# Patient Record
Sex: Female | Born: 1935 | Race: White | Hispanic: No | Smoking: Never smoker
Health system: Southern US, Community
[De-identification: ages and names within clinical notes are randomized; demographics above are authoritative.]

## PROBLEM LIST (undated history)

## (undated) DIAGNOSIS — K6389 Other specified diseases of intestine: Secondary | ICD-10-CM

## (undated) DIAGNOSIS — E039 Hypothyroidism, unspecified: Secondary | ICD-10-CM

## (undated) DIAGNOSIS — N390 Urinary tract infection, site not specified: Secondary | ICD-10-CM

## (undated) DIAGNOSIS — T4145XA Adverse effect of unspecified anesthetic, initial encounter: Secondary | ICD-10-CM

## (undated) DIAGNOSIS — M3500 Sicca syndrome, unspecified: Secondary | ICD-10-CM

## (undated) DIAGNOSIS — M069 Rheumatoid arthritis, unspecified: Secondary | ICD-10-CM

## (undated) DIAGNOSIS — R062 Wheezing: Secondary | ICD-10-CM

## (undated) DIAGNOSIS — R112 Nausea with vomiting, unspecified: Secondary | ICD-10-CM

## (undated) DIAGNOSIS — L292 Pruritus vulvae: Principal | ICD-10-CM

## (undated) DIAGNOSIS — Z9889 Other specified postprocedural states: Secondary | ICD-10-CM

## (undated) DIAGNOSIS — R05 Cough: Secondary | ICD-10-CM

## (undated) DIAGNOSIS — G4733 Obstructive sleep apnea (adult) (pediatric): Secondary | ICD-10-CM

## (undated) DIAGNOSIS — I10 Essential (primary) hypertension: Secondary | ICD-10-CM

## (undated) DIAGNOSIS — K5900 Constipation, unspecified: Secondary | ICD-10-CM

## (undated) DIAGNOSIS — N898 Other specified noninflammatory disorders of vagina: Secondary | ICD-10-CM

## (undated) DIAGNOSIS — G629 Polyneuropathy, unspecified: Secondary | ICD-10-CM

## (undated) DIAGNOSIS — I447 Left bundle-branch block, unspecified: Secondary | ICD-10-CM

## (undated) DIAGNOSIS — N952 Postmenopausal atrophic vaginitis: Secondary | ICD-10-CM

## (undated) DIAGNOSIS — R319 Hematuria, unspecified: Secondary | ICD-10-CM

## (undated) DIAGNOSIS — E785 Hyperlipidemia, unspecified: Secondary | ICD-10-CM

## (undated) DIAGNOSIS — R32 Unspecified urinary incontinence: Secondary | ICD-10-CM

## (undated) DIAGNOSIS — R350 Frequency of micturition: Secondary | ICD-10-CM

## (undated) DIAGNOSIS — K2210 Ulcer of esophagus without bleeding: Secondary | ICD-10-CM

## (undated) DIAGNOSIS — N949 Unspecified condition associated with female genital organs and menstrual cycle: Secondary | ICD-10-CM

## (undated) DIAGNOSIS — K219 Gastro-esophageal reflux disease without esophagitis: Secondary | ICD-10-CM

## (undated) DIAGNOSIS — T8859XA Other complications of anesthesia, initial encounter: Secondary | ICD-10-CM

## (undated) HISTORY — DX: Other specified noninflammatory disorders of vagina: N89.8

## (undated) HISTORY — PX: DILATION AND CURETTAGE OF UTERUS: SHX78

## (undated) HISTORY — DX: Cough: R05

## (undated) HISTORY — PX: TONSILLECTOMY: SUR1361

## (undated) HISTORY — DX: Hyperlipidemia, unspecified: E78.5

## (undated) HISTORY — DX: Rheumatoid arthritis, unspecified: M06.9

## (undated) HISTORY — DX: Left bundle-branch block, unspecified: I44.7

## (undated) HISTORY — DX: Hematuria, unspecified: R31.9

## (undated) HISTORY — DX: Unspecified condition associated with female genital organs and menstrual cycle: N94.9

## (undated) HISTORY — DX: Unspecified urinary incontinence: R32

## (undated) HISTORY — DX: Other specified diseases of intestine: K63.89

## (undated) HISTORY — DX: Wheezing: R06.2

## (undated) HISTORY — DX: Obstructive sleep apnea (adult) (pediatric): G47.33

## (undated) HISTORY — DX: Essential (primary) hypertension: I10

## (undated) HISTORY — DX: Sjogren syndrome, unspecified: M35.00

## (undated) HISTORY — PX: APPENDECTOMY: SHX54

## (undated) HISTORY — DX: Hypothyroidism, unspecified: E03.9

## (undated) HISTORY — DX: Pruritus vulvae: L29.2

## (undated) HISTORY — DX: Urinary tract infection, site not specified: N39.0

## (undated) HISTORY — DX: Polyneuropathy, unspecified: G62.9

## (undated) HISTORY — DX: Postmenopausal atrophic vaginitis: N95.2

## (undated) HISTORY — DX: Frequency of micturition: R35.0

---

## 2001-04-25 ENCOUNTER — Other Ambulatory Visit: Admission: RE | Admit: 2001-04-25 | Discharge: 2001-04-25 | Payer: Self-pay | Admitting: Internal Medicine

## 2001-10-24 ENCOUNTER — Ambulatory Visit (HOSPITAL_COMMUNITY): Admission: RE | Admit: 2001-10-24 | Discharge: 2001-10-24 | Payer: Self-pay | Admitting: Internal Medicine

## 2001-10-24 ENCOUNTER — Encounter: Payer: Self-pay | Admitting: Internal Medicine

## 2001-10-27 ENCOUNTER — Ambulatory Visit (HOSPITAL_COMMUNITY): Admission: RE | Admit: 2001-10-27 | Discharge: 2001-10-27 | Payer: Self-pay | Admitting: Internal Medicine

## 2001-10-27 ENCOUNTER — Encounter: Payer: Self-pay | Admitting: Internal Medicine

## 2002-08-27 ENCOUNTER — Ambulatory Visit (HOSPITAL_COMMUNITY): Admission: RE | Admit: 2002-08-27 | Discharge: 2002-08-27 | Payer: Self-pay | Admitting: Gastroenterology

## 2003-03-14 ENCOUNTER — Emergency Department (HOSPITAL_COMMUNITY): Admission: EM | Admit: 2003-03-14 | Discharge: 2003-03-15 | Payer: Self-pay | Admitting: Internal Medicine

## 2003-08-22 ENCOUNTER — Emergency Department (HOSPITAL_COMMUNITY): Admission: EM | Admit: 2003-08-22 | Discharge: 2003-08-22 | Payer: Self-pay | Admitting: Emergency Medicine

## 2005-02-08 ENCOUNTER — Emergency Department (HOSPITAL_COMMUNITY): Admission: EM | Admit: 2005-02-08 | Discharge: 2005-02-09 | Payer: Self-pay | Admitting: *Deleted

## 2005-08-06 ENCOUNTER — Ambulatory Visit (HOSPITAL_COMMUNITY): Admission: RE | Admit: 2005-08-06 | Discharge: 2005-08-06 | Payer: Self-pay | Admitting: Internal Medicine

## 2006-10-04 ENCOUNTER — Emergency Department (HOSPITAL_COMMUNITY): Admission: EM | Admit: 2006-10-04 | Discharge: 2006-10-04 | Payer: Self-pay | Admitting: Emergency Medicine

## 2006-11-21 ENCOUNTER — Emergency Department (HOSPITAL_COMMUNITY): Admission: EM | Admit: 2006-11-21 | Discharge: 2006-11-21 | Payer: Self-pay | Admitting: Emergency Medicine

## 2007-03-21 ENCOUNTER — Emergency Department (HOSPITAL_COMMUNITY): Admission: EM | Admit: 2007-03-21 | Discharge: 2007-03-21 | Payer: Self-pay | Admitting: Emergency Medicine

## 2007-03-22 ENCOUNTER — Inpatient Hospital Stay (HOSPITAL_COMMUNITY): Admission: EM | Admit: 2007-03-22 | Discharge: 2007-03-24 | Payer: Self-pay | Admitting: Emergency Medicine

## 2007-03-26 ENCOUNTER — Ambulatory Visit (HOSPITAL_COMMUNITY): Admission: RE | Admit: 2007-03-26 | Discharge: 2007-03-26 | Payer: Self-pay | Admitting: Family Medicine

## 2007-07-03 ENCOUNTER — Ambulatory Visit (HOSPITAL_COMMUNITY): Admission: RE | Admit: 2007-07-03 | Discharge: 2007-07-03 | Payer: Self-pay | Admitting: Family Medicine

## 2007-08-28 ENCOUNTER — Encounter (HOSPITAL_COMMUNITY): Admission: RE | Admit: 2007-08-28 | Discharge: 2007-09-16 | Payer: Self-pay | Admitting: Internal Medicine

## 2007-09-22 ENCOUNTER — Ambulatory Visit (HOSPITAL_COMMUNITY): Admission: RE | Admit: 2007-09-22 | Discharge: 2007-09-22 | Payer: Self-pay | Admitting: Internal Medicine

## 2007-12-25 ENCOUNTER — Emergency Department (HOSPITAL_COMMUNITY): Admission: EM | Admit: 2007-12-25 | Discharge: 2007-12-25 | Payer: Self-pay | Admitting: Emergency Medicine

## 2008-04-16 ENCOUNTER — Ambulatory Visit: Payer: Self-pay | Admitting: Internal Medicine

## 2008-05-13 ENCOUNTER — Ambulatory Visit: Payer: Self-pay | Admitting: Gastroenterology

## 2008-05-13 ENCOUNTER — Ambulatory Visit (HOSPITAL_COMMUNITY): Admission: RE | Admit: 2008-05-13 | Discharge: 2008-05-13 | Payer: Self-pay | Admitting: Gastroenterology

## 2008-05-17 ENCOUNTER — Ambulatory Visit: Payer: Self-pay | Admitting: Gastroenterology

## 2008-05-17 ENCOUNTER — Ambulatory Visit (HOSPITAL_COMMUNITY): Admission: RE | Admit: 2008-05-17 | Discharge: 2008-05-17 | Payer: Self-pay | Admitting: Gastroenterology

## 2008-05-17 HISTORY — PX: OTHER SURGICAL HISTORY: SHX169

## 2008-05-26 ENCOUNTER — Ambulatory Visit: Payer: Self-pay | Admitting: Gastroenterology

## 2008-06-07 ENCOUNTER — Encounter: Payer: Self-pay | Admitting: Gastroenterology

## 2008-06-07 ENCOUNTER — Ambulatory Visit (HOSPITAL_COMMUNITY): Admission: RE | Admit: 2008-06-07 | Discharge: 2008-06-07 | Payer: Self-pay | Admitting: Gastroenterology

## 2008-06-07 ENCOUNTER — Ambulatory Visit: Payer: Self-pay | Admitting: Gastroenterology

## 2008-06-07 HISTORY — PX: ESOPHAGOGASTRODUODENOSCOPY: SHX1529

## 2008-06-28 ENCOUNTER — Ambulatory Visit: Payer: Self-pay | Admitting: Gastroenterology

## 2008-06-29 ENCOUNTER — Ambulatory Visit (HOSPITAL_COMMUNITY): Admission: RE | Admit: 2008-06-29 | Discharge: 2008-06-29 | Payer: Self-pay | Admitting: Gastroenterology

## 2008-07-12 ENCOUNTER — Ambulatory Visit (HOSPITAL_COMMUNITY): Admission: RE | Admit: 2008-07-12 | Discharge: 2008-07-12 | Payer: Self-pay | Admitting: Internal Medicine

## 2008-08-26 ENCOUNTER — Ambulatory Visit: Payer: Self-pay | Admitting: Gastroenterology

## 2008-10-19 ENCOUNTER — Ambulatory Visit: Payer: Self-pay | Admitting: Cardiology

## 2008-11-12 ENCOUNTER — Ambulatory Visit (HOSPITAL_COMMUNITY): Admission: RE | Admit: 2008-11-12 | Discharge: 2008-11-12 | Payer: Self-pay | Admitting: Internal Medicine

## 2008-12-23 ENCOUNTER — Ambulatory Visit: Payer: Self-pay | Admitting: Gastroenterology

## 2009-02-14 ENCOUNTER — Emergency Department (HOSPITAL_COMMUNITY): Admission: EM | Admit: 2009-02-14 | Discharge: 2009-02-14 | Payer: Self-pay | Admitting: Emergency Medicine

## 2009-04-03 ENCOUNTER — Emergency Department (HOSPITAL_COMMUNITY): Admission: EM | Admit: 2009-04-03 | Discharge: 2009-04-03 | Payer: Self-pay | Admitting: Emergency Medicine

## 2010-11-24 ENCOUNTER — Ambulatory Visit (HOSPITAL_COMMUNITY)
Admission: RE | Admit: 2010-11-24 | Discharge: 2010-11-24 | Payer: Self-pay | Source: Home / Self Care | Attending: Internal Medicine | Admitting: Internal Medicine

## 2011-01-07 ENCOUNTER — Encounter: Payer: Self-pay | Admitting: Internal Medicine

## 2011-02-06 ENCOUNTER — Ambulatory Visit (HOSPITAL_COMMUNITY)
Admission: RE | Admit: 2011-02-06 | Discharge: 2011-02-06 | Disposition: A | Discharge status: Home / Self Care | Payer: MEDICARE | Source: Ambulatory Visit | Attending: Rheumatology | Admitting: Rheumatology

## 2011-02-06 ENCOUNTER — Other Ambulatory Visit (HOSPITAL_COMMUNITY): Payer: Self-pay | Admitting: Rheumatology

## 2011-02-06 DIAGNOSIS — M7120 Synovial cyst of popliteal space [Baker], unspecified knee: Secondary | ICD-10-CM | POA: Insufficient documentation

## 2011-02-06 DIAGNOSIS — M7989 Other specified soft tissue disorders: Secondary | ICD-10-CM | POA: Insufficient documentation

## 2011-02-06 DIAGNOSIS — M79609 Pain in unspecified limb: Secondary | ICD-10-CM | POA: Insufficient documentation

## 2011-02-11 ENCOUNTER — Emergency Department (HOSPITAL_COMMUNITY)
Admission: EM | Admit: 2011-02-11 | Discharge: 2011-02-11 | Disposition: A | Discharge status: Home / Self Care | Payer: MEDICARE | Attending: Emergency Medicine | Admitting: Emergency Medicine

## 2011-02-11 DIAGNOSIS — E7800 Pure hypercholesterolemia, unspecified: Secondary | ICD-10-CM | POA: Insufficient documentation

## 2011-02-11 DIAGNOSIS — I10 Essential (primary) hypertension: Secondary | ICD-10-CM | POA: Insufficient documentation

## 2011-02-11 DIAGNOSIS — Z79899 Other long term (current) drug therapy: Secondary | ICD-10-CM | POA: Insufficient documentation

## 2011-02-11 LAB — URINALYSIS, ROUTINE W REFLEX MICROSCOPIC
Ketones, ur: NEGATIVE mg/dL
Protein, ur: NEGATIVE mg/dL
Specific Gravity, Urine: 1.015 (ref 1.005–1.030)
Urine Glucose, Fasting: NEGATIVE mg/dL

## 2011-05-01 NOTE — Op Note (Signed)
NAME:  Jo Parrish, Jo Parrish                 ACCOUNT NO.:  [id number]   MEDICAL RECORD NO.:  [id number]          PATIENT TYPE:  AMB   LOCATION:  DAY                           FACILITY:  [facility]   PHYSICIAN:  Kassie Mends, M.D.      DATE OF BIRTH:  1926-06-04   DATE OF PROCEDURE:  06/07/2008  DATE OF DISCHARGE:                               OPERATIVE REPORT   REFERRING PHYSICIAN:  [name]. Fusco, MD   PROCEDURE:  Esophagogastroduodenoscopy with cold forceps biopsy of the  gastric and then duodenal mucosa.   INDICATION FOR EXAMINATION:  Jo Parrish is a 77 year old lady who presents  with persistent nausea.  Her nausea did not respond to Nexium, Carafate,  Prevacid, HyoMax, hydroxyzine or Prilosec.  She had hydrogen breath test  which was consistent with small bowel bacterial overgrowth.  She had  symptomatic improvement with Cipro and Flagyl, but did not have complete  resolution of her symptoms.  She still continues to complain of nausea  and abdominal pain.   FINDINGS:  1. Linear erosions seen in the distal esophagus beginning      approximately at 37 cm from the teeth and extending to the GE      junction at 40 cm from the teeth.  No Barrett mass or stricture      seen.  2. Small hiatal hernia.  Patchy erythema in the antrum without erosion      or ulceration.  Biopsies obtained via cold forceps to evaluate for      H. pylori gastritis or eosinophilic gastritis.  3. Normal duodenal bulb and the second portion of the duodenum.      Biopsies obtained via cold forceps to evaluate for celiac sprue.   DIAGNOSIS:  Jo Parrish epigastric discomfort and nausea or likely  related to esophagitis and gastritis.   RECOMMENDATIONS:  1. We will Kapidex once daily.  2. We will call Jo Parrish with the results of her biopsies.  3. She may resume her previous diet and is given information on reflux      and gastritis.  4. No aspirin or NSAIDs for 5 days.  5. Followup appointment in 1 month with Dr. Cira Servant  regarding nausea.   MEDICATIONS:  1. Demerol 75 mg IV.  2. Versed 3 mg IV.   PROCEDURE TECHNIQUE:  Physical exam was performed.  Informed consent was  obtained from the patient explaining the benefits, risks and  alternatives to the procedure.  The patient was connected to monitor and  placed in the left lateral position.  Continuous oxygen was provided by  nasal cannula, IV medicine administered through an indwelling cannula.  After administration of sedation, the patient's esophagus was intubated  and the scope was advanced under direct visualization to the cecum.  The  scope was removed slowly by carefully examining the color,  texture, anatomy, and integrity of the mucosa on the way out.  The  patient was recovered in endoscopy and discharged home in satisfactory  condition.   PATH:  Gastritis. No celiac sprue      Kassie Mends, M.D.  Electronically Signed     SM/MEDQ  D:  06/07/2008  T:  06/07/2008  Job:  [id number]   cc:   [name], MD  Fax: [phone]

## 2011-05-01 NOTE — Consult Note (Signed)
NAME:  Jo Parrish, Jo Parrish                 ACCOUNT NO.:  [id number]   MEDICAL RECORD NO.:  [id number]          PATIENT TYPE:  AMB   LOCATION:  DAY                           FACILITY:  [facility]   PHYSICIAN:  Kassie Mends, M.D.      DATE OF BIRTH:  Jun 24, 1926   DATE OF CONSULTATION:  05/26/2008  DATE OF DISCHARGE:                                 CONSULTATION   REFERRING PHYSICIAN:  [name], MD.   PROBLEM LIST:  1. Chronic nausea.  2. Chronic salty taste.  3. Probable Sjogren syndrome.  4. Hypertension.  5. Thyroid disease.  6. Appendectomy.   SUBJECTIVE:  Jo Parrish is a 77 year old female who was seen and evaluated  for her nausea in May 2009.  Hydrogen breath test was performed and it  was positive.  She was asked to take Cipro and Flagyl 500 mg b.i.d. for  5 days.  She finished her Augmentin and initially felt well, but  continues to have problems with nausea and that is relieved with  Phenergan or chewing sugarless gum.  Last endoscopy in October 2008 was  by Dr. Karilyn Cota and he did not biopsy her gastric mucosa.  She had a C-  reactive protein in May 2009, which was 0.1.  She presents as a return  patient visit today because she said her nausea is not any better.  She  complained of constipation and took 1 stool softener (docusate sodium)  and it caused severe abdominal cramping.  She also was very nauseous.  She has not had any vomiting.  She complains of feeling bloated in her  abdomen and her stomach feeling hard as a rock.  She also complains of  her tongue being sore and white and thinks she has thrush.   MEDICATIONS:  1. Enalapril.  2. Levothyroxine.  3. Atenolol as needed for palpitations.  4. Phenergan as needed.  5. Alprazolam as needed.  6. Fish oil.  7. Super B-Complex.   OBJECTIVE:  VITAL SIGNS:  Weight 142 pounds, height 5 feet 3 inches,  temperature 98,  blood pressure 100/80, pulse 64.  GENERAL:  She is in no apparent distress.LUNGS:  Clear to auscultation  bilaterally.CARDIOVASCULAR:  Regular rate and rhythm.  No  murmur.ABDOMEN:  Bowel sounds are present and soft.  She has marked her  abdomen with ballpoint pen showing me where her pain is.  She has mild  tenderness to palpation in the left upper quadrant without rebound or  guarding.   ASSESSMENT:  Jo Parrish is a 77 year old female who seems to perseverate  on her nausea and her abdominal discomfort.  The differential diagnosis  for her symptoms still includes bacterial overgrowth, and after her  course of antibiotic therapy, she has not given enough time to determine  whether or not she is going to have a therapeutic effect.  She also has  some dietary issues that may be contributing to her bloating and nausea.  I believe she is very sensitive to the distension of her intestines.  Thank you for allowing me to see Jo Parrish in consultation.  My  recommendations follow.   RECOMMENDATIONS:  1. She may use nystatin 1 mL 4 times a day for 10 days.  She should      swish it and spit it out.  2. May begin Align daily.  She should use Metamucil daily.  3. She is asked to avoid sugarless gum and artificial sweeteners such      as Splenda or Equal.  She is given discharge instructions in      writing.  4. She may use milk of magnesia as needed for constipation.  5. She will be scheduled for an upper endoscopy within the next 2      weeks and we will cancel it if her symptoms are improved after the      initiation of diet modification, Metamucil, and Align.  6. She is given a prescription for Phenergan and asked to take 1/4 to      1 every 6 hours as needed for nausea and vomiting.  She is also      given a hand out on items that may cause bloating.  7. She may follow up to see me in 1 month.      Kassie Mends, M.D.  Electronically Signed     SM/MEDQ  D:  05/26/2008  T:  05/27/2008  Job:  [id number]   cc:   [name], MD  Fax: [phone]

## 2011-05-01 NOTE — Letter (Signed)
October 19, 2008    Jo Rear. Sherwood Gambler, MD  [address]  [place], [place] [address]   RE:  Jo Parrish, Jo Parrish  MRN:  [id number]  /  DOB:  02-25-1926   Dear Peyton Najjar,   [name] was seen in the office today at her request.  As you know, she  was previously followed by [facility], but wish to transfer  her care to our practice.  She reports that she has no known cardiac  disease.  She has been told of a heart murmur.  Testing was performed at  [facility], but those records have not yet been provided to [place].  She  was advised to seek evaluation by a cardiologist by her dermatologist,  Dr. Margo Aye.  She has had ill-defined autoimmune disease in the past.  Evaluation by Dr. Kellie Simmering was apparently negative.  More recently, she  has had GI complaints, worked up here and at [facility] without a specific  etiology being identified.  She was apparently told at [facility] that she  has a motility disorder.  She has had mild hypertension that has been  adequately controlled with a single agent.  She has moderate  hyperlipidemia that initially was controlled with a statin.  More  recently, she has been unable to tolerate multiple drugs including  atorvastatin, simvastatin, and rosuvastatin.  She is afraid to take  ezetimibe.  Nonetheless, she wants her cholesterol to be lowered into  the normal range.   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  The only surgery  she has undergone is a remote appendectomy.   FAMILY HISTORY:  Father died at age 22 with a history of hepatic and  pulmonary problems.  The mother died at age 32 due to CVA.   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  She is widowed  with one child.   REVIEW OF SYSTEMS:  Notable for the need for corrective lenses for near  vision, status post bilateral cataract extraction, history  of minor  palpitations, history of multiple GI complaints including nausea,  abdominal pain, excessive gas, and heartburn.  She has arthritic  discomfort in both knees.  She report some pain in the calves of her  legs, but this does not clearly represent claudication.  All other  systems reviewed and are negative.   PHYSICAL EXAMINATION:  GENERAL:  Garrulous pleasant woman in no acute  distress.  VITAL SIGNS:  The weight is 146, blood pressure 125/70, heart rate 90  and regular, respirations 14.  HEENT:  EOMs full; pupils equal, round, and reactive to light; normal  lids and conjunctivae; normal oral mucosa.  NECK:  No jugular venous distention; normal carotid upstrokes without  bruits.  ENDOCRINE:  No thyromegaly.  HEMATOPOIETIC:  No adenopathy.  SKIN:  No significant lesions.  LUNGS:  Clear.  CARDIAC:  Normal first and second heart sounds; modest systolic ejection  murmur; normal PMI.  ABDOMEN:  Soft and nontender; normal bowel sounds; no masses; no  organomegaly.  EXTREMITIES:  No edema; normal distal pulses.  NEUROLOGIC:  Symmetric strength and tone; normal cranial nerves.   EKG:  Normal sinus rhythm; left bundle-branch block; borderline left  atrial abnormality.   Laboratory available from October reveals a total cholesterol 267,  triglycerides 216, HDL 47, and LDL of 177.  TSH was 0.28.  AM  cortisol  was normal.  CBC was normal.  Sed rate was normal.  Metabolic profile  was normal.   IMPRESSION:  Ms. Deadwyler has a benign sounding systolic ejection murmur.  She has hypertension that is well controlled.  She has moderately severe  hyperlipidemia that will not be controlled as long as she is unable to  take statin medications.  I explained her that at her age, the need to  control hyperlipidemia aggressively is somewhat mitigated.  She will try  ezetimibe  plus dietary measures, including oat bran and margarine with plant  sterols.  We discussed her left bundle-branch block  and the likelihood  that does not reflect significant cardiac disease nor will it have  important implications for her.  I will assemble records from  [facility] and plan to see this nice woman  again in 6 months.    Sincerely,      Gerrit Friends. Dietrich Pates, MD, [facility]  Electronically Signed    RMR/MedQ  DD: 10/19/2008  DT: 10/19/2008  Job #: [id number]

## 2011-05-01 NOTE — Op Note (Signed)
NAME:  Jo Parrish, Jo Parrish                 ACCOUNT NO.:  [id number]   MEDICAL RECORD NO.:  [id number]          PATIENT TYPE:  AMB   LOCATION:  DAY                           FACILITY:  [facility]   PHYSICIAN:  Kassie Mends, M.D.      DATE OF BIRTH:  05/04/1926   DATE OF PROCEDURE:  05/17/2008  DATE OF DISCHARGE:  05/17/2008                               OPERATIVE REPORT   REFERRING Cam Dauphin:  [name]. Fusco, MD   PROCEDURE:  Hydrogen breath test for small bowel bacterial overgrowth.   INDICATION FOR EXAM:  Ms. Gaughran is a 77 year old lady who presented with  nausea, vomiting, and bloating.  She had an upper endoscopy in October  2008 by Dr. Karilyn Cota, which showed a small hiatal hernia.  She had an  extensive GI workup to include a 4-hour gastric emptying study with  [facility], which was normal.  She also  complains of mild constipation and bloating.  Xanax was held with her  symptoms.  She denies diarrhea.  The CT scan of her head was normal.   PRETEST CHECK:  Ms. Sligh presented with nausea and had to be brought to  the testing area in a wheelchair.  She did not have any beans, bran, or  high-fiber within the past 24 hours.  She had been n.p.o. for at least  12 hours.  She denied smoking, sleeping, or exercising within 30 minutes  of the exam.  She was not taking any antibiotics and did not have any  diarrhea.  She stated when the lactose hit her stomach, she got instant  relief from her nausea.   RESULTS:  At 30 minutes, the breathalyzer read 15 parts per million.  At  90 minutes, the breathalyzer read 48 parts per million.  She had a drop  of 36 parts per million at 105 minutes.  She again had a peak at 120  minutes with 43 parts per million and then the test was discontinued at  135 minutes with the breathalyzer measuring 41 parts per million.   DIAGNOSIS:  Hydrogen level rise consistent with small bowel bacterial  overgrowth.   RECOMMENDATIONS:  The patient is  unable to take Keflex.  She also states  that Augmentin causes her tongue to hurt.  She was given a prescription  for Cipro 500 mg b.i.d. and Flagyl 500 mg b.i.d. for 5 days.  She will  call me and let me know if her symptoms are improved.  Ms. Seedorf already  has a followup appointment to see me in 2 months.      Kassie Mends, M.D.  Electronically Signed    SM/MEDQ  D:  05/20/2008  T:  05/21/2008  Job:  [id number]   cc:   [name], MD  Fax: [phone]

## 2011-05-01 NOTE — Assessment & Plan Note (Signed)
NAME:  Jo Parrish, Jo Parrish                  CHART#:  [id number]   DATE:  06/28/2008                       DOB:  September 15, 1926   REFERRING PHYSICIAN:  [name], MD   PRIMARY CARDIOLOGIST:  Dani Gobble, MD   PROBLEM LIST:  1. Persistent nausea  2. Hyperlipidemia.  3. Hydrogen breath test in June 2009, which was consistent with small      bowel bacterial overgrowth.  4. Mild gastritis on biopsies via EGD in June 2009.  5. Hypertension.   SUBJECTIVE:  The patient is a 77 year old female who was initially seen  by me in May 2009.  She was complaining of nausea and tongue pain.  She  has had an extensive evaluation, which has revealed no specific etiology  for her nausea.  She additionally stated that she had some improvement  with her symptoms after taking Cipro and Flagyl.  After she was given a  repeat course in July 2009, stated it made her sick.  She states she  cannot take Reglan because that makes her sicker.  She states she cannot  take Elavil because it makes her drowsy.  Her insurance company would  not approve Kapidex and told her to take omeprazole.  Xanax may have  helped.  Kenalog set off an episode of nausea yesterday.  She thinks  Lipitor did this to me.  She complains of decreased appetite, but her  weight has been stable since 1985.   MEDICATIONS:  1. Enalapril.  2. Levothyroxine.  3. Atenolol.  4. Phenergan as needed.  5. Xanax as needed.  6. Fish oil 1200 mg 4 times a day.  7. Mylanta as needed.   OBJECTIVE:  VITAL SIGNS:  Weight 141 pounds, height 5 feet 3 inches, BMI  25 (healthy), temperature is 97.1, blood pressure 130/80, and pulse  72.GENERAL:  She is in no apparent distress.  Alert and oriented  x4.HEENT:  Atraumatic and normocephalic.  Pupils, equal and reactive to  light.MOUTH:  No oral lesions.  Posterior pharynx without erythema or  exudate.LUNGS:  Clear to auscultation bilaterally.CARDIAC:  Regular  rhythm with no murmur.NECK:  Full range of  motion.  No lymphadenopathy.  ABDOMEN:  Bowel sounds are present, soft, and mild tenderness to  palpation in the bilateral flanks without rebound or  guarding.NEUROLOGIC:  She has no focal neurological deficits.   ASSESSMENT:  The patient is a 77 year old female who has persistent  nausea and mild abdominal pain.  She likely has a functional  gastroesophageal reflux disease disorder, but the differential diagnosis  includes atypical mesenteric ischemia.  Thank you for allowing me to see  the patient in consultation.  My recommendations follow.   RECOMMENDATIONS:  1. The patient declined a referral to [facility] for      additional evaluation.  2. Will add imipramine 10 mg nightly.  Hopefully, she can tolerate      this dose.  3. She is encouraged to use the Xanax as often as she needs it.  4. Will obtain a CT angiography of the abdomen to evaluate for celiac      axis or superior mesenteric artery axis stenosis as an etiology for      her persistent nausea.  5. She has a return patient visit to see me in 1 month.  PATH:  CTA-no stenois of Celiac, SMA, or IMA axis.       Kassie Mends, M.D.  Electronically Signed     SM/MEDQ  D:  06/28/2008  T:  06/29/2008  Job:  [id number]   cc:   Dani Gobble, MD  [name], MD

## 2011-05-01 NOTE — Assessment & Plan Note (Signed)
NAME:  Jo Parrish, Jo Parrish                  CHART#:  [id number]   DATE:  08/26/2008                       DOB:  07-03-1926   REFERRING PHYSICIAN:  [name], MD.   PRIMARY CARDIOLOGIST:  Dani Gobble, MD.   PROBLEM LIST:  1. Persistent nausea.  2. Hyperlipidemia.  3. Hydrogen breath test in June 2009 consistent with small bowel      bacterial overgrowth.  4. Mild gastritis on EGD in June 2009.  5. Hypertension.   SUBJECTIVE:  The patient is an 77 year old female who came to me  complaining of persistent nausea.  She has had an extensive workup to  include CTA, which showed no evidence of stenosis of the SMA, IMA, or  celiac axis.  She continues to complain of nausea when she takes any  medications that I prescribed.  She has been tried on many medicines.  She said Reglan made her sicker.  Cipro and Flagyl initially worked, and  then made her ill.  She felt like omeprazole did not help.  Xanax does  help.  She thinks Lipitor caused all of her problems.  She said she felt  good for 2 weeks in August.  On August 09, 2008, she became sick.  When  her stomach rolls she takes Xanax.  Her nausea does not seem to be  related to what she eats.  She also marked her stomach where she feels  pain with an ink pen.   MEDICATIONS:  1. Enalapril.  2. Levothyroxine.  3. Atenolol.  4. Xanax as needed.   OBJECTIVE:  PHYSICAL EXAM:  VITAL SIGNS:  Weight 142 pounds (unchanged since May 2009), height 5  feet 3 inches, temperature 97.5, blood pressure 126/72, and pulse  76.GENERAL:  She is in no apparent distress.  Alert and oriented  x4.LUNGS:  Clear to auscultation bilaterally.CARDIOVASCULAR:  Regular  rhythm.ABDOMEN:  Bowel sounds are present.  Soft, nontender, and  nondistended.  She does have itch on her abdomen.   ASSESSMENT:  The patient is an 77 year old female who presents as a  return patient visit.  She continues to complain of persistent nausea,  but is unable to take medications  to help with her symptoms.  She used  to take those medications, which caused her symptoms to be worse.  Thank  you for allowing me to see the patient in consultation.  My  recommendations follow.   RECOMMENDATIONS:  1. Recommend the patient to try her Zofran as needed for nausea.  I      really did not have any other workup to offer her today or any      other medications to offer.  2. She has a follow up appointment to see me in 3 months.  She can see      me sooner if she needs to.  3. I recommend that she is to see Dr. Kellie Simmering for her knee pain.  She      also was concerned as to whether or not she could have amyloidosis.      She does not have any signs or symptoms of amyloidosis.        Kassie Mends, M.D.  Electronically Signed     SM/MEDQ  D:  08/26/2008  T:  08/27/2008  Job:  [id number]   cc:  [name], MD

## 2011-05-01 NOTE — Assessment & Plan Note (Signed)
NAME:  Jo Parrish, Jo Parrish                  CHART#:  [id number]   DATE:  05/13/2008                       DOB:  1926/09/07   REFERRING PHYSICIAN:  [name], M.D.   PROBLEM LIST:  1. Chronic nausea.  2. Chronic tongue pain, thought possibly secondary to Sjogren's      syndrome.  3. Hypertension.  4. Hypothyroidism.  5. Hyperlipidemia.  6. Appendectomy.   SUBJECTIVE:  Jo Parrish is a 77 year old female who was initially seen in  May 2009 by Tana Coast.  Several records were requested for review due  to the patient's complaint of nausea and vomiting, which has been  episodic.  She reports having these symptoms since February 2008.  She  remembers the first episode happened, and then she was sent to  [facility] for a PET scan, and she said she was cancer-free.  Then she  had an episode were she had nausea, and it was really bad.  In August  2008 her symptoms recurred.  She also had an episode when she was in  [place] after she ate cheesecake and she was treated for her symptoms  with Phenergan and then the next day she could not eat.  In January 2009  the same thing, she had the nausea and the vomiting.  This time she was  in [facility], and they gave her a shot and she got better.  She has  also been seen at [facility] and had  an endoscopic ultrasound as well as a 4-hour gastric emptying study,  which was normal.  She was also seen and evaluated by Dr. Dionicia Abler in  October 2008, and he did an EGD, which was absolutely normal.  He asked  her to take Nexium and Carafate, which she said did not help her  symptoms.  Also her Lipitor had been discontinued because she thought  perhaps that was contributing to her nausea.  She had an ultrasound in  January 2009, which was normal.  She had an upper GI with small-bowel  follow-through in October 2008, which just showed a small hiatal hernia.  She had a HIDA scan at [facility] within the last year, which  showed a  gallbladder ejection fraction of 57.9%.  She was seen in followup with  Dr. Dionicia Abler in January 2009 and Dr. Dionicia Abler felt that these were  psychosomatic symptoms.  He gave her Xanax.  The CT scan of her abdomen  and pelvis that she had in August 2008 just showed mild uncomplicated  diverticulosis.  The films are not available for me to review.  When she  was seen by [place] in May, she had a liver profile which was normal and an  a.m. cortisol level which was normal.  She had a normal TSH in February  2009.  She had a colonoscopy for screening purposes in 2003 with Dr.  Griffith Citron in [place].   She has been tried on Prilosec, hydroxyzine, HyoMax, Carafate, and  Prevacid without any change in her symptoms.  She states that she since  she has been on the Xanax the attacks are further apart.  She denies  that her symptoms correlate with anxiety.  She has had palpitations in  the past.  She is scared to eat because she might get  sick.  Her weight  was 142 to 143 pounds in 2000 and measured at 150 pounds in 2007.  Today  she is 143 pounds.  She denies any vertigo, disequilibrium, posterior  nasal drainage, headaches, hearing loss, pain in her ears or any  spinning sensation when she has the nausea and the vomiting.  She said  she has had some mild change in her vision in July 2008.  She also says  when she has these symptoms she gets ringing in her ears.  She has not  had a hearing test.  Her last eye exam was at least 2 years ago.  Sometimes she gets pressure in the back of her eyes, which are mild  sinus symptoms.  She also has some mild nasal congestion.  She denies  any heartburn, indigestion, problems swallowing, rectal bleeding,  diarrhea.  She has some mild constipation and bloating.  She said  laxatives make her sick just like when she has her episodes of nausea  and vomiting.  When she has these episodes of nausea and vomiting she  does have the urge to defecate but does  not.  The symptoms hit her all  of a sudden and usually last 4 to 6  hours.  She said until she started  the Xanax her symptoms were basically all day.  She denies eating any  milk or cheese.  She has never had a history of cerebrovascular accident  or myocardial infarction. She denies any history of tobacco use ever or  alcohol use.   MEDICATIONS:  1. Enalapril.  2. Levothyroxine.  3. Atenolol as needed for palpitation.  4. Phenergan as needed (less than once a month).  5. Alprazolam 0.25 mg t.i.d. (using 1 time a week).  6. Fish oil.  7. Super B complex.   OBJECTIVE:  VITAL SIGNS:  BMI 25.3 (slightly overweight), temperature  97.8,  blood pressure 120/90, pulse 76.  GENERAL:  She is in no apparent  distress.  Alert and oriented x4.  She is very talkative.  HEENT:  Atraumatic, normocephalic.  Pupils equal and reactive to light.  Mouth:  No oral lesions.  Posterior pharynx without erythema or exudate.  NECK:  Full range of motion, no lymphadenopathy.  LUNGS:  Clear to auscultation  bilaterally.  CARDIOVASCULAR:  Regular rhythm.  No murmur.ABDOMEN:  Bowel sounds are present, soft, nontender, nondistended, no abdominal  bruits.  EXTREMITIES:  No cyanosis or edema.  NEUROLOGIC:  She has no  focal deficits.   ASSESSMENT:  Jo Parrish is a 77 year old female who complains of episodic  nausea and vomiting.  She has already had an extensive negative workup.  The things that have not been evaluated, that remain in the differential  diagnosis include atypical gastroesophageal reflux disease, small bowel  bacterial overgrowth causing bowel distention and vomiting, an acute  intracranial process, and a low likelihood of chronic mesenteric  ischemia, or intermittent small bowel obstruction.  She may very well  have a psychosomatic component to her symptoms.   Thank you for allowing me to see Jo Parrish in consultation.  My  recommendations follow.   RECOMMENDATIONS:  1. Jo Parrish will be  scheduled for a hydrogen breath test to check for      small bowel bacterial overgrowth.  She will also be scheduled for a      CT of her head.  If the CT and HBT are negative, then would      schedule a CT angiogram to  look at the vasculature of her abdomen.      If that is negative, then I would proceed with a small bowel CT      enterography.  If all of those tests are negative, then there would      be no further GI evaluation tools to evaluate her for nausea and      vomiting, and I would recommend a referral to a tertiary care      center.  2. She is asked to follow a low-fat diet.  3. She has a follow up appointment to see me in 2 months.  I will      contact her by phone in regards to her hydrogen breath test, her CT      scan and subsequent evaluation.  4. Once the hydrogen breath test and the CT scan of the head are      complete, then we consider Kapidex.  She will be given samples for      a 30-day supply.  5. She is asked to continue the use of Xanax since she has had some      symptomatic relief from that.   ADDENDUM: [id number]:  Scheduled for EGD with biopsies. Pt did not show.       Kassie Mends, M.D.  Electronically Signed     SM/MEDQ  D:  05/13/2008  T:  05/13/2008  Job:  [id number]   cc:   [name], MD

## 2011-05-01 NOTE — Assessment & Plan Note (Signed)
NAME:  Jo Parrish, Jo Parrish                  CHART#:  [id number]   DATE:  04/16/2008                       DOB:  1926/11/08   CHIEF COMPLAINT:  Chronic nausea and abdominal pain.   PRIMARY CARE PHYSICIAN:  [name], MD   HISTORY OF PRESENT ILLNESS:  Ms. Jo Parrish is a 77 year old Caucasian female  who presents today as a self-referral for chronic nausea.  Notably this  patient has been seen here locally by Dr. Lionel December since October of  2008 and has been seen at [facility] at least for a  couple of tests.  I am not sure if she has been seen in the GI clinic  however.  She states that she has had chronic nausea and epigastric  discomfort for several months.  She relates to beginnings of these  symptoms when she developed nausea, vomiting, diarrhea when she was in  [facility].  This was back around August of last year.  She states she  was told she had vertigo when she visited a local facility there.  She  states her vomiting and diarrhea had resolved spontaneously but she  continues to have epigastric pain and chronic nausea and inability to  eat.  She has tried multiple medications and had multiple studies with  really no real benefit.  She continues to have a growling type pain in  her stomach.  She states she has nausea intermittently.  She states she  actually has felt well the last couple of days.  She denies any change  in her bowel movements.  She generally has regular stools without blood  or diarrhea.  She consumes a high fiber diet which keeps her regular.  She says she had about an 8 pound weight loss since December of 2008  which she relates to the fact that she feels like she cannot eat.  Her  symptoms do seem to be worse postprandially.  She has tried Prilosec,  Tums, Nexium, Prevacid all without any relief.  She tried holding her  Lipitor and Crestor.  She states she was on Lipitor for years.  She  feels like it has really messed her up.  She has  not really noticed any  improvement of her symptoms with holding the medications.  She has tried  sucralfate tablets, hydroxyzine, HyoMax all without results.  Recently  she saw her PCP, Dr. Sherwood Gambler, and was given Reglan and she states after  taking one 5 mg tablet she had extreme bloating and trapped gas and just  felt miserable and she states she will never take it again.  She has  used intermittent Phenergan with relief.  She denies any dysphagia or  odynophagia.  She states that she does have a chronic problem with her  tongue.  At times it feels like it is very sore.  She states she has a  salty taste at all times.  She recalls that over 30 years ago that she  was told she has Sjogren's syndrome as well as rheumatoid arthritis and  lupus when she was seen at [facility].  She states she subsequently was seen at [facility] and by Dr. Kellie Simmering  most recently and states the last set of blood work was negative.  She  says she never  really underwent any treatment for it.  She states she  was told she had a mild case.  She has had problems with her tongue with  a salty taste and soreness ever since that time.  She had a colonoscopy  by Dr. Kinnie Scales in September of 2003 which was negative.  She had an  abdominal ultrasound in January of 2009 negative.  HIDA scan September  of 2008 which was normal with EF of 57.9%.  The patient reports no  aggravation or reproduction of her symptoms.  She had upper GI series  with small bowel follow-through October of 2008 which was normal except  for a small hiatal hernia and age related impairment of esophageal  motility.  She reports having abdominal pelvic CT but I have not found  that report.  She had a gastric emptying study 4 hours, done at [facility] within the last couple of  months which revealed an accelerated 4 hour study.  She had an EUS with  by Dr. Othelia Pulling in February of 2009 at  [facility] which was normal.  She  had electrogastrogram by Dr. 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  She has been Hemoccult negative back  in September of 2008 by Dr. Stacie Glaze.  Labs in January of 2009 revealed an  SGOT of 53, SGPT of 62, possibly on a statin at that time.  Her  creatinine was normal at 0.8, hemoglobin normal at 13.7.  White count  6400, platelets 236,000.  She was treated for a mild UTI because of her  complaints of dysuria.  She had a TSH level in February of 2009 which  was normal at 0.847.   CURRENT MEDICATIONS:  1. Enalapril 10 mg daily.  2. Levothyroxine 75 mcg daily  3. Atenolol 25 mg p.r.n. but not taking currently.  4. Phenergan p.r.n.   ALLERGIES:  Antibiotics in general causes her mouth to be sore.   PAST MEDICAL HISTORY:  Hypertension, hypothyroidism,  hypercholesterolemia.  She reports a history of Sjogren's syndrome,  rheumatoid arthritis and lupus diagnosed years ago but has not been on  any treatment and states that followup workup by Dr. Kellie Simmering in  [place] was negative.  She has had a prior appendectomy, colonoscopy  as above, EGD as above.   FAMILY HISTORY:  Mother deceased at age 62, has history of stroke.  Father deceased age 18, died of liver and lung problems and states he  just give out.  No family history of colorectal cancer.   SOCIAL HISTORY:  She is widowed.  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  She  has never smoked cigarettes, does not drink alcohol.  She has 1 child.   REVIEW OF SYSTEMS:  See HPI for GI and constitutional.  Cardiopulmonary:  Denies chest pain or shortness of breath.  GI:  See HPI.  GU:  Denies  any dysuria or hematuria.   PHYSICAL EXAMINATION:  VITAL SIGNS:  Weight 143, height 5 feet 3 inches,  temperature 98.1, blood pressure 110/68, pulse 64.  GENERAL:  A pleasant, elderly Caucasian female in no acute distress.   SKIN:  Warm and dry.  No jaundice.  HEENT:  Sclerae nonicteric.  Oropharyngeal mucosa moist and pink.  No  lesions, erythema or exudate.  No lymphadenopathy or thyromegaly.  CHEST:  Lungs are clear to auscultation.  CARDIOVASCULAR:  Reveals regular rate and rhythm.  Normal S1-S2.  No  murmurs, rubs or gallops.  ABDOMEN:  Positive bowel sounds.  Abdomen soft, obese, nontender,  nondistended.  No organomegaly or masses.  No rebound or guarding.  No  abdominal bruits or hernias.  LOWER EXTREMITIES:  No edema.   LABS AND X-RAYS:  As above.   IMPRESSION:  Ms. Tamargo is a 81-year lady who has had an extensive  evaluation as outlined above who presents today as a self-referral for  chronic ongoing nausea and epigastric discomfort.  She has tried  multiple proton pump inhibitors without relief.  She has tried  withholding certain medications without relief.  She tells me today that  she believes her symptoms are all related to greasy, fatty foods which  she has held the last 2 days and has felt great.  I am afraid that this  may be a short lived improvement in which she has demonstrated  intermittent bouts of feeling well in the past.  Unfortunately she has  not taken certain suggested treatments for any significant length of  time.  She refuses to try Reglan after one dose stating that it made her  feel worse.  She has not tried Xanax which was given to her previously  as it was felt that some of her symptoms may be psychogenic.  I am still  trying to complete her medical database and retrieve a couple more  records that we do not have for our review.  Unfortunately I am not sure  that we are going to have much more that we can add at this point as a  cause of her nausea and epigastric discomfort.   PLAN:  1. We need to obtain a copy of her CT of abdomen and pelvis for      further review.  2. Retrieve records from Dr. Kellie Simmering specifically regarding her      Sjogren's syndrome.  3. Will  repeat her LFTs given that they were elevated previously.  4. Will check a fasting a.m. cortisol level.  5. No medical regimen change at this time.  6. She will need to have followup with Dr. Kassie Mends in the next few      weeks.       Tana Coast, P.A.  Electronically Signed     R. Roetta Sessions, M.D.  Electronically Signed    LL/MEDQ  D:  04/16/2008  T:  04/16/2008  Job:  [id number]   cc:   [name], MD

## 2011-05-01 NOTE — Assessment & Plan Note (Signed)
NAME:  Jo Parrish, Jo Parrish                  CHART#:  [id number]   DATE:  12/23/2008                       DOB:  July 03, 1926   REFERRING PHYSICIAN:  [name], MD   PRIMARY CARDIOLOGIST:  Gerrit Friends. Dietrich Pates, MD, [facility]   PROBLEM LIST:  1. Persistent nausea and abdominal complaints likely secondary to      functional gut disorder.  2. Hyperlipidemia.  3. Hydrogen breath test consistent with small-bowel bacterial      overgrowth.  4. Mild gastritis on EGD in June 2009.  5. Hypertension.   SUBJECTIVE:  The patient is a 77 year old female who was seen in  September 2009.  She was complaining of consistent nausea, but is unable  to take any medications.  She stated that the medications that I have  tried made symptoms worse.  I recommended Zofran.  She is supposed to  follow up with me in 3 months.  She continues to believe that she has  Sjogren syndrome.  She spoke with Dr. Nita Sells who ordered a B12,  folate, Sjogren A, Sjogren B as well as a blood sugar.  She has been  complaining of feeling like she is going to pass out if she does not  eat.  She is also complaining of feeling off balance when she had been  dizzy.  She states cheese and sweets are not good for her.  She is on  oxygen at nighttime.  She has just finished Cipro for a bladder  infection.  She said it made her mouth burn and then she got thrush and  then she got yeast.  She has tried to take a vitamin B and folate  complex, but she said it made her sick and nauseous.  The Xanax helps  her symptoms.  She is using 3-4 a day.  Her weight has increased 8  pounds since September 2009.  She is avoiding Splenda.  She is able to  eat oatmeal, biscuits, and eggs.  She has 1 bowel movement a day.  She  states she cannot take a stool softener with a laxative because it makes  her stomach cramp.  She does not remember to use the Zofran.   MEDICATIONS:  1. Enalapril.  2. Levothyroxine  3. Atenolol as needed.  4. Xanax as  needed.  5. Zetia.  6. Lidocaine.  7. Tums.   SUBJECTIVE:  VITAL SIGNS:  Weight 150 pounds, height 5 feet 3 inches,  BMI 26.6 (slightly overweight), temperature 98, blood pressure 130/74,  and pulse 80.GENERAL:  She is in no apparent distress.  Alert and  oriented x4.HEENT:  Atraumatic, normocephalic.  Pupils equal and react  to light.  Mouth. no oral lesions.  Posterior pharynx without erythema  or exudate.  The patient reports she has a lesion on the anterior  portion of her tongue, but I was unable to appreciate any abnormality.  NECK:  Full range of motion.  No lymphadenopathy.LUNGS:  Clear to  auscultation bilaterally.CARDIOVASCULAR:  Regular rhythm.  No murmur.  Normal S1 and S2.ABDOMEN:  Bowel sounds are present, soft, nondistended.  No abdominal bruits.  She has again ink marks on her abdomen where she  feels discomfort.  She has mild tenderness to palpation in the left  upper quadrant and left lower quadrant without rebound or  guarding.NEUROLOGIC:  She has no focal deficits.   LABORATORY DATA:  Cortisol level on October 2009 showed a morning  cortisol 7 (4.3 - 22.4) and p.m. cortisol level 6.6 (3.1 - 17.3).   ASSESSMENT:  The patient is an 77 year old female who has multiple  functional complaints, but no etiology for complaints except for small-  bowel bacterial overgrowth.  She was unable to take any of the  antibiotics to make her symptoms improved, if they are related to her  small-bowel bacterial overgrowth.  She understands that the  gastrointestinal workup has been exhausted and no additional treatment  modalities or diagnostic modalities are available.  Thank you for  allowing me to see the patient in consultation.  My recommendations  follow.   RECOMMENDATIONS:  She may return to see me in 3 months.  I would be more  than happy to follow her for her functional complaints.       Kassie Mends, M.D.  Electronically Signed     SM/MEDQ  D:  12/23/2008  T:   12/24/2008  Job:  [id number]   cc:   [name], MD  Gerrit Friends. Dietrich Pates, MD, [facility]

## 2011-05-04 NOTE — H&P (Signed)
NAME:  Jo Parrish, Jo Parrish NO.:  [id number]   MEDICAL RECORD NO.:  [id number]          PATIENT TYPE:  INP   LOCATION:  A213                          FACILITY:  [facility]   PHYSICIAN:  [name], MD  DATE OF BIRTH:  [birth year]   DATE OF ADMISSION:  03/22/2007  DATE OF DISCHARGE:  [facility]                              HISTORY & PHYSICAL   CHIEF COMPLAINT:  Cough.   HISTORY OF PRESENT ILLNESS:  The patient made 2 Emergency Department  visits.  Her first one was after approximately 4 days of malaise,  intermittent cough and a polymyalgia and chills.  She did not have true  rigors.  She had no hemoptysis, palpitations or syncope.  She  specifically denied pleuritic chest pain, specifically denied any marked  sputum production.  She went to the emergency room and was evaluated  there, found to have a normal white count and was sent home on  Zithromax.  Over the ensuing 24 hours, she developed post-medication  nausea and some epigastric abdominal pain suggesting intolerance to oral  Zithromax.  She felt worse with the nausea and her chills got worse and  her malaise got worse and she re-presented to the Emergency Department  the day of admission.  She continued with her symptoms as outlined  above.  Again, she had no hemoptysis, palpitations or syncope and no  pleuritic chest pain.  She denied any shortness of breath.  No wheezing.   PAST MEDICAL HISTORY:  1. Hyperlipidemia.  2. Hypothyroidism.  3. Hypertension.   SOCIAL HISTORY:  Non-smoker, non-drinker, no illicit drug use.   FAMILY HISTORY:  Noncontributory.   REVIEW OF SYSTEMS:  HEAD/NECK:  No headache, blurred vision, double  vision, no hearing change or tinnitus, no dysphagia, odynophagia, no  sinus drainage.  CARDIOPULMONARY:  As under HPI in detail.  GI:  As  under HPI in detail, specifically no hematemesis, hematochezia.  She did  endorse constipation since the beginning of this incident.  No vomiting,  just  nausea.  EXTREMITIES:  No swelling or pain or weakness.  ALLERGY/IMMUNOLOGIC:  No skin rash.  No tick exposure, etc.  LYMPHATIC:  No swollen glands.  PSYCHIATRIC:  No change in mentation.  No depression  symptoms.   PHYSICAL EXAMINATION:  GENERAL:  Awake, alert, cooperative.  HEAD/NECK:  No JVD or adenopathy.  Neck is supple.  CHEST:  Actually clear except for scattered rhonchi and cough.  No  wheezing.  CARDIOVASCULAR:  Regular rhythm, tachycardic, no gallop or rub.  ABDOMEN:  Soft.  No organomegaly, masses.  EXTREMITIES:  No clubbing, cyanosis or edema.  NEUROLOGIC:  Nonfocal.  She ambulates well without assistance.   LABS:  Chest x-ray was reviewed.  Radiologist reads a left lower lobe  infiltrate, which I frankly do not appreciate.  I also do not hear rales  on exam as mentioned above.  Her white count is normal with slight left shift, otherwise, her  metabolic studies are unremarkable.  Blood cultures were not obtained in the Emergency Department in spite of  antibiotic usage.   IMPRESSION:  1. Question pneumonia.  This could be atypical/viral not appreciated      on the chest x-ray very well.  Will attempt to get some sputum for      analysis and continue atypical pneumonitis coverage.  The      differential includes viral pneumonia, post-influenza pneumonia and      of course, very low on the list, tuberculosis, especially with the      left lower lobe being suspect by radiology.  Will get a CT scan of      her chest to insure we are not seeing atypical presentation of      pulmonary embolus.  Will also further delineate if it truly is an      infiltrate there.  2. Hypertension.  Well managed at present.  Intervene as needed.  3. Hypothyroidism.  Continue her oral supplementation.  Check her      labs.  4. Hyperlipidemia.  No acute intervention necessary for that at this      time.      [name], MD  Electronically Signed     LJF/MEDQ  D:  03/22/2007  T:   03/22/2007  Job:  [id number]

## 2011-05-04 NOTE — Discharge Summary (Signed)
NAME:  Jo Parrish, Jo Parrish NO.:  [id number]   MEDICAL RECORD NO.:  [id number]          PATIENT TYPE:  INP   LOCATION:  A213                          FACILITY:  [facility]   PHYSICIAN:  [name], MD  DATE OF BIRTH:  09-24-1926   DATE OF ADMISSION:  03/22/2007  DATE OF DISCHARGE:  04/07/2008LH                               DISCHARGE SUMMARY   DISCHARGE MEDICATIONS:  1. Levaquin 750 mg p.o. q. day times 5 days.  2. Medrol Dosepak.  3. Albuterol HFA 2 puffs q. 6 hours.  4. Mucinex 2 teaspoons q.i.d.  5. Synthroid 75 mcg q. day.  6. Lipitor 20 mg p.o. q.d.  7. Avapro 300 mg p.o. q.d.   DISCHARGE DIAGNOSES:  1. Pneumonia.  2. Hypertension.  3. Hypothyroidism.  4. Hyperlipidemia.   SUMMARY:  The patient was admitted with cough and increasing difficulty  with polymyalgia and chills.  She did not have true rigors.  She had  scant sputum production.  She was evaluated in the emergency room prior  to admission and found to have a normal white count and sent home on  Zithromax, which failed to improve her symptoms.  Notably, there was a  left lower lobe infiltrate on chest x-ray.  She was admitted with  parenteral antibiotics and bronchodilators resulting in a gratifying  response and discharged back to her baseline status for follow up with  her pulmonologist as well as in our office one week post discharge.      [name], MD  Electronically Signed     LJF/MEDQ  D:  04/15/2007  T:  04/15/2007  Job:  [id number]

## 2011-12-04 ENCOUNTER — Encounter: Payer: Self-pay | Admitting: Cardiology

## 2012-03-02 ENCOUNTER — Emergency Department (INDEPENDENT_AMBULATORY_CARE_PROVIDER_SITE_OTHER): Payer: Medicare Other

## 2012-03-02 ENCOUNTER — Emergency Department (INDEPENDENT_AMBULATORY_CARE_PROVIDER_SITE_OTHER)
Admission: EM | Admit: 2012-03-02 | Discharge: 2012-03-02 | Disposition: A | Discharge status: Home / Self Care | Payer: Medicare Other | Source: Home / Self Care | Attending: Family Medicine | Admitting: Family Medicine

## 2012-03-02 ENCOUNTER — Encounter (HOSPITAL_COMMUNITY): Payer: Self-pay

## 2012-03-02 DIAGNOSIS — S52599A Other fractures of lower end of unspecified radius, initial encounter for closed fracture: Secondary | ICD-10-CM

## 2012-03-02 DIAGNOSIS — S52509A Unspecified fracture of the lower end of unspecified radius, initial encounter for closed fracture: Secondary | ICD-10-CM

## 2012-03-02 NOTE — ED Notes (Signed)
Pt fell at 1430 yesterday and landed on lt arm, swollen, bruised and painful lt forearm and elbow.  Ice applied,  Bruise to upper arm and pt states it is from a door hitting her yesterday prior to fall.

## 2012-03-02 NOTE — Discharge Instructions (Signed)
Use ibuprofen if needed for pain as directed on the bottle.  Follow up with an orthopedist.  Call Monday for a follow up appointment.  If you can't get an appointment with Dr. Romeo Apple in [place], call Dr. Shelle Iron here in [place].    Wrist Fracture A fracture and a broken bone mean basically the same thing. A fractured wrist is often due to a fall on an outstretched hand. The most important part of treatment is immobilizing the wrist, and sometimes the elbow. You will be in a splint or cast for 4-6 weeks. If there is significant damage to the wrist, this must be corrected in order to ensure proper healing. Minor fractures without deformity do not requiresetting the bone (reduction) to heal correctly.  HOME CARE INSTRUCTIONS   Do not remove your splint or cast that has been applied to treat your injury.   If there is itching inside of the splint/cast area, DO NOT put anything between the splint/cast and skin to scratch the area. This can cause injury.   Keep your wrist elevated on pillows for 3-4 days to reduce swelling. A sling may also be used to help rest your injury.   Place ice packs over the wrist splint or cast for the next 2-3 days to help control pain and swelling.   Pain and anti-inflammatory medicine may be needed for the next few days.   Call your caregiver to arrange follow-up within the next few days as recommended.  SEEK MEDICAL CARE IF:   You have increased pain or pressure around your injury, or if your hand becomes numb, cold, or pale.   You have loss of movement of the thumb or fingers.   You develop worsening swelling of your hand, fingers, and thumb.   Your splint/cast gets damaged or comes off.  MAKE SURE YOU:   Understand these instructions.   Will watch your condition.   Will get help right away if you are not doing well or get worse.  Document Released: 12/03/2005 Document Revised: 11/22/2011 Document Reviewed: 05/31/2007 [facility] Patient Information  2012 [place], [place].

## 2012-03-02 NOTE — ED Provider Notes (Signed)
History     CSN: [id number]  Arrival date & time 03/02/12  1414   First MD Initiated Contact with Patient 03/02/12 1523      Chief Complaint  Patient presents with  . Arm Pain    (Consider location/radiation/quality/duration/timing/severity/associated sxs/prior treatment) HPI Comments: Pt fell yesterday while getting mail.  Felt pain in RLE from an injection earlier that day and fell over to the L side.  Landed on L side, c/o pain and bruising to L wrist and elbow.  Denies LOC.  Denies pain or injury to any other site.   Patient is a 78 y.o. female presenting with arm pain. The history is provided by the patient.  Arm Pain This is a new problem. The current episode started yesterday. The problem occurs constantly. The problem has not changed since onset.Exacerbated by: activity, movement. The symptoms are relieved by nothing. Treatments tried: ibuprofen. The treatment provided mild relief.    Past Medical History  Diagnosis Date  . Nausea     Persistent  . Hyperlipidemia   . Gastritis     Mild  . Hypertension   . Bacterial overgrowth syndrome     Small bowel  . Sjogren's syndrome   . Rheumatoid arthritis   . Chronic abdominal pain   . Hypothyroidism     Past Surgical History  Procedure Date  . Appendectomy     History reviewed. No pertinent family history.  History  Substance Use Topics  . Smoking status: Never Smoker   . Smokeless tobacco: Not on file  . Alcohol Use: No    OB History    Grav Para Term Preterm Abortions TAB SAB Ect Mult Living                  Review of Systems  Constitutional: Negative for fever and chills.  Musculoskeletal:       L wrist and elbow pain  Skin: Positive for color change. Negative for wound.  Neurological: Negative for dizziness, syncope, weakness and numbness.  Hematological: Does not bruise/bleed easily.    Allergies  Ezetimibe and Simvastatin  Home Medications   Current Outpatient Rx  Name Route Sig Dispense  Refill  . ALPRAZOLAM 0.25 MG PO TABS Oral Take 0.25 mg by mouth as needed.      . ATENOLOL 25 MG PO TABS Oral Take 25 mg by mouth as needed.      . ENALAPRIL MALEATE 10 MG PO TABS Oral Take 10 mg by mouth daily.      Marland Kitchen LEVOTHYROXINE SODIUM 75 MCG PO TABS Oral Take 75 mcg by mouth daily.      Marland Kitchen ZOFRAN PO Oral Take by mouth as needed.        BP 156/86  Pulse 82  Temp(Src) 99.1 F (37.3 C) (Oral)  Resp 18  SpO2 100%  Physical Exam  Constitutional: She appears well-developed and well-nourished. No distress.  HENT:  Head: Normocephalic and atraumatic.  Pulmonary/Chest: Effort normal.  Musculoskeletal:       Left elbow: She exhibits swelling. She exhibits normal range of motion and no deformity. tenderness found. Medial epicondyle tenderness noted.       Left wrist: She exhibits bony tenderness, swelling and deformity.       Deformity to L wrist from old injury; area mild tender to palp in bruised areas. Most pain is in distal radial area. Pain with ROM L elbow, mild tender to palp medial epicondyle area.   Skin: Skin is warm, dry and  intact. Bruising noted.       Extensive bruising to L dorsal wrist/hand, small bruising to L medial elbow.  Large bruise to L dorsal upper arm - pt states this is from having a door shut on her arm yesterday, unrelated to this injury/incident    ED Course  Procedures (including critical care time)  Labs Reviewed - No data to display Dg Forearm Left  03/02/2012  *RADIOLOGY REPORT*  Clinical Data: Left forearm pain  LEFT FOREARM - 2 VIEW  Comparison: None.  Findings: There is a remote fracture of the distal radius. No fracture of the ulnar styloid.  No evidence of acute fracture. Radiocarpal joint is intact.  No evidence of fracture of the radius or ulnar diaphysis.  IMPRESSION: No acute fracture.  Original Report Authenticated By: Genevive Bi, M.D.   Dg Wrist Complete Left  03/02/2012  *RADIOLOGY REPORT*  Clinical Data: Left wrist pain and bruising  secondary to a fall.Previous wrist fracture.  LEFT WRIST - COMPLETE 3+ VIEW  Comparison: None.  Findings: There is chronic deformity of the distal radius secondary to a prior fracture.  There is an old avulsion of the ulnar styloid with displacement.  However, there appears to be an acute fracture of the articular surface of the radius adjacent to the scaphoid.  This could be a defect from the prior injury but it appears to have sharper margins than I expect for a remote injury.  There are degenerative changes between the lunate and radius. Prominent soft tissue around the wrist.  IMPRESSION: Probable acute on old fracture of the distal radius involving the articular surface.  Original Report Authenticated By: Gwynn Burly, M.D.     1. Distal radial fracture       MDM          Cathlyn Parsons, NP 03/02/12 1620

## 2012-03-02 NOTE — Progress Notes (Signed)
Orthopedic Tech Progress Note Patient Details:  Jo Parrish Feb 04, 1926 [id number]  Type of Splint: [place]artong;Other (comment) Splint Location: left arm. Splint Interventions: Application    Alondria Mousseau T 03/02/2012, 4:18 PM

## 2012-03-03 NOTE — ED Provider Notes (Signed)
Medical screening examination/treatment/procedure(s) were performed by non-physician practitioner and as supervising physician I was immediately available for consultation/collaboration.   [facility]; MD   Sharin Grave, MD 03/03/12 [phone]

## 2012-09-26 ENCOUNTER — Encounter: Payer: Self-pay | Admitting: Gastroenterology

## 2012-09-29 ENCOUNTER — Ambulatory Visit: Payer: Medicare Other | Admitting: Gastroenterology

## 2012-10-22 ENCOUNTER — Ambulatory Visit: Payer: Medicare Other | Admitting: Gastroenterology

## 2012-11-06 ENCOUNTER — Inpatient Hospital Stay (HOSPITAL_COMMUNITY): Admission: RE | Admit: 2012-11-06 | Discharge: 2012-11-06 | Payer: Medicare Other | Source: Ambulatory Visit

## 2012-11-06 NOTE — Progress Notes (Signed)
Patient is no show for PAT appt today. Dr Sherene Sires office notified. She did not show for her appt with Dr Thurston Hole this am either. Pt will be cancelled per office.

## 2012-11-10 ENCOUNTER — Encounter (HOSPITAL_COMMUNITY): Admission: RE | Payer: Self-pay | Source: Ambulatory Visit

## 2012-11-10 ENCOUNTER — Ambulatory Visit (HOSPITAL_COMMUNITY): Admission: RE | Admit: 2012-11-10 | Payer: Medicare Other | Source: Ambulatory Visit | Admitting: Orthopedic Surgery

## 2012-11-10 SURGERY — ARTHROPLASTY, KNEE, TOTAL
Anesthesia: General | Laterality: Right

## 2012-12-26 ENCOUNTER — Other Ambulatory Visit (HOSPITAL_COMMUNITY): Payer: Self-pay | Admitting: Physician Assistant

## 2012-12-26 ENCOUNTER — Other Ambulatory Visit (HOSPITAL_COMMUNITY): Payer: Self-pay | Admitting: Cardiovascular Disease

## 2012-12-26 DIAGNOSIS — I10 Essential (primary) hypertension: Secondary | ICD-10-CM

## 2012-12-29 ENCOUNTER — Ambulatory Visit (HOSPITAL_COMMUNITY)
Admission: RE | Admit: 2012-12-29 | Discharge: 2012-12-29 | Disposition: A | Discharge status: Home / Self Care | Payer: Medicare Other | Source: Ambulatory Visit | Attending: Physician Assistant | Admitting: Physician Assistant

## 2012-12-29 DIAGNOSIS — R002 Palpitations: Secondary | ICD-10-CM | POA: Insufficient documentation

## 2012-12-29 DIAGNOSIS — I369 Nonrheumatic tricuspid valve disorder, unspecified: Secondary | ICD-10-CM | POA: Insufficient documentation

## 2012-12-29 DIAGNOSIS — I10 Essential (primary) hypertension: Secondary | ICD-10-CM

## 2012-12-29 DIAGNOSIS — I080 Rheumatic disorders of both mitral and aortic valves: Secondary | ICD-10-CM | POA: Insufficient documentation

## 2012-12-29 NOTE — Progress Notes (Signed)
2D Echo Performed 12/29/2012    Lenus Trauger, RCS  

## 2013-01-05 ENCOUNTER — Other Ambulatory Visit (HOSPITAL_COMMUNITY): Payer: Medicare Other

## 2013-01-12 ENCOUNTER — Inpatient Hospital Stay: Admit: 2013-01-12 | Payer: Self-pay | Admitting: Orthopedic Surgery

## 2013-01-12 ENCOUNTER — Other Ambulatory Visit (HOSPITAL_COMMUNITY): Payer: Self-pay | Admitting: Cardiovascular Disease

## 2013-01-12 DIAGNOSIS — R0989 Other specified symptoms and signs involving the circulatory and respiratory systems: Secondary | ICD-10-CM

## 2013-01-12 SURGERY — ARTHROPLASTY, KNEE, TOTAL
Anesthesia: General | Laterality: Right

## 2013-01-13 ENCOUNTER — Ambulatory Visit (HOSPITAL_COMMUNITY)
Admission: RE | Admit: 2013-01-13 | Discharge: 2013-01-13 | Disposition: A | Discharge status: Home / Self Care | Payer: Medicare Other | Source: Ambulatory Visit | Attending: Cardiovascular Disease | Admitting: Cardiovascular Disease

## 2013-01-13 DIAGNOSIS — R0989 Other specified symptoms and signs involving the circulatory and respiratory systems: Secondary | ICD-10-CM | POA: Insufficient documentation

## 2013-01-13 NOTE — Progress Notes (Signed)
Carotid artery duplex completed.   01/13/2013  Zaylin Runco, RDMS, RDCS  

## 2013-03-03 ENCOUNTER — Ambulatory Visit (HOSPITAL_COMMUNITY)
Admission: RE | Admit: 2013-03-03 | Discharge: 2013-03-03 | Disposition: A | Discharge status: Home / Self Care | Payer: Medicare Other | Source: Ambulatory Visit | Attending: Orthopedic Surgery | Admitting: Orthopedic Surgery

## 2013-03-03 DIAGNOSIS — IMO0001 Reserved for inherently not codable concepts without codable children: Secondary | ICD-10-CM | POA: Insufficient documentation

## 2013-03-03 DIAGNOSIS — M6281 Muscle weakness (generalized): Secondary | ICD-10-CM | POA: Insufficient documentation

## 2013-03-03 DIAGNOSIS — M25569 Pain in unspecified knee: Secondary | ICD-10-CM | POA: Insufficient documentation

## 2013-03-03 NOTE — Evaluation (Signed)
Physical Therapy Evaluation  Patient Details  Name: Jo Parrish MRN: [id number] Date of Birth: [birth year]  Today's Date: 03/03/2013 Time: 1435-1530 PT Time Calculation (min): 55 min Charges: 1 evaluation, 10' TE, 10' Manual              Visit#: 1 of 8  Re-eval: 04/02/13 Assessment Diagnosis: R knee pain Next MD Visit: Dr. Despina Hick  Prior Therapy: none  Authorization: BCBS Medicare    Authorization Time Period:    Authorization Visit#: 1 of 10   Past Medical History:  Past Medical History  Diagnosis Date  . Nausea     Persistent  . Hyperlipidemia   . Gastritis     Mild  . Hypertension   . Bacterial overgrowth syndrome     Small bowel  . Sjogren's syndrome   . Rheumatoid arthritis   . Chronic abdominal pain   . Hypothyroidism    Past Surgical History:  Past Surgical History  Procedure Laterality Date  . Appendectomy    . Esophagogastroduodenoscopy  06/07/2008     Gastritis. No celiac sprue/Small hiatal hernia  . Hydrogen breath test   05/17/2008    small bowel bacterial overgrowth/Hydrogen level rise consistent with small bowel bacterial    Subjective Symptoms/Limitations Symptoms: hx of a fall 1 year ago and broke her L arm, see hx section Pertinent History: Pt is referred to PT for R leg pain which she reports started about 1 year ago.  She initally had increased swelling and r/o a DVT and was diagnosed with a bakers cyst.  She has recieved injections, wore a brace, recieved a five series injection.C/o is weakness to her leg, difficulty crossing her legs.difficulty from sit to stand, step to pattern descending the stairs.  How long can you sit comfortably?: no difficulty How long can you stand comfortably?: less than 10 minutes  How long can you walk comfortably?: less than 10 minutes Patient Stated Goals: "I want to get my leg straight" Pain Assessment Currently in Pain?: Yes Pain Score:   4 (Pain range: 4-8/10 (worse at night)) Pain Location: Knee Pain Type:  Chronic pain Pain Onset: More than a month ago Pain Frequency: Constant ("nagging" pain) Pain Relieving Factors: taking tyleonly  Precautions/Restrictions  Precautions Precautions: None  Balance Screening Balance Screen Has the patient fallen in the past 6 months: No Has the patient had a decrease in activity level because of a fear of falling? : No Is the patient reluctant to leave their home because of a fear of falling? : No  Prior Functioning  Home Living Lives With: Alone Type of Home: House Home Layout: Two level Alternate Level Stairs-Number of Steps: 10 Alternate Level Stairs-Rails: Right  Sensation/Coordination/Flexibility/Functional Tests Flexibility 90/90: Positive  RLE AROM (degrees) Right Knee Extension: 2 Right Knee Flexion: 125  RLE Strength Right Hip Flexion: 4/5 Right Hip Extension: 4/5 Right Hip ABduction: 4/5 Right Knee Flexion: 4/5 Right Knee Extension: 4/5  Palpation: pain and tenderness to B hamstrings with knee flexion; significant popping   Mobility/Balance  Ambulation/Gait Ambulation/Gait: Yes Ambulation/Gait Assistance: 7: Independent Gait Pattern: Antalgic;Decreased hip/knee flexion - right;Right flexed knee in stance Static Standing Balance Single Leg Stance - Right Leg: 7 Single Leg Stance - Left Leg: 6 Tandem Stance - Right Leg: 10 Tandem Stance - Left Leg: 10 Rhomberg - Eyes Opened: 10 Rhomberg - Eyes Closed: 10   Exercise/Treatments Stretches Active Hamstring Stretch: 1 rep;30 seconds Gastroc Stretch: 1 rep;30 seconds Soleus Stretch: 1 rep;30 seconds Supine Quad Sets:  Right;5 reps Straight Leg Raises: Right;10 reps Sidelying Hip ABduction: Right;10 reps Prone  Hamstring Curl: 10 reps (snapping to hamstring) Hip Extension: Right;10 reps  Manual Therapy Manual Therapy: Myofascial release Myofascial Release: to rt popliteal region and insertion of hamstrings to improve hamstring length and decrease pain.   Physical  Therapy Assessment and Plan PT Assessment and Plan Clinical Impression Statement: Pt is an 79 year old female referred to PT for rt leg pain with following impairments listed below.  After evaluation it was found that she has significant impairments with her rt hamstring length causing snapping hamstring and popliteal muscle.  Pt had decreased snapping after manual therapy.  Pt will benefit from skilled therapeutic intervention in order to improve on the following deficits: Pain;Decreased strength;Impaired flexibility Rehab Potential: Good PT Frequency: Min 2X/week PT Duration: 4 weeks PT Treatment/Interventions: Stair training;Functional mobility training;Therapeutic activities;Therapeutic exercise;Balance training;Neuromuscular re-education;Patient/family education;Manual techniques;Modalities PT Plan: Continue with manual techniques and ultrasound to R hamstring region and continue with hamstring, quadricep, gastroc/solues, hip flexor stretchinng.  Squats, heel and toe raises, LAQ's    Goals Home Exercise Program Pt will Perform Home Exercise Program: Independently PT Goal: Perform Home Exercise Program - Progress: Goal set today PT Short Term Goals Time to Complete Short Term Goals: 2 weeks PT Short Term Goal 1: Pt will report pain less than 6/10 to her rt knee at night time.  PT Short Term Goal 2: Pt will report 50% decrease in hamstring popping when sitting and going from sit to stand for improved QOL.  PT Long Term Goals Time to Complete Long Term Goals: 4 weeks PT Long Term Goal 1: Pt will improve hamstring flexibility in order to to decrease knee popping to improve QOL.  PT Long Term Goal 2: Pt will report pain less than 3/10 to hamstring region at night time for improved QOL.  Long Term Goal 3: Pt will improve her RLE strength WNL in order to comfortably descend and ascend her basement staircase to safely get her laundry.   Problem List Patient Active Problem List  Diagnosis  .  Knee pain    PT Plan of Care PT Home Exercise Plan: see scanned report PT Patient Instructions: discussed knee mechanics, HEP, body mechanics, answered questions regarding HEP.  Consulted and Agree with Plan of Care: Patient  GP Functional Assessment Tool Used: clinical observation Functional Limitation: Mobility: Walking and moving around Mobility: Walking and Moving Around Current Status [phone]): At least 20 percent but less than 40 percent impaired, limited or restricted Mobility: Walking and Moving Around Goal Status [phone]): At least 1 percent but less than 20 percent impaired, limited or restricted  Adie Vilar, PT 03/03/2013, 4:24 PM  Physician Documentation Your signature is required to indicate approval of the treatment plan as stated above.  Please sign and either send electronically or make a copy of this report for your files and return this physician signed original.   Please mark one 1.__approve of plan  2. ___approve of plan with the following conditions.   ______________________________                                                          _____________________ Physician Signature  Date  

## 2013-03-05 ENCOUNTER — Ambulatory Visit (HOSPITAL_COMMUNITY)
Admission: RE | Admit: 2013-03-05 | Discharge: 2013-03-05 | Disposition: A | Discharge status: Home / Self Care | Payer: Medicare Other | Source: Ambulatory Visit | Attending: Orthopedic Surgery | Admitting: Orthopedic Surgery

## 2013-03-05 NOTE — Progress Notes (Signed)
Physical Therapy Treatment Patient Details  Name: Jo Parrish MRN: [id number] Date of Birth: 1926/11/08  Today's Date: 03/05/2013 Time: 0930-1015 PT Time Calculation (min): 45 min Charge: therex 27', [place] 8', Manual 10'  Visit#: 2 of 8  Re-eval: 04/02/13    Authorization: BCBS Medicare  Authorization Time Period:    Authorization Visit#: 2 of 10   Subjective: Symptoms/Limitations Symptoms: Pt reported compliance with HEP, R knee pain scale 7/10 Pain Assessment Currently in Pain?: Yes Pain Score:   7 Pain Location: Knee Pain Orientation: Right  Objective :  Exercise/Treatments Stretches Active Hamstring Stretch: 3 reps;30 seconds Quad Stretch: 2 reps;30 seconds Hip Flexor Stretch: 2 reps;30 seconds Gastroc Stretch: 1 rep;30 seconds Soleus Stretch: 1 rep;30 seconds Seated Long Arc Quad: 10 reps;Weights Long Arc Quad Weight: 2 lbs. Supine Quad Sets: Right;10 reps Terminal Knee Extension: Right;5 reps Straight Leg Raises: Right;10 reps   Modalities Modalities: Ultrasound Manual Therapy Manual Therapy: Myofascial release Ultrasound Ultrasound Location: Distal hamstring insertion point for musculature lengthening and pain relief Ultrasound Parameters: 1.2 w/cm2 continuous Ultrasound Goals: Pain;Other (Comment) (hamstring lengthening)  Physical Therapy Assessment and Plan PT Assessment and Plan Clinical Impression Statement: Began POC for strengthening, stretches and manual techniques/modalities to improve musculature lengthening for pain relief.  Pt instructed and given HEP worksheet for hamstrings, quadricep, gastroc/soleau and hip flexor stretches for maximum benefits.  Pt able to demonstrate appropriate technique with all therex wth min cueing for form with functional squats. PT Plan: Continue with current POC.  Continue with manual techniques and ultrasound to R hamstring region and continue with hamstring, quadricep, gastroc/solues, hip flexor stretchinng.       Goals    Problem List Patient Active Problem List  Diagnosis  . Knee pain    PT - End of Session Activity Tolerance: Patient tolerated treatment well General Behavior During Session: [facility] for tasks performed Cognition: [facility] for tasks performed  GP    Juel Burrow 03/05/2013, 10:45 AM

## 2013-03-10 ENCOUNTER — Ambulatory Visit (HOSPITAL_COMMUNITY)
Admission: RE | Admit: 2013-03-10 | Discharge: 2013-03-10 | Disposition: A | Discharge status: Home / Self Care | Payer: Medicare Other | Source: Ambulatory Visit | Attending: Orthopedic Surgery | Admitting: Orthopedic Surgery

## 2013-03-10 NOTE — Progress Notes (Signed)
Physical Therapy Treatment Patient Details  Name: Jo Parrish MRN: [id number] Date of Birth: 1926/11/16  Today's Date: 03/10/2013 Time: 1304-1350 PT Time Calculation (min): 46 min Charge: [place] 8', therex 15', manual 23'  Visit#: 3 of 8  Re-eval: 04/02/13    Authorization: BCBS Medicare  Authorization Time Period:    Authorization Visit#: 3 of 10   Subjective: Symptoms/Limitations Symptoms: I feel wose than I was originally, I am not sleeping at all because of the pain.   Pain Assessment Currently in Pain?: Yes Pain Score:   3 Pain Location: Knee  Objective:   Exercise/Treatments Stretches Passive Hamstring Stretch: 30 seconds;4 reps;2 reps;60 seconds Gastroc Stretch: 3 reps;30 seconds Soleus Stretch: 2 reps;30 seconds Supine: Patella mobs all directions and tib/fib A/P  Modalities Modalities: Ultrasound Manual Therapy Manual Therapy: Myofascial release Myofascial Release: Prone:  rt popliteal region and insertion of hamstrings to improve hamstring length and decrease pain Ultrasound Ultrasound Location: Distal hamstring insertion point for musculature lengthening and pain relief Ultrasound Parameters: 1.2 w/cm2 continuous  Ultrasound Goals: Pain (musculature lengthening)  Physical Therapy Assessment and Plan PT Assessment and Plan Clinical Impression Statement: Session focus on [place] to hamstring and popliteal region and manual techniques for musculature lengthening and pain relief.  Pt stated no pain at end of session but was sore.  PT Plan: Continue with current POC.  Continue with manual techniques and ultrasound to R hamstring region and continue with hamstring, quadricep, gastroc/solues, hip flexor stretchinng.  Next session begin open chain exercises for LE strengthening.    Goals    Problem List Patient Active Problem List  Diagnosis  . Knee pain    PT - End of Session Activity Tolerance: Patient tolerated treatment well General Behavior During  Session: [facility] for tasks performed Cognition: [facility] for tasks performed  GP    Juel Burrow 03/10/2013, 2:59 PM

## 2013-03-12 ENCOUNTER — Ambulatory Visit (HOSPITAL_COMMUNITY)
Admission: RE | Admit: 2013-03-12 | Discharge: 2013-03-12 | Disposition: A | Discharge status: Home / Self Care | Payer: Medicare Other | Source: Ambulatory Visit | Attending: Orthopedic Surgery | Admitting: Orthopedic Surgery

## 2013-03-12 NOTE — Progress Notes (Signed)
Physical Therapy Treatment Patient Details  Name: Jo Parrish MRN: [id number] Date of Birth: [birth year]  Today's Date: 03/12/2013 Time: 7829-5621 PT Time Calculation (min): 43 min Charges: Manual x25', [place] x8', TE x10 Visit#: 3 of 8  Re-eval: 04/02/13    Authorization: BCBS Medicare  Authorization Time Period:    Authorization Visit#: 3 of 10   Subjective: Symptoms/Limitations Symptoms: Pt reports that she is just feeling bad all over today.  States that she feels like her leg is a little better. States she just feels like her leg is weak and wont work.  She reports that she would like to go to the [facility].  Pain Assessment Currently in Pain?: Yes Pain Score:   3 Pain Location: Knee Pain Orientation: Right Pain Type: Chronic pain  Precautions/Restrictions     Exercise/Treatments Stretches Gastroc Stretch: 3 reps;30 seconds Standing Heel Raises: 20 reps;Limitations Heel Raises Limitations: Toe Raises: 20 reps Knee Flexion: Right;10 reps Functional Squat: Limitations Functional Squat Limitations: Pain Supine Quad Sets: Right;5 reps Bridges: 10 reps Straight Leg Raises: Right;15 reps Patellar Mobs: all directions and tib/fib Sidelying Hip ABduction: Right;10 reps Prone  Hamstring Curl: 10 reps Hip Extension: Right;10 reps   Modalities Modalities: Ultrasound Manual Therapy Manual Therapy: Myofascial release Myofascial Release: Prone to rt popliteal region and insertion of hamstrings to improve hamstring length and decreae pain  Ultrasound Ultrasound Location: prone: distal hamstring insertion point for muscular lengthening and pain relief.  Ultrasound Parameters: 1.2 w.cm2 3 mhz (medium head) continious  Physical Therapy Assessment and Plan PT Assessment and Plan Clinical Impression Statement: Pt able to complete more exercises today due to improved motivation and decreased pain to her knee.  She continues to rate low pain.  Able to increase reps of exercises to  improve strength.  PT Plan: Continue to improve RLE strength while keeping pain low.  Continue with manual and modalites as needed.  Progress balance activities.     Goals Home Exercise Program Pt will Perform Home Exercise Program: Independently PT Goal: Perform Home Exercise Program - Progress: Met PT Short Term Goals Time to Complete Short Term Goals: 2 weeks PT Short Term Goal 1: Pt will report pain less than 6/10 to her rt knee at night time.  PT Short Term Goal 1 - Progress: Progressing toward goal PT Short Term Goal 2: Pt will report 50% decrease in hamstring popping when sitting and going from sit to stand for improved QOL.  PT Short Term Goal 2 - Progress: Progressing toward goal PT Long Term Goals Time to Complete Long Term Goals: 4 weeks PT Long Term Goal 1: Pt will improve hamstring flexibility in order to to decrease knee popping to improve QOL.  PT Long Term Goal 1 - Progress: Progressing toward goal PT Long Term Goal 2: Pt will report pain less than 3/10 to hamstring region at night time for improved QOL.  PT Long Term Goal 2 - Progress: Progressing toward goal Long Term Goal 3: Pt will improve her RLE strength WNL in order to comfortably descend and ascend her basement staircase to safely get her laundry.  Long Term Goal 3 Progress: Progressing toward goal  Problem List Patient Active Problem List  Diagnosis  . Knee pain    PT - End of Session Activity Tolerance: Patient tolerated treatment well General Behavior During Session: [facility] for tasks performed Cognition: [facility] for tasks performed  GP    Markice Torbert, PT 03/12/2013, 10:29 AM

## 2013-03-16 ENCOUNTER — Ambulatory Visit (HOSPITAL_COMMUNITY)
Admission: RE | Admit: 2013-03-16 | Discharge: 2013-03-16 | Disposition: A | Discharge status: Home / Self Care | Payer: Medicare Other | Source: Ambulatory Visit | Attending: Orthopedic Surgery | Admitting: Orthopedic Surgery

## 2013-03-16 DIAGNOSIS — M6281 Muscle weakness (generalized): Secondary | ICD-10-CM | POA: Insufficient documentation

## 2013-03-16 DIAGNOSIS — M25569 Pain in unspecified knee: Secondary | ICD-10-CM | POA: Insufficient documentation

## 2013-03-16 DIAGNOSIS — IMO0001 Reserved for inherently not codable concepts without codable children: Secondary | ICD-10-CM | POA: Insufficient documentation

## 2013-03-16 NOTE — Progress Notes (Signed)
Physical Therapy Treatment Patient Details  Name: Jo Parrish MRN: [id number] Date of Birth: 1926/03/02  Today's Date: 03/16/2013 Time: 4098-1191 PT Time Calculation (min): 38 min Charges: 15' Manual, 15' TE, 8' [place] Visit#: 4 of 8  Re-eval: 04/02/13    Authorization: BCBS Medicare  Authorization Time Period:    Authorization Visit#: 4 of 10   Subjective: Symptoms/Limitations Symptoms: Pt reports that she is having only mild soreness in the morning.  She was very sore after Thursday visit with all new activities.  Pain Assessment Currently in Pain?: Yes Pain Score:   1 Pain Location: Knee Pain Orientation: Right  Precautions/Restrictions     Exercise/Treatments Stretches Gastroc Stretch: 3 reps;30 seconds Standing Rocker Board: 2 minutes Supine Straight Leg Raises: Right;15 reps;Limitations Straight Leg Raises Limitations: 3# Sidelying Hip ABduction: Right;15 reps;Limitations Hip ABduction Limitations: 3# Prone  Hamstring Curl: 15 reps;Limitations Hamstring Curl Limitations: 3# Hip Extension: 15 reps;Limitations Hip Extension Limitations: 3#  Manual Therapy Manual Therapy: Myofascial release Myofascial Release: Prone to rt popliteal region and insertion of hamstring to improve hamstring length and decrease pain. Ultrasound Ultrasound Location: Supine: rt knee joint line Ultrasound Parameters: 0.8 w/cm2 continuous 3 Mhz (medium head) Ultrasound Goals: Pain  Physical Therapy Assessment and Plan PT Assessment and Plan Clinical Impression Statement: Pt continues to require mod cueing for apporpriate gait mechancis and improves by end of session with adjustment to [facility].  Added [place] to anterior knee to decrease pain.  Has significant reduction in fascial restrictions to hamstring insertion with less popping to knee.  Continues to show slow and steady progress and plans to attend [employer] tonight to continue with HEP. Demonstrates independence with HEP.  PT Plan: Continue to  improve RLE strength while keeping pain low.  Continue with manual and modalites as needed.  Progress balance activities.     Goals    Problem List Patient Active Problem List  Diagnosis  . Knee pain    PT - End of Session Activity Tolerance: Patient tolerated treatment well General Behavior During Session: [facility] for tasks performed Cognition: [facility] for tasks performed  GP    Jo Parrish 03/16/2013, 5:29 PM

## 2013-03-18 ENCOUNTER — Ambulatory Visit (HOSPITAL_COMMUNITY)
Admission: RE | Admit: 2013-03-18 | Discharge: 2013-03-18 | Disposition: A | Discharge status: Home / Self Care | Payer: Medicare Other | Source: Ambulatory Visit | Attending: Orthopedic Surgery | Admitting: Orthopedic Surgery

## 2013-03-18 ENCOUNTER — Ambulatory Visit (HOSPITAL_COMMUNITY): Payer: Medicare Other | Admitting: Physical Therapy

## 2013-03-18 DIAGNOSIS — M25569 Pain in unspecified knee: Secondary | ICD-10-CM | POA: Insufficient documentation

## 2013-03-18 DIAGNOSIS — IMO0001 Reserved for inherently not codable concepts without codable children: Secondary | ICD-10-CM | POA: Insufficient documentation

## 2013-03-18 DIAGNOSIS — M6281 Muscle weakness (generalized): Secondary | ICD-10-CM | POA: Insufficient documentation

## 2013-03-18 NOTE — Progress Notes (Signed)
Physical Therapy Treatment Patient Details  Name: Jo Parrish MRN: [id number] Date of Birth: 01-10-1926  Today's Date: 03/18/2013 Time: 1650-1735 PT Time Calculation (min): 45 min Charge: [place] 8', Manual 15', therex 20  Visit#: 5 of 8  Re-eval: 04/02/13    Authorization: [employer]BS Medicare  Authorization Time Period:    Authorization Visit#: 5 of 10   Subjective: Symptoms/Limitations Symptoms: Pt entered dept ambulating without AD with limp.  Pt reported she forgot SPC. Pt stated pain anterior distal R knee 3/10 Pain Assessment Currently in Pain?: Yes Pain Score:   3 Pain Location: Knee Pain Orientation: Right;Anterior;Distal  Objective:   Exercise/Treatments Stretches Gastroc Stretch: 3 reps;30 seconds Standing Rocker Board: 2 minutes Supine Straight Leg Raises: Right;15 reps;Limitations Straight Leg Raises Limitations: 3# Sidelying Hip ABduction: Right;15 reps;Limitations Hip ABduction Limitations: 3# Prone  Hamstring Curl: 15 reps;Limitations Hamstring Curl Limitations: 3# Hip Extension: 15 reps;Limitations Hip Extension Limitations: 3#   Modalities Modalities: Ultrasound Manual Therapy Manual Therapy: Myofascial release Joint Mobilization: patella mobs all directions and tib/fib Myofascial Release: prone to rt popliteal region and insertion of hamstrings to improve hamstring length and decrease pain Ultrasound Ultrasound Location: Supine Rt knee joint line  Ultrasound Parameters: 0.8 w/cm2 continuous (medium head Ultrasound Goals: Pain  Physical Therapy Assessment and Plan PT Assessment and Plan Clinical Impression Statement: Pt entered dept with no AD, pt educated on importance of using SPC to imporve gait mechanics.  Continued with [place] to anterior joint line with joint mobs and MFR to hamstring insertions to reduce fascial restrictions and reduce pain.  Pt reported pain resolved at end of session.   PT Plan: Continue to improve RLE strength while keeping  pain low.  Continue with manual and modalites as needed.  Progress balance activities.     Goals    Problem List Patient Active Problem List  Diagnosis  . Knee pain    PT - End of Session Activity Tolerance: Patient tolerated treatment well General Behavior During Session: [facility] for tasks performed Cognition: [facility] for tasks performed  GP    Juel Burrow 03/18/2013, 5:44 PM

## 2013-03-23 ENCOUNTER — Ambulatory Visit (HOSPITAL_COMMUNITY)
Admission: RE | Admit: 2013-03-23 | Discharge: 2013-03-23 | Disposition: A | Discharge status: Home / Self Care | Payer: Medicare Other | Source: Ambulatory Visit | Attending: Physical Therapy | Admitting: Physical Therapy

## 2013-03-23 NOTE — Progress Notes (Signed)
Physical Therapy Treatment Patient Details  Name: Jo Parrish MRN: [id number] Date of Birth: January 25, 1926  Today's Date: 03/23/2013 Time: 7846-9629 PT Time Calculation (min): 45 min Charges: 8' TE, 16' [place], 21' Manual Visit#: 6 of 8  Re-eval: 04/02/13   Authorization: [employer] Medicare  Authorization Time Period:    Authorization Visit#: 6 of 10   Subjective: Symptoms/Limitations Symptoms: Pt reports greater ease placing her rt shoe on and being able to cross her rt leg over her lt.  She has been to the gym 1x and went dancing this past Friday and Saturday and was sore afterwards.  Pain Assessment Currently in Pain?: Yes Pain Score:   4 Pain Location: Knee Pain Orientation: Right  Precautions/Restrictions     Exercise/Treatments  Modalities Modalities: Ultrasound Manual Therapy Manual Therapy: Myofascial release Myofascial Release: supine to anterior knee, prone to rt popliteal region and insertion of hamstrings to improve hamstring length and decrease pain Ultrasound Ultrasound Location: Supine Rt knee joint line; Prone to hamstring insertion points Ultrasound Parameters: 0.8 w/cm2 continuous (medium head) 8 minutes each. x2 locations Ultrasound Goals: Pain  Physical Therapy Assessment and Plan PT Assessment and Plan Clinical Impression Statement: Pt continues to make significant gains with decreaed pain to her knee region and is now able to easily place her right foot on her lt knee without increased pain.  does not use her SPC in the gym or for community ambulation. completed 2 rounds of [place] on front and back of knee for improved mobility and decreased pain.  PT Plan: Continue to decrease overall pain.  Discussed with pt decreasing to 2 visits a month and then 1x/month for 2 months.     Goals    Problem List Patient Active Problem List  Diagnosis  . Knee pain    PT - End of Session Activity Tolerance: Patient tolerated treatment well General Behavior During  Session: [facility] for tasks performed Cognition: [facility] for tasks performed  Damya Comley, PT 03/23/2013, 5:44 PM

## 2013-03-26 ENCOUNTER — Ambulatory Visit (HOSPITAL_COMMUNITY)
Admission: RE | Admit: 2013-03-26 | Discharge: 2013-03-26 | Disposition: A | Discharge status: Home / Self Care | Payer: Medicare Other | Source: Ambulatory Visit

## 2013-03-26 NOTE — Progress Notes (Signed)
Physical Therapy Treatment Patient Details  Name: Jo Parrish MRN: [id number] Date of Birth: 1926/01/03  Today's Date: 03/26/2013 Time: 1608-1700 PT Time Calculation (min): 52 min Charge: [place] 16', Manual 16', Therex 20'  Visit#: 7 of 8  Re-eval: 04/02/13    Authorization: [employer]BS Medicare  Authorization Time Period:    Authorization Visit#: 7 of 10   Subjective: Symptoms/Limitations Symptoms: Pt reports Rt knee is feeling better,  Pain Assessment Currently in Pain?: Yes Pain Score:   3 Pain Location: Knee Pain Orientation: Right  Objective:   Exercise/Treatments Stretches Gastroc Stretch: 3 reps;30 seconds Aerobic Elliptical: NuStep x10 minutes Level 3 [place] #2 for strengthening.  Standing Functional Squat: 10 reps;5 seconds    Physical Therapy Assessment and Plan PT Assessment and Plan Clinical Impression Statement: Pt with improved mechanics with therex functional squats, able to perform wtihout complaints of pain.  Improved activity tolerance, able to keep SPM >100 for 10 minutes on NuStep.  Pt continues to make significants gain in pain reduced following [place] and manual MFR to reduce fascial restrictions, adhesions and spasms to popliteal region. PT Plan: Reassess next session.  Continue to decrease overall pain.  Discussed with pt decreasing to 2 visits a month and then 1x/month for 2 months.     Goals    Problem List Patient Active Problem List  Diagnosis  . Knee pain    PT - End of Session Activity Tolerance: Patient tolerated treatment well General Behavior During Session: [facility] for tasks performed Cognition: [facility] for tasks performed  GP    Juel Burrow 03/26/2013, 7:08 PM

## 2013-04-07 ENCOUNTER — Ambulatory Visit (HOSPITAL_COMMUNITY)
Admission: RE | Admit: 2013-04-07 | Discharge: 2013-04-07 | Disposition: A | Discharge status: Home / Self Care | Payer: Medicare Other | Source: Ambulatory Visit | Attending: Orthopedic Surgery | Admitting: Orthopedic Surgery

## 2013-04-07 NOTE — Progress Notes (Addendum)
Physical Therapy Re-evaluation/treatment  Patient Details  Name: Jo Parrish MRN: [id number] Date of Birth: 12-03-1926  Today's Date: 04/07/2013 Time: 1015-1104 PT Time Calculation (min): 49 min Charge: MMT x 1, [place] x 16', Manual 8, therex x 10', self care x 10'              Visit#: 8 of 10  Re-eval: 05/05/13 Assessment Diagnosis: R knee pain Next MD Visit: Dr. Despina Hick   Authorization: BCBS Medicare    Authorization Time Period:    Authorization Visit#: 8 of 10   Subjective Symptoms/Limitations Symptoms: Pt reported pain free today.  Pt c/o cramps following riding NuStep.  Pt stated 50% improvement overall.  Pt continues to c/o knee coming out of place. How long can you sit comfortably?: no difficulty How long can you stand comfortably?: less than 10 minutes  How long can you walk comfortably?: less than 10 minutes Pain Assessment Currently in Pain?: No/denies  Precautions/Restrictions  Precautions Precautions: None  Objective:  RLE Strength Right Hip Flexion:  (4+/5 was 4/5) Right Hip Extension: 5/5 (was 4/5) Right Hip ABduction: 5/5 (was 4/5) Right Knee Flexion:  (4+/5 was 4/5) Right Knee Extension: 5/5 (was 4/5)  Exercise/Treatments Aerobic Elliptical: NuStep x6 minutes Level 3 [place] #2 for strengthening.  Supine Straight Leg Raises: 15 reps;Limitations Straight Leg Raises Limitations: pain free  Modalities Modalities: Ultrasound Manual Therapy Manual Therapy: Myofascial release Myofascial Release: supine to anterior lateral knee, prone to Rt popliteal region and insertion of hamstrings to improve hamstring length and decrease pain. Ultrasound Ultrasound Location: Supine to Rt knee joint line; Prone to hamstring insertion points Ultrasound Parameters: 0.8 w/cm2 continuous (medium head) 8 minutes each= 16' total Ultrasound Goals: Pain  Physical Therapy Assessment and Plan PT Assessment and Plan Clinical Impression Statement: Re-eval complete: Jo Parrish  has had 8 OPPT sessions over 4 weeks with the following findings:  Pt has met 1/2 STGs and 2/3 LTgs.  Pt independent with HEP and able to demonstrate appropriate technique with all exercises, encouraged to increase frequency for maximum benfits.  Pt stated currently pain free today and reported she did not have any bone popping yesterrday.  Pt does continue to c/o hamstring popping with sit to stands.  Improved hamstring flexilitiby and overall LE strengt with ability to ascend and descend 12 steps in her basement to safety get laundry.    Frequency: 2 visits  Duration: 1 month PT Plan: Recommend continuing OPPT 2 visits a month and then 1x/month for 2 months per discussion with pt and evaluating PT.    Goals Home Exercise Program Pt will Perform Home Exercise Program: Independently: Met PT Short Term Goals: 2 weeks PT Short Term Goal 1: Pt will report pain less than 6/10 to her rt knee at night time. : Met PT Short Term Goal 2: Pt will report 50% decrease in hamstring popping when sitting and going from sit to stand for improved QOL.: Progressing toward goal PT Long Term Goals: 4 weeks PT Long Term Goal 1: Pt will improve hamstring flexibility in order to to decrease knee popping to improve QOL. : Progressing toward goal PT Long Term Goal 2: Pt will report pain less than 3/10 to hamstring region at night time for improved QOL. : Met Long Term Goal 3: Pt will improve her RLE strength WNL in order to comfortably descend and ascend her basement staircase to safely get her laundry. : Met (12 steps; step to pattern with laundry)  Problem List Patient Active  Problem List  Diagnosis  . Knee pain    PT - End of Session Activity Tolerance: Patient tolerated treatment well General Behavior During Therapy: [facility] for tasks assessed/performed Cognition: [facility] for tasks performed  GP Functional Assessment Tool Used: clinical observation Functional Limitation: Mobility: Walking and moving  around Mobility: Walking and Moving Around Current Status ([id number]): At least 20 percent but less than 40 percent impaired, limited or restricted Mobility: Walking and Moving Around Goal Status [phone]): At least 1 percent but less than 20 percent impaired, limited or restricted  Becky Sax Jo/ Annett Fabian, MPT, ATC 04/07/2013, 11:10 AM

## 2013-04-13 ENCOUNTER — Telehealth (HOSPITAL_COMMUNITY): Payer: Self-pay

## 2013-04-28 ENCOUNTER — Ambulatory Visit (HOSPITAL_COMMUNITY): Payer: Medicare Other

## 2013-05-17 HISTORY — PX: ESOPHAGOGASTRODUODENOSCOPY: SHX1529

## 2013-05-17 HISTORY — PX: COLONOSCOPY: SHX174

## 2013-07-14 ENCOUNTER — Telehealth: Payer: Self-pay | Admitting: Endocrinology

## 2013-07-14 NOTE — Telephone Encounter (Signed)
Left message on voicemail with instructions, told her to call me back if she needs a new rx.

## 2013-07-14 NOTE — Telephone Encounter (Signed)
TSH from 07/02/13 done by PCP is 0.1 She is currently taking Armour Thyroid 60 mg, 1 tablet alternating with one and a half She needs to reduce the dose to 1 tablet daily and see me in followup in 6 weeks with labs

## 2013-07-22 ENCOUNTER — Telehealth: Payer: Self-pay | Admitting: *Deleted

## 2013-07-22 NOTE — Telephone Encounter (Signed)
**  Walk-In**  Requesting samples of Zetia.    No samples available.  However, per chart Zetia dc'd at last OV.  Left message with that information and to call back tomorrow before 4pm.

## 2013-07-23 ENCOUNTER — Telehealth: Payer: Self-pay | Admitting: Cardiovascular Disease

## 2013-07-23 NOTE — Telephone Encounter (Signed)
Returned call.  Pt stated she thought she was having problems w/ her legs r/t Zetia and Crestor, but it turned out she had problems w/ her knees.  Stated her PCP, Dr. Sherwood Gambler, started her back on Zetia and it is very expensive.  Pt stated she has enough for a few weeks, but wanted to get samples.  Reviewed chart and pt was to f/u in Sept 2014, but would like to schedule f/u appt on 8.21.14 b/c she has another appt and wants to keep from making multiple visits to [place].  Informed Dr. Allyson Sabal is not available that day.  Pt asked if Anner Crete was available that day and informed he does have openings that day.  Pt scheduled appt on 8.21.14 at 1:40pm w/ Judie Grieve, PA-C.  Pt would like samples of Zetia when she comes in.  Pt informed that can be arranged if they are available and it is approved.  Pt verbalized understanding and agreed w/ plan.

## 2013-07-23 NOTE — Telephone Encounter (Signed)
Jo Parrish bis Returning your call about getting samples of Zetia. Please call   Thanks

## 2013-08-06 ENCOUNTER — Ambulatory Visit: Payer: Medicare Other | Admitting: Physician Assistant

## 2013-08-06 ENCOUNTER — Encounter: Payer: Self-pay | Admitting: Physician Assistant

## 2013-08-06 ENCOUNTER — Ambulatory Visit (INDEPENDENT_AMBULATORY_CARE_PROVIDER_SITE_OTHER): Payer: Medicare Other | Admitting: Physician Assistant

## 2013-08-06 VITALS — BP 124/62 | HR 61 | Ht 62.0 in | Wt 152.2 lb

## 2013-08-06 DIAGNOSIS — I5032 Chronic diastolic (congestive) heart failure: Secondary | ICD-10-CM

## 2013-08-06 DIAGNOSIS — I10 Essential (primary) hypertension: Secondary | ICD-10-CM | POA: Insufficient documentation

## 2013-08-06 DIAGNOSIS — E785 Hyperlipidemia, unspecified: Secondary | ICD-10-CM | POA: Insufficient documentation

## 2013-08-06 MED ORDER — FENOFIBRATE 48 MG PO TABS: 48.0000 mg | ORAL_TABLET | Freq: Every day | ORAL | Status: DC

## 2013-08-06 NOTE — Patient Instructions (Addendum)
Start taking fenofibrate 48 mg daily.  followup with Dr. Allyson Sabal in 6 months.  Have a nice weekend!

## 2013-08-06 NOTE — Assessment & Plan Note (Signed)
No recurrence of lower extremity edema since her previous episode back in January of this year.

## 2013-08-06 NOTE — Progress Notes (Signed)
Date:  08/06/2013   ID:  TORIANNA Parrish, DOB [birth year], MRN [id number]  PCP:  Cassell Smiles., MD  Primary Cardiologist:  Allyson Sabal    History of Present Illness: Jo Parrish is a 79 y.o. female mildly overweight, very active, widowed, Caucasian female, mother of 1 child who last saw Dr. Allyson Sabal on 19 2014. She has a history of hypertension, hyperlipidemia, hypothyroidism and lupus. She had a negative Myoview April 2012 and normal 2D echo except for diastolic dysfunction. Her most recent 2-D echocardiogram was January 2014 showed an ejection fraction of 45%-50% grade 1 diastolic dysfunction.  She apparently needs a total knee replacement by Dr. Thurston Hole and has seen Dr. Lequita Halt for a second opinion. She was seen in our office on December 26, 2012, because of lower extremity edema and was placed on diuretics for several days which resulted in complete resolution of this. It also appears that she is statin intolerant.    Patient presents today for six-month follow-up visit.  She appears to be doing quite well and was just seen by her orthopedic doctor this morning.  Patient scheduled for right knee replacement 10/09/2013.  She did recently have some labs drawn her total cholesterol was 255 triglycerides 197 HDL 42 VLDL 39 and LDL of 295. TSH is low at 0.100 as she's been started on Armour 60 mg daily.  CBC and CMP were unremarkable.  Other than some persistent nausea the patient currently denies vomiting, fever, chest pain, shortness of breath, orthopnea, dizziness, PND, cough, congestion, abdominal pain, hematochezia, melena, lower extremity edema, claudication.  Wt Readings from Last 3 Encounters:  08/06/13 152 lb 3.2 oz (69.037 kg)     Past Medical History  Diagnosis Date  . Nausea     Persistent  . Hyperlipidemia   . Gastritis     Mild  . Hypertension   . Bacterial overgrowth syndrome     Small bowel  . Sjogren's syndrome   . Rheumatoid arthritis(714.0)   . Chronic abdominal pain   .  Hypothyroidism   . OSA (obstructive sleep apnea)     Current Outpatient Prescriptions  Medication Sig Dispense Refill  . amLODipine (NORVASC) 5 MG tablet Take 5 mg by mouth daily.      Marland Kitchen atenolol (TENORMIN) 25 MG tablet Take 25 mg by mouth daily.       . cholecalciferol (VITAMIN D) 1000 UNITS tablet Take 1,000 Units by mouth daily.      . cyanocobalamin 1000 MCG tablet Take 100 mcg by mouth daily.      Marland Kitchen ezetimibe (ZETIA) 10 MG tablet Take 10 mg by mouth daily.      . fish oil-omega-3 fatty acids 1000 MG capsule Take 2 g by mouth 2 (two) times daily.      . irbesartan (AVAPRO) 300 MG tablet Take 300 mg by mouth daily.      Marland Kitchen omeprazole (PRILOSEC) 40 MG capsule Take 40 mg by mouth daily.      Marland Kitchen thyroid (ARMOUR) 60 MG tablet Take 60 mg by mouth daily before breakfast.      . fenofibrate (TRICOR) 48 MG tablet Take 1 tablet (48 mg total) by mouth daily.  90 tablet  3   No current facility-administered medications for this visit.    Allergies:    Allergies  Allergen Reactions  . Simvastatin     Social History:  The patient  reports that she has never smoked. She does not have any smokeless tobacco history on  file. She reports that she does not drink alcohol or use illicit drugs.   Family history:  No family history on file.  ROS:  Please see the history of present illness.  All other systems reviewed and negative.   PHYSICAL EXAM: VS:  BP 124/62  Pulse 61  Ht 5\' 2"  (1.575 m)  Wt 152 lb 3.2 oz (69.037 kg)  BMI 27.83 kg/m2 Well nourished, well developed, in no acute distress HEENT: Pupils are equal round react to light accommodation extraocular movements are intact.  Neck: no JVDNo cervical lymphadenopathy. Cardiac: Regular rate and rhythm without murmurs rubs or gallops. Lungs:  clear to auscultation bilaterally, no wheezing, rhonchi or rales Abd: soft, nontender, positive bowel sounds all quadrants, no hepatosplenomegaly Ext: no lower extremity edema.  2+ radial and dorsalis  pedis pulses. Skin: warm and dry Neuro:  Grossly normal  EKG:  Normal sinus rhythm with a left bundle branch block rate 61 beats per minute.  Unchanged from prior  ASSESSMENT AND PLAN:  Problem List Items Addressed This Visit   Hyperlipidemia     The patient's lipids are poorly controlled. She does take his EF and his statin intolerant. Triglycerides in particular 197. I'm going to start fenofibrate. She is articulate facial. We have discussed cutting back on the amount of carbohydrates that she is eating. She did describe her meals from yesterday and she was ingesting quite a bit of carbohydrate form of fruits, bread and grains.    Relevant Medications      irbesartan (AVAPRO) 300 MG tablet      amLODipine (NORVASC) 5 MG tablet      ezetimibe (ZETIA) 10 MG tablet      fenofibrate (TRICOR) tablet   Essential hypertension     Blood pressure is well controlled.    Relevant Medications      irbesartan (AVAPRO) 300 MG tablet      amLODipine (NORVASC) 5 MG tablet      ezetimibe (ZETIA) 10 MG tablet      fenofibrate (TRICOR) tablet   Chronic diastolic heart failure     No recurrence of lower extremity edema since her previous episode back in January of this year.    Relevant Medications      irbesartan (AVAPRO) 300 MG tablet      amLODipine (NORVASC) 5 MG tablet      ezetimibe (ZETIA) 10 MG tablet      fenofibrate (TRICOR) tablet    Other Visit Diagnoses   Hypertension    -  Primary    Relevant Medications       irbesartan (AVAPRO) 300 MG tablet       amLODipine (NORVASC) 5 MG tablet       ezetimibe (ZETIA) 10 MG tablet       fenofibrate (TRICOR) tablet    Other Relevant Orders       EKG 12-Lead            Patient should probably be consider low to moderate risk for the surgery. She did have a negative Myoview stress test April 2012 and her last 2-D echocardiogram was January 2014 showed an ejection fraction 45-50%.  She said absolutely no symptoms of angina.

## 2013-08-06 NOTE — Assessment & Plan Note (Signed)
The patient's lipids are poorly controlled. She does take his EF and his statin intolerant. Triglycerides in particular 197. I'm going to start fenofibrate. She is articulate facial. We have discussed cutting back on the amount of carbohydrates that she is eating. She did describe her meals from yesterday and she was ingesting quite a bit of carbohydrate form of fruits, bread and grains.

## 2013-08-06 NOTE — Assessment & Plan Note (Signed)
Blood pressure is well controlled 

## 2013-08-10 ENCOUNTER — Telehealth: Payer: Self-pay | Admitting: *Deleted

## 2013-08-10 ENCOUNTER — Other Ambulatory Visit: Payer: Self-pay | Admitting: *Deleted

## 2013-08-10 NOTE — Telephone Encounter (Signed)
Please advise 

## 2013-08-11 ENCOUNTER — Encounter: Payer: Self-pay | Admitting: Cardiovascular Disease

## 2013-08-13 ENCOUNTER — Encounter: Payer: Self-pay | Admitting: Endocrinology

## 2013-08-13 ENCOUNTER — Other Ambulatory Visit: Payer: Self-pay | Admitting: *Deleted

## 2013-08-13 ENCOUNTER — Ambulatory Visit (INDEPENDENT_AMBULATORY_CARE_PROVIDER_SITE_OTHER): Payer: Medicare Other | Admitting: Endocrinology

## 2013-08-13 VITALS — BP 122/70 | HR 80 | Temp 98.7°F | Resp 12 | Ht 62.0 in | Wt 153.4 lb

## 2013-08-13 DIAGNOSIS — I10 Essential (primary) hypertension: Secondary | ICD-10-CM

## 2013-08-13 DIAGNOSIS — E039 Hypothyroidism, unspecified: Secondary | ICD-10-CM | POA: Insufficient documentation

## 2013-08-13 MED ORDER — EZETIMIBE 10 MG PO TABS: 10.0000 mg | ORAL_TABLET | Freq: Every day | ORAL | Status: DC

## 2013-08-13 NOTE — Progress Notes (Signed)
Patient ID: Jo Parrish, female   DOB: [birth year], 79 y.o.   MRN: [id number]  Reason for Appointment:  Hypothyroidism, followup visit    History of Present Illness:  The hypothyroidism was first diagnosed In the year 1990 or so  She has been on varus regimens of thyroid supplementation but generally complains of fatigue. She has been both levothyroxine and Armour Thyroid    She has been switching the medications but apparently since sometimes she feels better with one or the other She was last seen in 1/14 and was  Told to change her dose  To regimen of Armour Thyroid 60 mg   alternating with 90 mg. At that time her TSH was 1.27   she also has occasional complaints of palpitations at times    She is consistent with her dosage. However about a month ago she was complaining of fatigue, some shakiness and feeling hot and her TSH done in 7/14 by PCP was 0.1   However with reducing her dosage to  50 mg Armour Thyroid daily as of 07/15/13  Her TSH is still low at 0.118 done on 08/06/13 She does feel less tired but still shaky and hot at times   TSH from 07/02/13 done by PCP is 0.1  She is currently taking Armour Thyroid 60 mg, 1 tablet alternating with one and a half  On 7/30 reduce the dose to 1 tablet daily and see me in followup in 6 weeks with labs          Compliance with the medical regimen has been as prescribed with taking the tablet in the morning before breakfast.    Medication List       This list is accurate as of: 08/13/13  3:36 PM.  Always use your most recent med list.               amLODipine 5 MG tablet  Commonly known as:  NORVASC  Take 5 mg by mouth daily.     atenolol 25 MG tablet  Commonly known as:  TENORMIN  Take 25 mg by mouth daily.     cholecalciferol 1000 UNITS tablet  Commonly known as:  VITAMIN D  Take 1,000 Units by mouth daily.     cyanocobalamin 1000 MCG tablet  Take 100 mcg by mouth daily.     ezetimibe 10 MG tablet  Commonly known as:  ZETIA   Take 10 mg by mouth daily.     fish oil-omega-3 fatty acids 1000 MG capsule  Take 2 g by mouth 2 (two) times daily.     irbesartan 300 MG tablet  Commonly known as:  AVAPRO  Take 300 mg by mouth daily.     omeprazole 40 MG capsule  Commonly known as:  PRILOSEC  Take 40 mg by mouth daily.     thyroid 60 MG tablet  Commonly known as:  ARMOUR  Take 60 mg by mouth daily before breakfast.     tizanidine 2 MG capsule  Commonly known as:  ZANAFLEX  2 mg.        Past Medical History  Diagnosis Date  . Nausea     Persistent  . Hyperlipidemia   . Gastritis     Mild  . Hypertension   . Bacterial overgrowth syndrome     Small bowel  . Sjogren's syndrome   . Rheumatoid arthritis(714.0)   . Chronic abdominal pain   . Hypothyroidism   . OSA (obstructive sleep apnea)  Past Surgical History  Procedure Laterality Date  . Appendectomy    . Esophagogastroduodenoscopy  06/07/2008     Gastritis. No celiac sprue/Small hiatal hernia  . Hydrogen breath test   05/17/2008    small bowel bacterial overgrowth/Hydrogen level rise consistent with small bowel bacterial    No family history on file.  Social History:  reports that she has never smoked. She does not have any smokeless tobacco history on file. She reports that she does not drink alcohol or use illicit drugs.  Allergies:  Allergies  Allergen Reactions  . Simvastatin      Examination:   BP 122/70  Pulse 80  Temp(Src) 98.7 F (37.1 C)  Resp 12  Ht 5\' 2"  (1.575 m)  Wt 153 lb 6.4 oz (69.582 kg)  BMI 28.05 kg/m2  SpO2 96%   GENERAL APPEARANCE: Alert And looks well.    FACE: No puffiness of face or  hands       NECK: no thyromegaly.          NEUROLOGIC EXAM: DTRs  normal bilaterally at biceps.    Assessments   Hypothyroidism , long-standing with no significant goiter Currently she is still getting excessive dosage and for some reason is requiring lower doses of supplementation  she is complaining about the  cost of Armour Thyroid and agrees to go back to levothyroxine   Will also need to check her free T4 and T3 levels on the next visit   Treatment:  Change to 50ug levothyroxine  Kennie Karapetian 08/13/2013, 3:36 PM

## 2013-08-13 NOTE — Patient Instructions (Addendum)
Synthroid 50ug daily

## 2013-08-16 MED ORDER — LEVOTHYROXINE SODIUM 50 MCG PO TABS: 50.0000 ug | ORAL_TABLET | Freq: Every day | ORAL | Status: DC

## 2013-08-26 ENCOUNTER — Encounter: Payer: Self-pay | Admitting: Internal Medicine

## 2013-09-24 ENCOUNTER — Other Ambulatory Visit: Payer: Self-pay | Admitting: Orthopedic Surgery

## 2013-09-24 ENCOUNTER — Ambulatory Visit: Payer: Medicare Other | Admitting: Endocrinology

## 2013-09-24 ENCOUNTER — Other Ambulatory Visit: Payer: Medicare Other

## 2013-09-29 ENCOUNTER — Other Ambulatory Visit: Payer: Self-pay | Admitting: Orthopedic Surgery

## 2013-09-30 ENCOUNTER — Encounter (HOSPITAL_COMMUNITY): Payer: Self-pay | Admitting: Pharmacy Technician

## 2013-10-01 ENCOUNTER — Other Ambulatory Visit (HOSPITAL_COMMUNITY): Payer: Self-pay | Admitting: Orthopedic Surgery

## 2013-10-02 ENCOUNTER — Encounter (HOSPITAL_COMMUNITY): Payer: Self-pay

## 2013-10-02 ENCOUNTER — Encounter (HOSPITAL_COMMUNITY)
Admission: RE | Admit: 2013-10-02 | Discharge: 2013-10-02 | Disposition: A | Discharge status: Home / Self Care | Payer: Medicare Other | Source: Ambulatory Visit | Attending: Orthopedic Surgery | Admitting: Orthopedic Surgery

## 2013-10-02 ENCOUNTER — Ambulatory Visit (HOSPITAL_COMMUNITY)
Admission: RE | Admit: 2013-10-02 | Discharge: 2013-10-02 | Disposition: A | Discharge status: Home / Self Care | Payer: Medicare Other | Source: Ambulatory Visit | Attending: Orthopedic Surgery | Admitting: Orthopedic Surgery

## 2013-10-02 DIAGNOSIS — Z01818 Encounter for other preprocedural examination: Secondary | ICD-10-CM | POA: Insufficient documentation

## 2013-10-02 DIAGNOSIS — I10 Essential (primary) hypertension: Secondary | ICD-10-CM | POA: Insufficient documentation

## 2013-10-02 DIAGNOSIS — Z01812 Encounter for preprocedural laboratory examination: Secondary | ICD-10-CM | POA: Insufficient documentation

## 2013-10-02 DIAGNOSIS — M1710 Unilateral primary osteoarthritis, unspecified knee: Secondary | ICD-10-CM | POA: Insufficient documentation

## 2013-10-02 HISTORY — DX: Nausea with vomiting, unspecified: R11.2

## 2013-10-02 HISTORY — DX: Gastro-esophageal reflux disease without esophagitis: K21.9

## 2013-10-02 HISTORY — DX: Adverse effect of unspecified anesthetic, initial encounter: T41.45XA

## 2013-10-02 HISTORY — DX: Other complications of anesthesia, initial encounter: T88.59XA

## 2013-10-02 HISTORY — DX: Other specified postprocedural states: Z98.890

## 2013-10-02 HISTORY — DX: Ulcer of esophagus without bleeding: K22.10

## 2013-10-02 HISTORY — DX: Constipation, unspecified: K59.00

## 2013-10-02 LAB — URINALYSIS, ROUTINE W REFLEX MICROSCOPIC
Glucose, UA: NEGATIVE mg/dL
Leukocytes, UA: NEGATIVE
Nitrite: NEGATIVE
Protein, ur: NEGATIVE mg/dL
Urobilinogen, UA: 0.2 mg/dL (ref 0.0–1.0)
pH: 7.5 (ref 5.0–8.0)

## 2013-10-02 LAB — COMPREHENSIVE METABOLIC PANEL
ALT: 23 U/L (ref 0–35)
AST: 25 U/L (ref 0–37)
CO2: 28 mEq/L (ref 19–32)
Calcium: 9.4 mg/dL (ref 8.4–10.5)
Chloride: 104 mEq/L (ref 96–112)
GFR calc non Af Amer: 74 mL/min — ABNORMAL LOW (ref >90)
Sodium: 140 mEq/L (ref 135–145)
Total Bilirubin: 0.4 mg/dL (ref 0.3–1.2)

## 2013-10-02 LAB — CBC
MCH: 33.8 pg (ref 26.0–34.0)
Platelets: 198 10*3/uL (ref 150–400)
RBC: 3.97 MIL/uL (ref 3.87–5.11)
WBC: 4.5 10*3/uL (ref 4.0–10.5)

## 2013-10-02 LAB — ABO/RH: ABO/RH(D): A POS

## 2013-10-02 LAB — SURGICAL PCR SCREEN: MRSA, PCR: NEGATIVE

## 2013-10-02 NOTE — Patient Instructions (Addendum)
STARLEEN TRUSSELL  10/02/2013                           YOUR PROCEDURE IS SCHEDULED ON:  10/09/13               PLEASE REPORT TO SHORT STAY CENTER AT :  9:45 AM               CALL THIS NUMBER IF ANY PROBLEMS THE DAY OF SURGERY :               [phone]                      REMEMBER:   Do not eat food or drink liquids AFTER MIDNIGHT  May have clear liquids UNTIL 6 HOURS BEFORE SURGERY  (6:45 AM)  Clear liquids include soda, tea, black coffee, apple or grape juice, broth.  Take these medicines the morning of surgery with A SIP OF WATER:  AVAPRO / ATENOLOL / LEVOTHYROXINE / OMEPRAZOLE   Do not wear jewelry, make-up   Do not wear lotions, powders, or perfumes.   Do not shave legs or underarms 12 hrs. before surgery (men may shave face)  Do not bring valuables to the hospital.  Contacts, dentures or bridgework may not be worn into surgery.  Leave suitcase in the car. After surgery it may be brought to your room.  For patients admitted to the hospital more than one night, checkout time is 11:00                          The day of discharge.   Patients discharged the day of surgery will not be allowed to drive home                             If going home same day of surgery, must have someone stay with you first                           24 hrs at home and arrange for some one to drive you home from hospital.    Special Instructions:   Please read over the following fact sheets that you were given:               1. MRSA  INFORMATION                      2. [facility] PREPARING FOR SURGERY SHEET               3. INCENTIVE SPIROMETER                                                X_____________________________________________________________________        Failure to follow these instructions may result in cancellation of your surgery

## 2013-10-07 ENCOUNTER — Encounter: Payer: Self-pay | Admitting: Physician Assistant

## 2013-10-07 ENCOUNTER — Ambulatory Visit (INDEPENDENT_AMBULATORY_CARE_PROVIDER_SITE_OTHER): Payer: Medicare Other | Admitting: Physician Assistant

## 2013-10-07 VITALS — BP 110/78 | HR 81 | Ht 62.0 in | Wt 156.0 lb

## 2013-10-07 DIAGNOSIS — R002 Palpitations: Secondary | ICD-10-CM

## 2013-10-07 NOTE — Assessment & Plan Note (Signed)
Patient's heart rate is currently well-controlled EKG is essentially unchanged from prior. I told her that if she has another episode we will put her on a 30 day heart monitor. I still think she is a low moderate risk for surgery this week and it's okay to proceed.

## 2013-10-07 NOTE — Progress Notes (Signed)
Date:  10/07/2013   ID:  Jo Parrish, DOB [birth year], MRN [id number]  PCP:  Cassell Smiles., MD  Primary Cardiologist:  Allyson Sabal     History of Present Illness: Jo Parrish is a 79 y.o. female, mildly overweight, very active, widowed, Caucasian, mother of 1 child who I last saw 08/06/13. She has a history of hypertension, hyperlipidemia, hypothyroidism, left bundle branch block and lupus. She had a negative Myoview April 2012. Her most recent 2-D echocardiogram was January 2014 showed an ejection fraction of 45%-50% grade 1 diastolic dysfunction. She apparently needs a total knee replacement by Dr. Thurston Hole and has seen Dr. Lequita Halt for a second opinion.  She doesn't have this on October 24. She was seen in our office on December 26, 2012, because of lower extremity edema and was placed on diuretics for several days which resulted in complete resolution of this. It also appears that she is statin intolerant.   Patient presents today because she and felt her heart racing. She says it remained elevated at approximately 106- 119 beats per minute for approximately 25-30 minutes and has some slight dizziness.  She did have her surgery this Friday so she would come in and get evaluated.  She denies nausea, vomiting, fever, chest pain, shortness of breath, orthopnea, PND, cough, congestion, abdominal pain, hematochezia, melena, lower extremity edema, claudication.  Wt Readings from Last 3 Encounters:  10/07/13 156 lb (70.761 kg)  10/02/13 155 lb (70.308 kg)  08/13/13 153 lb 6.4 oz (69.582 kg)     Past Medical History  Diagnosis Date  . Hyperlipidemia   . Hypertension   . Bacterial overgrowth syndrome     Small bowel  . Sjogren's syndrome   . Rheumatoid arthritis(714.0)   . Hypothyroidism   . Complication of anesthesia   . PONV (postoperative nausea and vomiting)   . GERD (gastroesophageal reflux disease)   . Ulcer, esophagus   . Constipation   . OSA (obstructive sleep apnea)     UNABLE TO  TOLERATE  MASK    Current Outpatient Prescriptions  Medication Sig Dispense Refill  . amLODipine (NORVASC) 5 MG tablet Take 5 mg by mouth at bedtime.       Marland Kitchen atenolol (TENORMIN) 25 MG tablet Take 25 mg by mouth every morning.       . diclofenac sodium (VOLTAREN) 1 % GEL Apply 2 g topically 4 (four) times daily as needed (For knee pain.).      Marland Kitchen ezetimibe (ZETIA) 10 MG tablet Take 10 mg by mouth at bedtime.      . irbesartan (AVAPRO) 300 MG tablet Take 300 mg by mouth every morning.       Marland Kitchen levothyroxine (SYNTHROID, LEVOTHROID) 112 MCG tablet Take 112 mcg by mouth daily before breakfast.      . omeprazole (PRILOSEC) 40 MG capsule Take 40 mg by mouth every morning.       . cholecalciferol (VITAMIN D) 1000 UNITS tablet Take 1,000 Units by mouth every morning.       . cyanocobalamin (,VITAMIN B-12,) 1000 MCG/ML injection Inject 1,000 mcg into the muscle every 30 (thirty) days.      . fish oil-omega-3 fatty acids 1000 MG capsule Take 2 g by mouth 2 (two) times daily.      . vitamin B-12 (CYANOCOBALAMIN) 1000 MCG tablet Take 1,000 mcg by mouth every morning.       No current facility-administered medications for this visit.    Allergies:    Allergies  Allergen Reactions  . Statins Other (See Comments)    Leg problems  . Sulfa Antibiotics Other (See Comments)    Reaction unknown    Social History:  The patient  reports that she has never smoked. She does not have any smokeless tobacco history on file. She reports that she does not drink alcohol or use illicit drugs.   Family history:  No family history on file.  ROS:  Please see the history of present illness.  All other systems reviewed and negative.   PHYSICAL EXAM: VS:  BP 110/78  Pulse 81  Ht 5\' 2"  (1.575 m)  Wt 156 lb (70.761 kg)  BMI 28.53 kg/m2 Well nourished, well developed, in no acute distress HEENT: Pupils are equal round react to light accommodation extraocular movements are intact.  Neck: no JVDNo cervical  lymphadenopathy. Cardiac: Regular rate and rhythm without murmurs rubs or gallops. Lungs:  clear to auscultation bilaterally, no wheezing, rhonchi or rales Abd: soft, nontender, positive bowel sounds all quadrants, no hepatosplenomegaly Ext: no lower extremity edema.  2+ radial and dorsalis pedis pulses. Skin: warm and dry Neuro:  Grossly normal  EKG:  Sinus rhythm with left bundle branch block rate 81 beats per minute  ASSESSMENT AND PLAN:  Problem List Items Addressed This Visit   Heart palpitations - Primary     Patient's heart rate is currently well-controlled EKG is essentially unchanged from prior. I told her that if she has another episode we will put her on a 30 day heart monitor. I still think she is a low moderate risk for surgery this week and it's okay to proceed.

## 2013-10-07 NOTE — Patient Instructions (Signed)
I think you are ok for the surgery.  If you experience further palpitations, we should place you on a heart monitor.  Follow up as previously scheduled or sooner if needed.

## 2013-10-08 ENCOUNTER — Other Ambulatory Visit: Payer: Self-pay | Admitting: Orthopedic Surgery

## 2013-10-08 NOTE — H&P (Signed)
Jo Parrish  DOB: 05/11/1926 Widowed / Language: English / Race: White Female  Date of Admission:  10/09/2013  Chief Complaint:  Right Knee Pain  History of Present Illness The patient is a 79 year old female who comes in for a preoperative History and Physical. The patient is scheduled for a right total knee arthroplasty to be performed by Dr. Frank V. Aluisio, MD at [facility] on 10/09/2013. The patient is a 79 year old female who presents for follow up of their knee. The patient is being followed for their right knee pain and osteoarthritis. She said she has had a cyst on the back of her leg that she was seeing Dr. Beane about. She said she saw Dr. Trusslow and he drew fluid off the knee. He recommended she go to [facility] and saw a PA there. She has had the Hyalgan series as well. She has also seen Dr. Wainer a few times and he told her she needed to have a total knee. January 27th, she was scheduled for a total knee. She wanted to come in for a second opinion and Dr. Aluisio sent her to therapy. She said the therapy helped some, but has not addressed the "problem". She said the problem is in her lower leg. She is not able to take NAIDS due to a recent diagnosis, of esophageal ulcers, by Dr. Medoff. She states she feels very unsteady and is afraid she is going to fall. She had an aspiration not too terribly long ago which helped for about a month. The knee has flared back up to the point where it is very difficult to put weight on it. It hurts more in the side of the knee and down the leg on the inner side. She is even sore on the tibia. She has not had any popping but she does get sedentary stiffness. It feels like it is going to buckle but not catching on her. Occasionally she will have pain at night. There is no pain at rest. She does state the knee is quite sensitive in the winter time. Due to the continued issues with the knee she was seen for  evaluation. She has been told she has arthritis in it. She has had previous gel series over at [facility]. She knows and understands that she is going to need surgery with this knee. She has now reached a point where she is ready for surgery. They have been treated conservatively in the past for the above stated problem and despite conservative measures, they continue to have progressive pain and severe functional limitations and dysfunction. They have failed non-operative management including home exercise, medications, and injections. It is felt that they would benefit from undergoing total joint replacement. Risks and benefits of the procedure have been discussed with the patient and they elect to proceed with surgery. There are no active contraindications to surgery such as ongoing infection or rapidly progressive neurological disease.   Problem List Primary osteoarthritis of one knee, right (715.16)   Allergies VoSpire ER *ANTIASTHMATIC AND BRONCHODILATOR AGENTS* ANTIBIOTICS. 03/29/2011 Simvastatin *ANTIHYPERLIPIDEMICS*    Family History Cerebrovascular Accident. First Degree Relatives. mother Hypertension. mother    Social History Tobacco use. Never smoker. never smoker Marital status. widowed Number of flights of stairs before winded. 4-5 Pain Contract. no Illicit drug use. no Drug/Alcohol Rehab (Previously). no Exercise. Exercises rarely Children. 1 Current work status. retired Drug/Alcohol Rehab (Currently). no Post-Surgical Plans. [facility] Advance Directives. Living Will, Healthcare POA      Medication History Avapro ( Oral) Specific dose unknown - Active. Atenolol ( Oral) Specific dose unknown - Active. Levothyroxine Sodium (112MCG Tablet, Oral) Active. Omeprazole Active. Zetia Active. AmLODIPine Besylate (5MG Tablet, Oral) Active.   Past Surgical History Cataract Surgery. bilateral Tonsillectomy. age  9 Appendectomy. age 15    Medical History High blood pressure Hypercholesterolemia Hypothyroidism Sleep Apnea. no CPAP Pneumonia. Past History Hiatal Hernia Hemorrhoids History of Esophageal Ulcers Rheumatoid Arthritis Lupus Measles Sjogren's Disease Chronic Diatsolic Heart Failure    Review of Systems General:Not Present- Chills, Fever, Night Sweats, Fatigue, Weight Gain, Weight Loss and Memory Loss. Skin:Not Present- Hives, Itching, Rash, Eczema and Lesions. HEENT:Not Present- Tinnitus, Headache, Double Vision, Visual Loss, Hearing Loss and Dentures. Respiratory:Not Present- Shortness of breath with exertion, Shortness of breath at rest, Allergies, Coughing up blood and Chronic Cough. Cardiovascular:Not Present- Chest Pain, Racing/skipping heartbeats, Difficulty Breathing Lying Down, Murmur, Swelling and Palpitations. Gastrointestinal:Not Present- Bloody Stool, Heartburn, Abdominal Pain, Vomiting, Nausea, Constipation, Diarrhea, Difficulty Swallowing, Jaundice and Loss of appetitie. Female Genitourinary:Not Present- Blood in Urine, Urinary frequency, Weak urinary stream, Discharge, Flank Pain, Incontinence, Painful Urination, Urgency, Urinary Retention and Urinating at Night. Musculoskeletal:Not Present- Muscle Weakness, Muscle Pain, Joint Swelling, Joint Pain, Back Pain, Morning Stiffness and Spasms. Neurological:Not Present- Tremor, Dizziness, Blackout spells, Paralysis, Difficulty with balance and Weakness. Psychiatric:Not Present- Insomnia.    Vitals Weight: 150 lb Height: 62 in Body Surface Area: 1.73 m Body Mass Index: 27.44 kg/m Pulse: 76 (Regular) Resp.: 14 (Unlabored) BP: 138/80 (Sitting, Left Arm, Standard)   Physical Exam The physical exam findings are as follows:   General Mental Status - Alert, cooperative and good historian. General Appearance- pleasant. Not in acute distress. Orientation- Oriented X3. Build &  Nutrition- Well nourished and Well developed.   Head and Neck Head- normocephalic, atraumatic . Neck Global Assessment- supple. no bruit auscultated on the right and no bruit auscultated on the left.   Eye Pupil- Bilateral- Regular and Round. Motion- Bilateral- EOMI.   Chest and Lung Exam Auscultation: Breath sounds:- clear at anterior chest wall and - clear at posterior chest wall. Adventitious sounds:- No Adventitious sounds.   Cardiovascular Auscultation:Rhythm- Regular rate and rhythm. Heart Sounds- S1 WNL and S2 WNL. Murmurs & Other Heart Sounds:Auscultation of the heart reveals - No Murmurs.   Abdomen Palpation/Percussion:Tenderness- Abdomen is non-tender to palpation. Rigidity (guarding)- Abdomen is soft. Auscultation:Auscultation of the abdomen reveals - Bowel sounds normal.   Female Genitourinary Not done, not pertinent to present illness  Musculoskeletal On exam she is alert and oriented in no apparent distress. Her right knee shows that valgus deformity. Marked crepitus on range of motion of the knee. Range is about 10 to 125, no instability.   Assessment & Plan Primary osteoarthritis of one knee, right (715.16)  Note: Plan is for a Right Total Knee Replacement by Dr. Aluisio.  Plan is to go to [facility] following the hospital stay.  PCP - Dr. Fusco - Patient has been seen preoperatively and felt to be stable for surgery. Cardiology - Dr. J. Berry - Patient has been seen preoperatively and felt to be stable for surgery.  The patient does not have any contraindications and will receive TXA (tranexamic acid) prior to surgery.  Signed electronically by Alexzandrew L Perkins, III PA-C  

## 2013-10-09 ENCOUNTER — Encounter (HOSPITAL_COMMUNITY): Payer: Self-pay | Admitting: *Deleted

## 2013-10-09 ENCOUNTER — Encounter (HOSPITAL_COMMUNITY): Payer: Medicare Other | Admitting: Anesthesiology

## 2013-10-09 ENCOUNTER — Inpatient Hospital Stay (HOSPITAL_COMMUNITY)
Admission: RE | Admit: 2013-10-09 | Discharge: 2013-10-12 | DRG: 470 | Disposition: A | Discharge status: Skilled Nursing Facility | Payer: Medicare Other | Source: Ambulatory Visit | Attending: Orthopedic Surgery | Admitting: Orthopedic Surgery

## 2013-10-09 ENCOUNTER — Inpatient Hospital Stay (HOSPITAL_COMMUNITY): Payer: Medicare Other | Admitting: Anesthesiology

## 2013-10-09 ENCOUNTER — Encounter (HOSPITAL_COMMUNITY)
Admission: RE | Disposition: A | Discharge status: Skilled Nursing Facility | Payer: Self-pay | Source: Ambulatory Visit | Attending: Orthopedic Surgery

## 2013-10-09 DIAGNOSIS — Z6828 Body mass index (BMI) 28.0-28.9, adult: Secondary | ICD-10-CM

## 2013-10-09 DIAGNOSIS — I10 Essential (primary) hypertension: Secondary | ICD-10-CM | POA: Diagnosis present

## 2013-10-09 DIAGNOSIS — E785 Hyperlipidemia, unspecified: Secondary | ICD-10-CM | POA: Diagnosis present

## 2013-10-09 DIAGNOSIS — I5032 Chronic diastolic (congestive) heart failure: Secondary | ICD-10-CM | POA: Diagnosis present

## 2013-10-09 DIAGNOSIS — M179 Osteoarthritis of knee, unspecified: Secondary | ICD-10-CM | POA: Diagnosis present

## 2013-10-09 DIAGNOSIS — E871 Hypo-osmolality and hyponatremia: Secondary | ICD-10-CM | POA: Diagnosis not present

## 2013-10-09 DIAGNOSIS — Z96651 Presence of right artificial knee joint: Secondary | ICD-10-CM

## 2013-10-09 DIAGNOSIS — M1710 Unilateral primary osteoarthritis, unspecified knee: Principal | ICD-10-CM | POA: Diagnosis present

## 2013-10-09 DIAGNOSIS — G4733 Obstructive sleep apnea (adult) (pediatric): Secondary | ICD-10-CM | POA: Diagnosis present

## 2013-10-09 DIAGNOSIS — M3500 Sicca syndrome, unspecified: Secondary | ICD-10-CM | POA: Diagnosis present

## 2013-10-09 DIAGNOSIS — D62 Acute posthemorrhagic anemia: Secondary | ICD-10-CM | POA: Diagnosis not present

## 2013-10-09 DIAGNOSIS — E039 Hypothyroidism, unspecified: Secondary | ICD-10-CM | POA: Diagnosis present

## 2013-10-09 HISTORY — PX: TOTAL KNEE ARTHROPLASTY: SHX125

## 2013-10-09 LAB — TYPE AND SCREEN: ABO/RH(D): A POS

## 2013-10-09 SURGERY — ARTHROPLASTY, KNEE, TOTAL
Anesthesia: Spinal | Site: Knee | Laterality: Right | Wound class: Clean

## 2013-10-09 MED ORDER — METHOCARBAMOL 500 MG PO TABS
500.0000 mg | ORAL_TABLET | Freq: Four times a day (QID) | ORAL | Status: DC | PRN
  Administered 2013-10-12: 10:00:00 500 mg via ORAL
  Filled 2013-10-09: qty 1

## 2013-10-09 MED ORDER — OXYCODONE HCL 5 MG PO TABS
5.0000 mg | ORAL_TABLET | ORAL | Status: DC | PRN
  Administered 2013-10-09 (×2): 10 mg via ORAL
  Administered 2013-10-10: 09:00:00 5 mg via ORAL
  Administered 2013-10-10 (×2): 10 mg via ORAL
  Administered 2013-10-10: 5 mg via ORAL
  Administered 2013-10-11 (×3): 10 mg via ORAL
  Administered 2013-10-12 (×2): 5 mg via ORAL
  Filled 2013-10-09 (×2): qty 2
  Filled 2013-10-09: qty 1
  Filled 2013-10-09 (×4): qty 2
  Filled 2013-10-09: qty 1
  Filled 2013-10-09: qty 2
  Filled 2013-10-09: qty 1
  Filled 2013-10-09: qty 2
  Filled 2013-10-09: qty 1

## 2013-10-09 MED ORDER — MIDAZOLAM HCL 5 MG/5ML IJ SOLN
INTRAMUSCULAR | Status: DC | PRN
  Administered 2013-10-09 (×2): 1 mg via INTRAVENOUS

## 2013-10-09 MED ORDER — EPHEDRINE SULFATE 50 MG/ML IJ SOLN
INTRAMUSCULAR | Status: DC | PRN
  Administered 2013-10-09: 5 mg via INTRAVENOUS

## 2013-10-09 MED ORDER — TRANEXAMIC ACID 100 MG/ML IV SOLN
1000.0000 mg | INTRAVENOUS | Status: AC
  Administered 2013-10-09: 1000 mg via INTRAVENOUS
  Filled 2013-10-09: qty 10

## 2013-10-09 MED ORDER — ACETAMINOPHEN 500 MG PO TABS
1000.0000 mg | ORAL_TABLET | Freq: Four times a day (QID) | ORAL | Status: AC
  Administered 2013-10-09 – 2013-10-10 (×4): 1000 mg via ORAL
  Filled 2013-10-09 (×5): qty 2

## 2013-10-09 MED ORDER — SODIUM CHLORIDE 0.9 % IJ SOLN
INTRAMUSCULAR | Status: AC
  Filled 2013-10-09: qty 50

## 2013-10-09 MED ORDER — DEXTROSE-NACL 5-0.9 % IV SOLN
INTRAVENOUS | Status: DC
  Administered 2013-10-09 – 2013-10-10 (×2): via INTRAVENOUS

## 2013-10-09 MED ORDER — ONDANSETRON HCL 4 MG PO TABS: 4.0000 mg | ORAL_TABLET | Freq: Four times a day (QID) | ORAL | Status: DC | PRN

## 2013-10-09 MED ORDER — BUPIVACAINE HCL 0.25 % IJ SOLN
INTRAMUSCULAR | Status: DC | PRN
  Administered 2013-10-09: 20 mL

## 2013-10-09 MED ORDER — BISACODYL 10 MG RE SUPP: 10.0000 mg | Freq: Every day | RECTAL | Status: DC | PRN

## 2013-10-09 MED ORDER — CHLORHEXIDINE GLUCONATE 4 % EX LIQD: 60.0000 mL | Freq: Once | CUTANEOUS | Status: DC

## 2013-10-09 MED ORDER — DEXAMETHASONE SODIUM PHOSPHATE 10 MG/ML IJ SOLN
10.0000 mg | Freq: Once | INTRAMUSCULAR | Status: AC
  Administered 2013-10-09: 10 mg via INTRAVENOUS

## 2013-10-09 MED ORDER — METHOCARBAMOL 100 MG/ML IJ SOLN
500.0000 mg | Freq: Four times a day (QID) | INTRAVENOUS | Status: DC | PRN
  Administered 2013-10-09: 500 mg via INTRAVENOUS
  Filled 2013-10-09: qty 5

## 2013-10-09 MED ORDER — BUPIVACAINE HCL (PF) 0.75 % IJ SOLN
INTRAMUSCULAR | Status: DC | PRN
  Administered 2013-10-09: 15 mg via INTRATHECAL

## 2013-10-09 MED ORDER — DOCUSATE SODIUM 100 MG PO CAPS
100.0000 mg | ORAL_CAPSULE | Freq: Two times a day (BID) | ORAL | Status: DC
  Administered 2013-10-09 – 2013-10-12 (×6): 100 mg via ORAL

## 2013-10-09 MED ORDER — CEFAZOLIN SODIUM 1-5 GM-% IV SOLN
1.0000 g | Freq: Four times a day (QID) | INTRAVENOUS | Status: AC
  Administered 2013-10-09 (×2): 1 g via INTRAVENOUS
  Filled 2013-10-09 (×2): qty 50

## 2013-10-09 MED ORDER — MENTHOL 3 MG MT LOZG
1.0000 | LOZENGE | OROMUCOSAL | Status: DC | PRN
  Filled 2013-10-09: qty 9

## 2013-10-09 MED ORDER — KETOROLAC TROMETHAMINE 15 MG/ML IJ SOLN: 7.5000 mg | Freq: Four times a day (QID) | INTRAMUSCULAR | Status: AC | PRN

## 2013-10-09 MED ORDER — TRAMADOL HCL 50 MG PO TABS: 50.0000 mg | ORAL_TABLET | Freq: Four times a day (QID) | ORAL | Status: DC | PRN

## 2013-10-09 MED ORDER — ATENOLOL 25 MG PO TABS
25.0000 mg | ORAL_TABLET | Freq: Every morning | ORAL | Status: DC
  Administered 2013-10-10 – 2013-10-12 (×3): 25 mg via ORAL
  Filled 2013-10-09 (×3): qty 1

## 2013-10-09 MED ORDER — ONDANSETRON HCL 4 MG/2ML IJ SOLN: 4.0000 mg | Freq: Four times a day (QID) | INTRAMUSCULAR | Status: DC | PRN

## 2013-10-09 MED ORDER — SODIUM CHLORIDE 0.9 % IJ SOLN
INTRAMUSCULAR | Status: DC | PRN
  Administered 2013-10-09: 13:00:00

## 2013-10-09 MED ORDER — ACETAMINOPHEN 500 MG PO TABS
1000.0000 mg | ORAL_TABLET | Freq: Once | ORAL | Status: AC
  Administered 2013-10-09: 1000 mg via ORAL
  Filled 2013-10-09: qty 2

## 2013-10-09 MED ORDER — FLEET ENEMA 7-19 GM/118ML RE ENEM: 1.0000 | ENEMA | Freq: Once | RECTAL | Status: AC | PRN

## 2013-10-09 MED ORDER — LACTATED RINGERS IV SOLN: INTRAVENOUS | Status: DC

## 2013-10-09 MED ORDER — CEFAZOLIN SODIUM-DEXTROSE 2-3 GM-% IV SOLR
2.0000 g | INTRAVENOUS | Status: AC
  Administered 2013-10-09: 2 g via INTRAVENOUS

## 2013-10-09 MED ORDER — METOCLOPRAMIDE HCL 10 MG PO TABS: 5.0000 mg | ORAL_TABLET | Freq: Three times a day (TID) | ORAL | Status: DC | PRN

## 2013-10-09 MED ORDER — POLYETHYLENE GLYCOL 3350 17 G PO PACK
17.0000 g | PACK | Freq: Every day | ORAL | Status: DC | PRN
  Administered 2013-10-10 – 2013-10-11 (×3): 17 g via ORAL

## 2013-10-09 MED ORDER — ONDANSETRON HCL 4 MG/2ML IJ SOLN
INTRAMUSCULAR | Status: DC | PRN
  Administered 2013-10-09: 4 mg via INTRAVENOUS

## 2013-10-09 MED ORDER — LEVOTHYROXINE SODIUM 112 MCG PO TABS
112.0000 ug | ORAL_TABLET | Freq: Every day | ORAL | Status: DC
  Administered 2013-10-11 – 2013-10-12 (×2): 112 ug via ORAL
  Filled 2013-10-09 (×4): qty 1

## 2013-10-09 MED ORDER — SODIUM CHLORIDE 0.9 % IV SOLN: INTRAVENOUS | Status: DC

## 2013-10-09 MED ORDER — DEXAMETHASONE 6 MG PO TABS
10.0000 mg | ORAL_TABLET | Freq: Every day | ORAL | Status: AC
  Administered 2013-10-10: 09:00:00 10 mg via ORAL
  Filled 2013-10-09: qty 1

## 2013-10-09 MED ORDER — PROPOFOL INFUSION 10 MG/ML OPTIME
INTRAVENOUS | Status: DC | PRN
  Administered 2013-10-09: 50 ug/kg/min via INTRAVENOUS

## 2013-10-09 MED ORDER — ACETAMINOPHEN 650 MG RE SUPP: 650.0000 mg | Freq: Four times a day (QID) | RECTAL | Status: DC | PRN

## 2013-10-09 MED ORDER — 0.9 % SODIUM CHLORIDE (POUR BTL) OPTIME
TOPICAL | Status: DC | PRN
  Administered 2013-10-09: 1000 mL

## 2013-10-09 MED ORDER — PROPOFOL 10 MG/ML IV BOLUS
INTRAVENOUS | Status: DC | PRN
  Administered 2013-10-09: 20 mg via INTRAVENOUS

## 2013-10-09 MED ORDER — DEXAMETHASONE SODIUM PHOSPHATE 10 MG/ML IJ SOLN
10.0000 mg | Freq: Every day | INTRAMUSCULAR | Status: AC
  Filled 2013-10-09: qty 1

## 2013-10-09 MED ORDER — MORPHINE SULFATE 2 MG/ML IJ SOLN: 1.0000 mg | INTRAMUSCULAR | Status: DC | PRN

## 2013-10-09 MED ORDER — CEFAZOLIN SODIUM-DEXTROSE 2-3 GM-% IV SOLR
INTRAVENOUS | Status: AC
  Filled 2013-10-09: qty 50

## 2013-10-09 MED ORDER — BUPIVACAINE LIPOSOME 1.3 % IJ SUSP
20.0000 mL | Freq: Once | INTRAMUSCULAR | Status: DC
  Filled 2013-10-09: qty 20

## 2013-10-09 MED ORDER — RIVAROXABAN 10 MG PO TABS
10.0000 mg | ORAL_TABLET | Freq: Every day | ORAL | Status: DC
  Administered 2013-10-10 – 2013-10-12 (×3): 10 mg via ORAL
  Filled 2013-10-09 (×4): qty 1

## 2013-10-09 MED ORDER — IRBESARTAN 300 MG PO TABS
300.0000 mg | ORAL_TABLET | Freq: Every morning | ORAL | Status: DC
  Administered 2013-10-10 – 2013-10-12 (×3): 300 mg via ORAL
  Filled 2013-10-09 (×3): qty 1

## 2013-10-09 MED ORDER — HYDROMORPHONE HCL PF 1 MG/ML IJ SOLN
INTRAMUSCULAR | Status: AC
  Filled 2013-10-09: qty 1

## 2013-10-09 MED ORDER — PHENOL 1.4 % MT LIQD: 1.0000 | OROMUCOSAL | Status: DC | PRN

## 2013-10-09 MED ORDER — SODIUM CHLORIDE 0.9 % IR SOLN
Status: DC | PRN
  Administered 2013-10-09: 1000 mL

## 2013-10-09 MED ORDER — EZETIMIBE 10 MG PO TABS
10.0000 mg | ORAL_TABLET | Freq: Every day | ORAL | Status: DC
  Administered 2013-10-09 – 2013-10-11 (×3): 10 mg via ORAL
  Filled 2013-10-09 (×4): qty 1

## 2013-10-09 MED ORDER — LACTATED RINGERS IV SOLN
INTRAVENOUS | Status: DC
  Administered 2013-10-09: 13:00:00 via INTRAVENOUS
  Administered 2013-10-09: 1000 mL via INTRAVENOUS

## 2013-10-09 MED ORDER — PANTOPRAZOLE SODIUM 40 MG PO TBEC
80.0000 mg | DELAYED_RELEASE_TABLET | Freq: Every day | ORAL | Status: DC
  Administered 2013-10-10 – 2013-10-12 (×3): 80 mg via ORAL
  Filled 2013-10-09 (×3): qty 2

## 2013-10-09 MED ORDER — ACETAMINOPHEN 325 MG PO TABS
650.0000 mg | ORAL_TABLET | Freq: Four times a day (QID) | ORAL | Status: DC | PRN
  Administered 2013-10-11 – 2013-10-12 (×3): 650 mg via ORAL
  Filled 2013-10-09 (×3): qty 2

## 2013-10-09 MED ORDER — FENTANYL CITRATE 0.05 MG/ML IJ SOLN
INTRAMUSCULAR | Status: DC | PRN
  Administered 2013-10-09 (×2): 50 ug via INTRAVENOUS

## 2013-10-09 MED ORDER — METOCLOPRAMIDE HCL 5 MG/ML IJ SOLN: 5.0000 mg | Freq: Three times a day (TID) | INTRAMUSCULAR | Status: DC | PRN

## 2013-10-09 MED ORDER — AMLODIPINE BESYLATE 5 MG PO TABS
5.0000 mg | ORAL_TABLET | Freq: Every day | ORAL | Status: DC
  Administered 2013-10-09 – 2013-10-11 (×3): 5 mg via ORAL
  Filled 2013-10-09 (×4): qty 1

## 2013-10-09 MED ORDER — KETAMINE HCL 10 MG/ML IJ SOLN
INTRAMUSCULAR | Status: DC | PRN
  Administered 2013-10-09 (×2): 10 mg via INTRAVENOUS

## 2013-10-09 MED ORDER — BUPIVACAINE HCL (PF) 0.25 % IJ SOLN
INTRAMUSCULAR | Status: AC
  Filled 2013-10-09: qty 30

## 2013-10-09 MED ORDER — HYDROMORPHONE HCL PF 1 MG/ML IJ SOLN
0.2500 mg | INTRAMUSCULAR | Status: DC | PRN
  Administered 2013-10-09 (×2): 0.5 mg via INTRAVENOUS

## 2013-10-09 MED ORDER — DIPHENHYDRAMINE HCL 12.5 MG/5ML PO ELIX: 12.5000 mg | ORAL_SOLUTION | ORAL | Status: DC | PRN

## 2013-10-09 SURGICAL SUPPLY — 61 items
BAG SPEC THK2 15X12 ZIP CLS (MISCELLANEOUS) ×1
BAG ZIPLOCK 12X15 (MISCELLANEOUS) ×2 IMPLANT
BANDAGE ELASTIC 6 VELCRO ST LF (GAUZE/BANDAGES/DRESSINGS) ×2 IMPLANT
BANDAGE ESMARK 6X9 LF (GAUZE/BANDAGES/DRESSINGS) ×1 IMPLANT
BLADE SAG 18X100X1.27 (BLADE) ×2 IMPLANT
BLADE SAW SGTL 11.0X1.19X90.0M (BLADE) ×2 IMPLANT
BNDG CMPR 9X6 STRL LF SNTH (GAUZE/BANDAGES/DRESSINGS) ×1
BNDG CMPR MED 15X6 ELC VLCR LF (GAUZE/BANDAGES/DRESSINGS) ×1
BNDG ELASTIC 6X15 VLCR STRL LF (GAUZE/BANDAGES/DRESSINGS) ×1 IMPLANT
BNDG ESMARK 6X9 LF (GAUZE/BANDAGES/DRESSINGS) ×2
BOWL SMART MIX CTS (DISPOSABLE) ×2 IMPLANT
CAPT RP KNEE ×1 IMPLANT
CEMENT HV SMART SET (Cement) ×4 IMPLANT
CLOTH BEACON ORANGE TIMEOUT ST (SAFETY) ×1 IMPLANT
CUFF TOURN SGL QUICK 34 (TOURNIQUET CUFF) ×2
CUFF TRNQT CYL 34X4X40X1 (TOURNIQUET CUFF) ×1 IMPLANT
DECANTER SPIKE VIAL GLASS SM (MISCELLANEOUS) ×2 IMPLANT
DRAPE EXTREMITY T 121X128X90 (DRAPE) ×2 IMPLANT
DRAPE POUCH INSTRU U-SHP 10X18 (DRAPES) ×2 IMPLANT
DRAPE U-SHAPE 47X51 STRL (DRAPES) ×2 IMPLANT
DRSG ADAPTIC 3X8 NADH LF (GAUZE/BANDAGES/DRESSINGS) ×2 IMPLANT
DRSG PAD ABDOMINAL 8X10 ST (GAUZE/BANDAGES/DRESSINGS) ×2 IMPLANT
DURAPREP 26ML APPLICATOR (WOUND CARE) ×2 IMPLANT
ELECT REM PT RETURN 9FT ADLT (ELECTROSURGICAL) ×2
ELECTRODE REM PT RTRN 9FT ADLT (ELECTROSURGICAL) ×1 IMPLANT
EVACUATOR 1/8 PVC DRAIN (DRAIN) ×2 IMPLANT
FACESHIELD LNG OPTICON STERILE (SAFETY) ×10 IMPLANT
GLOVE BIO SURGEON STRL SZ7.5 (GLOVE) IMPLANT
GLOVE BIO SURGEON STRL SZ8 (GLOVE) ×2 IMPLANT
GLOVE BIOGEL PI IND STRL 8 (GLOVE) ×2 IMPLANT
GLOVE BIOGEL PI INDICATOR 8 (GLOVE) ×2
GLOVE SURG SS PI 6.5 STRL IVOR (GLOVE) ×3 IMPLANT
GOWN PREVENTION PLUS LG XLONG (DISPOSABLE) ×3 IMPLANT
GOWN STRL REIN XL XLG (GOWN DISPOSABLE) ×2 IMPLANT
HANDPIECE INTERPULSE COAX TIP (DISPOSABLE) ×2
IMMOBILIZER KNEE 20 (SOFTGOODS) ×2
IMMOBILIZER KNEE 20 THIGH 36 (SOFTGOODS) ×1 IMPLANT
KIT BASIN OR (CUSTOM PROCEDURE TRAY) ×2 IMPLANT
MANIFOLD NEPTUNE II (INSTRUMENTS) ×2 IMPLANT
NDL SAFETY ECLIPSE 18X1.5 (NEEDLE) ×2 IMPLANT
NEEDLE HYPO 18GX1.5 SHARP (NEEDLE) ×4
NS IRRIG 1000ML POUR BTL (IV SOLUTION) ×2 IMPLANT
PACK TOTAL JOINT (CUSTOM PROCEDURE TRAY) ×2 IMPLANT
PADDING CAST ABS 6INX4YD NS (CAST SUPPLIES) ×1
PADDING CAST ABS COTTON 6X4 NS (CAST SUPPLIES) IMPLANT
PADDING CAST COTTON 6X4 STRL (CAST SUPPLIES) ×6 IMPLANT
POSITIONER SURGICAL ARM (MISCELLANEOUS) ×2 IMPLANT
SET HNDPC FAN SPRY TIP SCT (DISPOSABLE) ×1 IMPLANT
SPONGE GAUZE 4X4 12PLY (GAUZE/BANDAGES/DRESSINGS) ×2 IMPLANT
STRIP CLOSURE SKIN 1/2X4 (GAUZE/BANDAGES/DRESSINGS) ×3 IMPLANT
SUCTION FRAZIER 12FR DISP (SUCTIONS) ×2 IMPLANT
SUT MNCRL AB 4-0 PS2 18 (SUTURE) ×2 IMPLANT
SUT VIC AB 2-0 CT1 27 (SUTURE) ×6
SUT VIC AB 2-0 CT1 TAPERPNT 27 (SUTURE) ×3 IMPLANT
SUT VLOC 180 0 24IN GS25 (SUTURE) ×1 IMPLANT
SYR 20CC LL (SYRINGE) ×2 IMPLANT
SYR 50ML LL SCALE MARK (SYRINGE) ×2 IMPLANT
TOWEL OR 17X26 10 PK STRL BLUE (TOWEL DISPOSABLE) ×4 IMPLANT
TRAY FOLEY CATH 14FRSI W/METER (CATHETERS) ×2 IMPLANT
WATER STERILE IRR 1500ML POUR (IV SOLUTION) ×3 IMPLANT
WRAP KNEE MAXI GEL POST OP (GAUZE/BANDAGES/DRESSINGS) ×2 IMPLANT

## 2013-10-09 NOTE — Preoperative (Signed)
Beta Blockers   Reason not to administer Beta Blockers:Took Atenolol this am. 

## 2013-10-09 NOTE — Interval H&P Note (Signed)
History and Physical Interval Note:  10/09/2013 11:51 AM  Keane Scrape  has presented today for surgery, with the diagnosis of right knee osteoarthritis  The various methods of treatment have been discussed with the patient and family. After consideration of risks, benefits and other options for treatment, the patient has consented to  Procedure(s): RIGHT TOTAL KNEE ARTHROPLASTY (Right) as a surgical intervention .  The patient's history has been reviewed, patient examined, no change in status, stable for surgery.  I have reviewed the patient's chart and labs.  Questions were answered to the patient's satisfaction.     Loanne Drilling

## 2013-10-09 NOTE — Anesthesia Postprocedure Evaluation (Signed)
  Anesthesia Post-op Note  Patient: Jo Parrish  Procedure(s) Performed: Procedure(s) (LRB): RIGHT TOTAL KNEE ARTHROPLASTY (Right)  Patient Location: PACU  Anesthesia Type: Spinal  Level of Consciousness: awake and alert   Airway and Oxygen Therapy: Patient Spontanous Breathing  Post-op Pain: mild  Post-op Assessment: Post-op Vital signs reviewed, Patient's Cardiovascular Status Stable, Respiratory Function Stable, Patent Airway and No signs of Nausea or vomiting  Last Vitals:  Filed Vitals:   10/09/13 1445  BP: 149/71  Pulse: 58  Temp: 36.5 C  Resp: 13    Post-op Vital Signs: stable   Complications: No apparent anesthesia complications

## 2013-10-09 NOTE — Op Note (Signed)
Pre-operative diagnosis- Osteoarthritis Right knee(s)  Post-operative diagnosis- Osteoarthritis  Right knee(s)  Procedure-   Right Total Knee Arthroplasty  Surgeon- Gus Rankin. Jahniya Duzan, MD  Assistant- Avel Peace, PA-C   Anesthesia-  Spinal   EBL- * No blood loss amount entered *   Drains Hemovac   Tourniquet time  Total Tourniquet Time Documented: Thigh (Right) - 33 minutes Total: Thigh (Right) - 33 minutes    Complications- None  Condition-PACU - hemodynamically stable.   Brief Clinical Note  Jo Parrish is a 79 y.o. year old female with end stage OA of her right knee with progressively worsening pain and dysfunction. She has constant pain, with activity and at rest and significant functional deficits with difficulties even with ADLs. She has had extensive non-op management including analgesics, injections of cortisone and viscosupplements, and home exercise program, but remains in significant pain with significant dysfunction.Radiographs show bone on bone arthritis lateral and patellofemoral with valgus deformity.. She presents now for right Total Knee Arthroplasty.   Procedure in detail---       The patient is brought into the operating room and positioned supine on the operating table. After successful administration of Spinal anesthetic, a tourniquet is placed high on the Right thigh(s) and the lower extremity is prepped and draped in the usual sterile fashion. Time out is performed by the operating team and then the Right  lower extremity is wrapped in Esmarch, knee flexed and the tourniquet inflated to 300 mmHg.       A midline incision is made with a ten blade through the subcutaneous tissue to the level of the extensor mechanism. A fresh blade is used to make a lateral parapatellar arthrotomy due to the patients' valgus deformity. Soft tissue over the proximal lateral tibia is subperiosteally elevated to the joint line with a knife to the posterolateral corner but not  including the structures of the posterolateral corner. Soft tissue over the proximal medial tibia is elevated with attention being paid to avoiding the patellar tendon on the tibial tubercle. The patella is everted medially, knee flexed 90 degrees and the ACL and PCL are removed. Findings are bone on bone lateral and patellofemoral with large lateral osteophytes. .       The drill is used to create a starting hole in the distal femur and the canal is thoroughly irrigated with sterile saline to remove the fatty contents. The 5 degree Right  valgus alignment guide is placed into the femoral canal and the distal femoral cutting block is pinned to remove 10  mm off the distal femur. Resection is made with an oscillating saw.      The tibia is subluxed forward and the menisci are removed. The extramedullary alignment guide is placed referencing proximally at the medial aspect of the tibial tubercle and distally along the second metatarsal axis and tibial crest. The block is pinned to remove 2mm off the more deficient lateral side. Resection is made with an oscillating saw. Size 2.5  is the most appropriate size for the tibia and the proximal tibia is prepared with the modular drill and keel punch for that size.      The femoral sizing guide is placed and size 2.5  is most appropriate. Rotation is marked off the epicondylar axis and confirmed by creating a rectangular flexion gap at 90 degrees. The size 2.5  cutting block is pinned in this rotation and the anterior, posterior and chamfer cuts are made with the oscillating saw. The intercondylar  block is then placed and that cut is made.      Trial size 2.5  tibial component, trial size 2.5  posterior stabilized femur and a 10  mm posterior stabilized rotating platform insert trial is placed. Full extension is achieved with excellent varus/valgus and   anterior/posterior balance throughout full range of motion. The patella is everted and thickness measured to be 22   mm. Free hand resection is taken to 12 mm, a 35 template is placed, lug holes are drilled, trial patella is placed, and it tracks normally. Osteophytes are removed off the posterior femur with the trial in place. All trials are removed and the cut bone surfaces prepared with pulsatile lavage. Cement is mixed and once ready for implantation, the size 2.5  tibial implant, size 2.5 posterior stabilized femoral component, and the size 35  patella are cemented in place and the patella is held with the clamp. The trial insert is placed and the knee held in full extension. The Exparel (20 ml mixed with 30 ml saline) and then 20 ml of .25% Bupivicaine is injected into the extensor mechanism, posterior capsule, medial and lateral gutters and subcutaneous tissues. All extruded cement is removed and once the cement is hard the permanent 10  mm posterior stabilized rotating platform insert is placed into the tibial tray.      The wound is copiously irrigated with saline solution and the tourniquet is released for a total   tourniquet time of 33  minutes. Bleeding is identified and controlled with electrocautery. The extensor mechanism is closed with interrupted #1 PDS leaving open a small area from the superior to inferior pole of the patella to serve as a mini lateral release. Flexion against gravity is 140  degrees and the patella tracks normally. Subcutaneous tissue is closed with 2.0 vicryl and subcuticular with running 4.0 Monocryl.The incision is cleaned and dried and steri-strips and a bulky sterile dressing are applied. The limb is placed into a knee immobilizer and the patient is awakened and transported to recovery in stable condition.      Please note that a surgical assistant was a medical necessity for this procedure in order to perform it in a safe and expeditious manner. Surgical assistant was necessary to retract the ligaments and vital neurovascular structures to prevent injury to them and also necessary for  proper positioning of the limb to allow for anatomic placement of the prosthesis.    Gus Rankin Jo Abadi, MD    10/09/2013, 1:38 PM

## 2013-10-09 NOTE — Anesthesia Procedure Notes (Signed)
Spinal  Patient location during procedure: OR Start time: 10/09/2013 12:10 PM End time: 10/09/2013 12:20 PM Staffing Anesthesiologist: Ronelle Nigh L Performed by: anesthesiologist  Preanesthetic Checklist Completed: patient identified, site marked, surgical consent, pre-op evaluation, timeout performed, IV checked, risks and benefits discussed and monitors and equipment checked Spinal Block Patient position: sitting Prep: Betadine Patient monitoring: heart rate, continuous pulse ox and blood pressure Approach: right paramedian Location: L3-4 Injection technique: single-shot Needle Needle type: Spinocan  Needle gauge: 22 G Needle length: 9 cm Assessment Sensory level: T6 Additional Notes Expiration date of kit checked and confirmed. Patient tolerated procedure well, without complications.

## 2013-10-09 NOTE — Progress Notes (Signed)
Pt states she saw her cardiologist 10/07/13 because her heart was racing. She said he did an EKG and did clear her for surgery

## 2013-10-09 NOTE — Anesthesia Preprocedure Evaluation (Signed)
Anesthesia Evaluation  Patient identified by MRN, date of birth, ID band Patient awake    Reviewed: Allergy & Precautions, H&P , NPO status , Patient's Chart, lab work & pertinent test results, reviewed documented beta blocker date and time   History of Anesthesia Complications (+) PONV  Airway Mallampati: II TM Distance: >3 FB Neck ROM: full    Dental no notable dental hx. (+) Caps and Dental Advisory Given Caps upper front 2 and bridge lower front:   Pulmonary neg pulmonary ROS, sleep apnea ,  breath sounds clear to auscultation  Pulmonary exam normal       Cardiovascular Exercise Tolerance: Good hypertension, Pt. on medications and Pt. on home beta blockers negative cardio ROS  Rhythm:regular Rate:Normal  Chronic diastolic heart failure. LBBB   Neuro/Psych negative neurological ROS  negative psych ROS   GI/Hepatic negative GI ROS, Neg liver ROS,   Endo/Other  negative endocrine ROSHypothyroidism   Renal/GU negative Renal ROS  negative genitourinary   Musculoskeletal  (+) Arthritis -, Rheumatoid disorders,    Abdominal   Peds  Hematology negative hematology ROS (+)   Anesthesia Other Findings Sjorgren's syndrome  Reproductive/Obstetrics negative OB ROS                           Anesthesia Physical Anesthesia Plan  ASA: III  Anesthesia Plan: Spinal   Post-op Pain Management:    Induction:   Airway Management Planned: Simple Face Mask  Additional Equipment:   Intra-op Plan:   Post-operative Plan:   Informed Consent: I have reviewed the patients History and Physical, chart, labs and discussed the procedure including the risks, benefits and alternatives for the proposed anesthesia with the patient or authorized representative who has indicated his/her understanding and acceptance.   Dental Advisory Given  Plan Discussed with: CRNA and Surgeon  Anesthesia Plan Comments:          Anesthesia Quick Evaluation

## 2013-10-09 NOTE — H&P (View-Only) (Signed)
Jo Parrish  DOB: 06/08/1926 Widowed / Language: [place] / Race: White Female  Date of Admission:  10/09/2013  Chief Complaint:  Right Knee Pain  History of Present Illness The patient is a 79 year old female who comes in for a preoperative History and Physical. The patient is scheduled for a right total knee arthroplasty to be performed by Dr. Gus Rankin. Aluisio, MD at [facility] on 10/09/2013. The patient is a 79 year old female who presents for follow up of their knee. The patient is being followed for their right knee pain and osteoarthritis. She said she has had a cyst on the back of her leg that she was seeing Dr. Shelle Iron about. She said she saw Dr. Dulcy Fanny and he drew fluid off the knee. He recommended she go to [employer] and saw a PA there. She has had the Hyalgan series as well. She has also seen Dr. Thurston Hole a few times and he told her she needed to have a total knee. January 27th, she was scheduled for a total knee. She wanted to come in for a second opinion and Dr. Lequita Halt sent her to therapy. She said the therapy helped some, but has not addressed the "problem". She said the problem is in her lower leg. She is not able to take NAIDS due to a recent diagnosis, of esophageal ulcers, by Dr. Kinnie Scales. She states she feels very unsteady and is afraid she is going to fall. She had an aspiration not too terribly long ago which helped for about a month. The knee has flared back up to the point where it is very difficult to put weight on it. It hurts more in the side of the knee and down the leg on the inner side. She is even sore on the tibia. She has not had any popping but she does get sedentary stiffness. It feels like it is going to buckle but not catching on her. Occasionally she will have pain at night. There is no pain at rest. She does state the knee is quite sensitive in the winter time. Due to the continued issues with the knee she was seen for  evaluation. She has been told she has arthritis in it. She has had previous gel series over at [facility]. She knows and understands that she is going to need surgery with this knee. She has now reached a point where she is ready for surgery. They have been treated conservatively in the past for the above stated problem and despite conservative measures, they continue to have progressive pain and severe functional limitations and dysfunction. They have failed non-operative management including home exercise, medications, and injections. It is felt that they would benefit from undergoing total joint replacement. Risks and benefits of the procedure have been discussed with the patient and they elect to proceed with surgery. There are no active contraindications to surgery such as ongoing infection or rapidly progressive neurological disease.   Problem List Primary osteoarthritis of one knee, right (715.16)   Allergies VoSpire ER *ANTIASTHMATIC AND BRONCHODILATOR AGENTS* ANTIBIOTICS. 03/29/2011 Simvastatin *ANTIHYPERLIPIDEMICS*    Family History Cerebrovascular Accident. First Degree Relatives. mother Hypertension. mother    Social History Tobacco use. Never smoker. never smoker Marital status. widowed Number of flights of stairs before winded. 4-5 Pain Contract. no Illicit drug use. no Drug/Alcohol Rehab (Previously). no Exercise. Exercises rarely Children. 1 Current work status. retired Financial planner (Currently). no Post-Surgical Plans. [facility] Advance Directives. Living Will, Healthcare POA  Medication History Avapro ( Oral) Specific dose unknown - Active. Atenolol ( Oral) Specific dose unknown - Active. Levothyroxine Sodium ( Tablet, Oral) Active. Omeprazole Active. Zetia Active. AmLODIPine Besylate (5MG  Tablet, Oral) Active.   Past Surgical History Cataract Surgery. bilateral Tonsillectomy. age  27 Appendectomy. age 30    Medical History High blood pressure Hypercholesterolemia Hypothyroidism Sleep Apnea. no CPAP Pneumonia. Past History Hiatal Hernia Hemorrhoids History of Esophageal Ulcers Rheumatoid Arthritis Lupus Measles Sjogren's Disease Chronic Diatsolic Heart Failure    Review of Systems General:Not Present- Chills, Fever, Night Sweats, Fatigue, Weight Gain, Weight Loss and Memory Loss. Skin:Not Present- Hives, Itching, Rash, Eczema and Lesions. HEENT:Not Present- Tinnitus, Headache, Double Vision, Visual Loss, Hearing Loss and Dentures. Respiratory:Not Present- Shortness of breath with exertion, Shortness of breath at rest, Allergies, Coughing up blood and Chronic Cough. Cardiovascular:Not Present- Chest Pain, Racing/skipping heartbeats, Difficulty Breathing Lying Down, Murmur, Swelling and Palpitations. Gastrointestinal:Not Present- Bloody Stool, Heartburn, Abdominal Pain, Vomiting, Nausea, Constipation, Diarrhea, Difficulty Swallowing, Jaundice and Loss of appetitie. Female Genitourinary:Not Present- Blood in Urine, Urinary frequency, Weak urinary stream, Discharge, Flank Pain, Incontinence, Painful Urination, Urgency, Urinary Retention and Urinating at Night. Musculoskeletal:Not Present- Muscle Weakness, Muscle Pain, Joint Swelling, Joint Pain, Back Pain, Morning Stiffness and Spasms. Neurological:Not Present- Tremor, Dizziness, Blackout spells, Paralysis, Difficulty with balance and Weakness. Psychiatric:Not Present- Insomnia.    Vitals Weight: 150 lb Height: 62 in Body Surface Area: 1.73 m Body Mass Index: 27.44 kg/m Pulse: 76 (Regular) Resp.: 14 (Unlabored) BP: 138/80 (Sitting, Left Arm, Standard)   Physical Exam The physical exam findings are as follows:   General Mental Status - Alert, cooperative and good historian. General Appearance- pleasant. Not in acute distress. Orientation- Oriented X3. Build &  Nutrition- Well nourished and Well developed.   Head and Neck Head- normocephalic, atraumatic . Neck Global Assessment- supple. no bruit auscultated on the right and no bruit auscultated on the left.   Eye Pupil- Bilateral- Regular and Round. Motion- Bilateral- EOMI.   Chest and Lung Exam Auscultation: Breath sounds:- clear at anterior chest wall and - clear at posterior chest wall. Adventitious sounds:- No Adventitious sounds.   Cardiovascular Auscultation:Rhythm- Regular rate and rhythm. Heart Sounds- S1 WNL and S2 WNL. Murmurs & Other Heart Sounds:Auscultation of the heart reveals - No Murmurs.   Abdomen Palpation/Percussion:Tenderness- Abdomen is non-tender to palpation. Rigidity (guarding)- Abdomen is soft. Auscultation:Auscultation of the abdomen reveals - Bowel sounds normal.   Female Genitourinary Not done, not pertinent to present illness  Musculoskeletal On exam she is alert and oriented in no apparent distress. Her right knee shows that valgus deformity. Marked crepitus on range of motion of the knee. Range is about 10 to 125, no instability.   Assessment & Plan Primary osteoarthritis of one knee, right (715.16)  Note: Plan is for a Right Total Knee Replacement by Dr. Lequita Halt.  Plan is to go to [facility] following the hospital stay.  PCP - Dr. Sherwood Gambler - Patient has been seen preoperatively and felt to be stable for surgery. Cardiology - Dr. Erlene Quan - Patient has been seen preoperatively and felt to be stable for surgery.  The patient does not have any contraindications and will receive TXA (tranexamic acid) prior to surgery.  Signed electronically by Lauraine Rinne, III PA-C

## 2013-10-09 NOTE — Progress Notes (Signed)
CPM to be stared on the floor

## 2013-10-09 NOTE — Transfer of Care (Signed)
Immediate Anesthesia Transfer of Care Note  Patient: Jo Parrish  Procedure(s) Performed: Procedure(s): RIGHT TOTAL KNEE ARTHROPLASTY (Right)  Patient Location: PACU  Anesthesia Type:Spinal  Level of Consciousness: awake, alert , oriented and patient cooperative  Airway & Oxygen Therapy: Patient Spontanous Breathing and Patient connected to face mask oxygen  Post-op Assessment: Report given to PACU RN, Post -op Vital signs reviewed and stable and able to feel and wiggle toes but denies any pain.  Post vital signs: Reviewed and stable  Complications: No apparent anesthesia complications

## 2013-10-09 NOTE — Plan of Care (Signed)
Problem: Consults Goal: Diagnosis- Total Joint Replacement Right total knee     

## 2013-10-10 LAB — BASIC METABOLIC PANEL
BUN: 21 mg/dL (ref 6–23)
Calcium: 8.7 mg/dL (ref 8.4–10.5)
Chloride: 102 mEq/L (ref 96–112)
Creatinine, Ser: 1.1 mg/dL (ref 0.50–1.10)
GFR calc Af Amer: 52 mL/min — ABNORMAL LOW (ref >90)
GFR calc non Af Amer: 44 mL/min — ABNORMAL LOW (ref >90)
Potassium: 4.2 mEq/L (ref 3.5–5.1)

## 2013-10-10 LAB — CBC
HCT: 28.7 % — ABNORMAL LOW (ref 36.0–46.0)
MCHC: 34.8 g/dL (ref 30.0–36.0)
MCV: 95.3 fL (ref 78.0–100.0)
Platelets: 167 10*3/uL (ref 150–400)
RDW: 12.1 % (ref 11.5–15.5)
WBC: 11.4 10*3/uL — ABNORMAL HIGH (ref 4.0–10.5)

## 2013-10-10 NOTE — Evaluation (Signed)
Occupational Therapy Evaluation Patient Details Name: Jo Parrish MRN: [id number] DOB: 04/22/1926 Today's Date: 10/10/2013 Time: 1020-1056 OT Time Calculation (min): 36 min  OT Assessment / Plan / Recommendation History of present illness s/p R TKA   Clinical Impression   Pt presents with dependence in ADL and mobility as expected following R TKA.  Pt lives alone and will need a brief SNF stay for rehab prior to return home.  Will defer further OT to SNF.    OT Assessment  All further OT needs can be met in the next venue of care    Follow Up Recommendations  SNF    Barriers to Discharge      Equipment Recommendations  None recommended by OT    Recommendations for Other Services    Frequency       Precautions / Restrictions Precautions Precautions: Knee;Fall Restrictions Weight Bearing Restrictions: No Other Position/Activity Restrictions: WBAT   Pertinent Vitals/Pain No c/o pain, iced knee, repositioned    ADL  Eating/Feeding: Independent Where Assessed - Eating/Feeding: Chair Grooming: Wash/dry hands;Supervision/safety Where Assessed - Grooming: Unsupported standing Upper Body Bathing: Set up Where Assessed - Upper Body Bathing: Unsupported sitting Lower Body Bathing: Minimal assistance Where Assessed - Lower Body Bathing: Unsupported sitting;Supported sit to stand Upper Body Dressing: Set up Where Assessed - Upper Body Dressing: Unsupported sitting Lower Body Dressing: Minimal assistance Where Assessed - Lower Body Dressing: Unsupported sitting;Supported sit to stand Toilet Transfer: Minimal assistance Toilet Transfer Method: Sit to stand Toilet Transfer Equipment: Comfort height toilet;Grab bars Toileting - Clothing Manipulation and Hygiene: Independent Where Assessed - Glass blower/designer Manipulation and Hygiene: Sit on 3-in-1 or toilet Equipment Used: Knee Immobilizer;Rolling walker;Long-handled shoe horn;Long-handled sponge;Reacher;Sock  aid Transfers/Ambulation Related to ADLs: min assist with RW ADL Comments: Not able to access R knee, educated in use of AE    OT Diagnosis: Generalized weakness;Acute pain  OT Problem List: Impaired balance (sitting and/or standing);Decreased activity tolerance;Decreased knowledge of use of DME or AE;Pain;Obesity OT Treatment Interventions:     OT Goals(Current goals can be found in the care plan section) Acute Rehab OT Goals Patient Stated Goal: to [facility] for brief rehab  Visit Information  Last OT Received On: 10/10/13 Assistance Needed: +1 History of Present Illness: s/p R TKA       Prior Functioning     Home Living Family/patient expects to be discharged to:: Skilled nursing facility Living Arrangements: Alone Prior Function Level of Independence: Independent Communication Communication: No difficulties Dominant Hand: Right         Vision/Perception Vision - History Patient Visual Report: No change from baseline   Cognition  Cognition Arousal/Alertness: Awake/alert Behavior During Therapy: WFL for tasks assessed/performed Overall Cognitive Status: Within Functional Limits for tasks assessed    Extremity/Trunk Assessment Upper Extremity Assessment Upper Extremity Assessment: Overall WFL for tasks assessed Lower Extremity Assessment Lower Extremity Assessment: Defer to PT evaluation RLE Deficits / Details: good quad set, able to perform SLR without knee lag,  AROM -10-50* supine Cervical / Trunk Assessment Cervical / Trunk Assessment: Normal     Mobility Bed Mobility Bed Mobility: Not assessed (pt seated in chair) Supine to Sit: 4: Min assist Details for Bed Mobility Assistance: assist for R LE over EOB, verbal cues for technique Transfers Sit to Stand: 4: Min assist;With upper extremity assist Stand to Sit: 4: Min assist;With upper extremity assist Details for Transfer Assistance: verbal cues for safe technique including hand placement and R LE  forward  Exercise     Balance     End of Session OT - End of Session Activity Tolerance: Patient tolerated treatment well Patient left: in chair;with call bell/phone within reach Nurse Communication:  (R knee bleeding, pt urinated)  GO     Evern Bio 10/10/2013, 11:11 AM

## 2013-10-10 NOTE — Progress Notes (Signed)
Clinical Social Work Department BRIEF PSYCHOSOCIAL ASSESSMENT 10/10/2013  Patient:  Jo Parrish, Jo Parrish     Account Number:  [id number]     Admit date:  10/09/2013  Clinical Social Worker:  Doroteo Glassman  Date/Time:  10/10/2013 10:52 AM  Referred by:  Physician  Date Referred:  10/10/2013 Referred for  SNF Placement   Other Referral:   Interview type:  Patient Other interview type:    PSYCHOSOCIAL DATA Living Status:  ALONE Admitted from facility:   Level of care:   Primary support name:  Beacher May Primary support relationship to patient:  FAMILY Degree of support available:   adequate    CURRENT CONCERNS Current Concerns  Post-Acute Placement   Other Concerns:    SOCIAL WORK ASSESSMENT / PLAN Met with Pt to discuss d/c plans.    Pt stated that, although she's never been to [facility], she has a good friend that works there and the facility has come highly recommended.    Pt stated that she took it upon herself to contact them and that they said that it's likely they'll have a bed available to her upon d/c.    Pt gave CSW permission to send [facility] her information.    Weekday CSW to follow.    CSW thanked Pt for her time.   Assessment/plan status:  Psychosocial Support/Ongoing Assessment of Needs Other assessment/ plan:   Information/referral to community resources:   SNF list    PATIENT'S/FAMILY'S RESPONSE TO PLAN OF CARE: Pt's response to the plan of care was hopeful, as she is keeping her fingers crossed that [facility] will work out for her.    Pt thanked CSW for time and assistance.   Providence Crosby, LCSWA Clinical Social Work [phone]

## 2013-10-10 NOTE — Progress Notes (Signed)
Clinical Social Work Department CLINICAL SOCIAL WORK PLACEMENT NOTE 10/10/2013  Patient:  Jo Parrish, Jo Parrish  Account Number:  [id number] Admit date:  10/09/2013  Clinical Social Worker:  Doroteo Glassman  Date/time:  10/10/2013 10:55 AM  Clinical Social Work is seeking post-discharge placement for this patient at the following level of care:   SKILLED NURSING   (*CSW will update this form in Epic as items are completed)   10/10/2013  Patient/family provided with [facility] Department of Clinical Social Work's list of facilities offering this level of care within the geographic area requested by the patient (or if unable, by the patient's family).  10/10/2013  Patient/family informed of their freedom to choose among providers that offer the needed level of care, that participate in Medicare, Medicaid or managed care program needed by the patient, have an available bed and are willing to accept the patient.  10/10/2013  Patient/family informed of [facility]' ownership interest in [facility], as well as of the fact that they are under no obligation to receive care at this facility.  PASARR submitted to EDS on 10/09/2013 PASARR number received from EDS on 10/09/2013  FL2 transmitted to all facilities in geographic area requested by pt/family on  10/09/2013 FL2 transmitted to all facilities within larger geographic area on 10/09/2013  Patient informed that his/her managed care company has contracts with or will negotiate with  certain facilities, including the following:     Patient/family informed of bed offers received:   Patient chooses bed at  Physician recommends and patient chooses bed at    Patient to be transferred to  on   Patient to be transferred to facility by   The following physician request were entered in Epic:   Additional Comments:  Providence Crosby, Theresia Majors Clinical Social Work [phone]

## 2013-10-10 NOTE — Progress Notes (Signed)
Physical Therapy Treatment Note   10/10/13 1600  PT Visit Information  Last PT Received On 10/10/13  Assistance Needed +1  PT Time Calculation  PT Start Time 1421  PT Stop Time 1446  PT Time Calculation (min) 25 min  Subjective Data  Subjective "I want to do as much therapy as possible"  Precautions  Precautions Knee;Fall  Restrictions  Other Position/Activity Restrictions WBAT  Cognition  Arousal/Alertness Awake/alert  Behavior During Therapy WFL for tasks assessed/performed  Overall Cognitive Status Within Functional Limits for tasks assessed  Bed Mobility  Bed Mobility Sit to Supine  Sit to Supine 5: Supervision;HOB flat  Transfers  Transfers Sit to Stand;Stand to Sit  Sit to Stand 4: Min guard;With upper extremity assist;From chair/3-in-1  Stand to Sit 4: Min guard;With upper extremity assist;To bed  Details for Transfer Assistance verbal cues for safe technique including hand placement and R LE forward  Ambulation/Gait  Ambulation/Gait Assistance 4: Min guard  Ambulation Distance (Feet) 100 Feet  Assistive device Rolling walker  Ambulation/Gait Assistance Details verbal cues for sequence, RW distance, pt took smaller steps this afternoon for pain control, also ambulated with KI due to unable to perform SLR upon entering room prior to mobility  Gait Pattern Step-to pattern;Antalgic  Gait velocity decreased  General Gait Details multiple short standing rest breaks during gait due to pain/fatigue  Exercises  Exercises Total Joint  Total Joint Exercises  Ankle Circles/Pumps AROM;20 reps;Supine;Both  Quad Sets AROM;Right;15 reps;Supine  Gluteal Sets AROM;20 reps;Supine;Both  Short [employer];Right;15 reps;Supine  Heel Slides AAROM;Right;15 reps;Supine  Hip ABduction/ADduction AROM;Right;15 reps;Supine  Straight Leg Raises AROM;15 reps;Supine;Right  PT - End of Session  Equipment Utilized During Treatment Right knee immobilizer  Activity Tolerance Patient tolerated  treatment well  Patient left in bed;with call bell/phone within reach  Nurse Communication Other (comment) (notified RN to reinforce dressing due to bleeding)  PT - Assessment/Plan  PT Plan Current plan remains appropriate  PT Frequency 7X/week  Follow Up Recommendations SNF  PT equipment Rolling walker with 5" wheels  PT Goal Progression  Progress towards PT goals Progressing toward goals  PT General Charges  $$ ACUTE PT VISIT 1 Procedure  PT Treatments  $Gait Training 8-22 mins  $Therapeutic Exercise 8-22 mins  Pt with "sore" R knee during ambulation however no pain at rest.  Pt denies dizziness with activity.   Zenovia Jarred, PT, DPT 10/10/2013 Pager: [phone]

## 2013-10-10 NOTE — Progress Notes (Signed)
   Subjective: 1 Day Post-Op Procedure(s) (LRB): RIGHT TOTAL KNEE ARTHROPLASTY (Right) Patient reports pain as mild.   Patient seen in rounds with Dr. Darrelyn Hillock.  She had a a good night. Patient is well, and has had no acute complaints or problems We will start therapy today.  Plan is to go Skilled nursing facility after hospital stay.  Objective: Vital signs in last 24 hours: Temp:  [97.5 F (36.4 C)-98.6 F (37 C)] 98 F (36.7 C) (10/25 0535) Pulse Rate:  [55-78] 72 (10/25 0535) Resp:  [11-18] 16 (10/25 0535) BP: (102-168)/(56-80) 106/64 mmHg (10/25 0535) SpO2:  [93 %-100 %] 95 % (10/25 0535) Weight:  [70.761 kg (156 lb)] 70.761 kg (156 lb) (10/24 1515)  Intake/Output from previous day:  Intake/Output Summary (Last 24 hours) at 10/10/13 0755 Last data filed at 10/10/13 0755  Gross per 24 hour  Intake 3482.5 ml  Output   1510 ml  Net 1972.5 ml    Intake/Output this shift: Total I/O In: 240 [P.O.:240] Out: -   Labs:  Recent Labs  10/10/13 0453  HGB 10.0*    Recent Labs  10/10/13 0453  WBC 11.4*  RBC 3.01*  HCT 28.7*  PLT 167    Recent Labs  10/10/13 0453  NA 134*  K 4.2  CL 102  CO2 23  BUN 21  CREATININE 1.10  GLUCOSE 179*  CALCIUM 8.7   No results found for this basename: LABPT, INR,  in the last 72 hours  EXAM General - Patient is Alert, Appropriate and Oriented Extremity - Neurovascular intact Sensation intact distally Dorsiflexion/Plantar flexion intact Dressing - dressing C/D/I Motor Function - intact, moving foot and toes well on exam.  Hemovac pulled without difficulty.  Past Medical History  Diagnosis Date  . Hyperlipidemia   . Hypertension   . Bacterial overgrowth syndrome     Small bowel  . Sjogren's syndrome   . Rheumatoid arthritis(714.0)   . Hypothyroidism   . Complication of anesthesia   . PONV (postoperative nausea and vomiting)   . GERD (gastroesophageal reflux disease)   . Ulcer, esophagus   . Constipation   .  OSA (obstructive sleep apnea)     UNABLE TO TOLERATE  MASK    Assessment/Plan: 1 Day Post-Op Procedure(s) (LRB): RIGHT TOTAL KNEE ARTHROPLASTY (Right) Principal Problem:   OA (osteoarthritis) of knee  Estimated body mass index is 28.53 kg/(m^2) as calculated from the following:   Height as of this encounter: 5\' 2"  (1.575 m).   Weight as of this encounter: 70.761 kg (156 lb). Up with therapy Discharge to SNF  DVT Prophylaxis - Xarelto Weight-Bearing as tolerated to right leg D/C O2 and Pulse OX and try on Room Air  PERKINS, Marlowe Sax 10/10/2013, 7:55 AM

## 2013-10-10 NOTE — Evaluation (Signed)
Physical Therapy Evaluation Patient Details Name: Jo Parrish MRN: [id number] DOB: 1926/11/26 Today's Date: 10/10/2013 Time: 4696-2952 PT Time Calculation (min): 15 min  PT Assessment / Plan / Recommendation History of Present Illness     Clinical Impression  Pt is s/p R TKA resulting in the deficits listed below (see PT Problem List).  Pt will benefit from skilled PT to increase their independence and safety with mobility to allow discharge to the venue listed below.       PT Assessment  Patient needs continued PT services    Follow Up Recommendations  SNF    Does the patient have the potential to tolerate intense rehabilitation      Barriers to Discharge        Equipment Recommendations  Rolling walker with 5" wheels    Recommendations for Other Services     Frequency 7X/week    Precautions / Restrictions Precautions Precautions: Knee Restrictions Other Position/Activity Restrictions: WBAT   Pertinent Vitals/Pain None at rest, increased to 3/10 during gait, repositioned, ice packs applied, RN notified of pt request for pain meds      Mobility  Bed Mobility Bed Mobility: Supine to Sit Supine to Sit: 4: Min assist Details for Bed Mobility Assistance: assist for R LE over EOB, verbal cues for technique Transfers Transfers: Sit to Stand;Stand to Sit Sit to Stand: 4: Min assist;With upper extremity assist;From bed Stand to Sit: 4: Min guard;With upper extremity assist;To chair/3-in-1 Details for Transfer Assistance: verbal cues for safe technique including hand placement and R LE forward Ambulation/Gait Ambulation/Gait Assistance: 4: Min assist Ambulation Distance (Feet): 100 Feet Assistive device: Rolling walker Ambulation/Gait Assistance Details: verbal cues for sequence, RW distance, leads with R LE forward slightly during gait, encouraged to WB through UEs on RW as pt reports pain increased to 3/10 during ambulation Gait Pattern: Step-to  pattern;Antalgic Gait velocity: decreased General Gait Details: multiple short standing rest breaks during gait due to pain/fatigue    Exercises     PT Diagnosis: Difficulty walking;Acute pain  PT Problem List: Decreased strength;Decreased range of motion;Decreased mobility;Pain;Decreased knowledge of precautions;Decreased knowledge of use of DME PT Treatment Interventions: Gait training;DME instruction;Functional mobility training;Patient/family education;Therapeutic activities;Therapeutic exercise     PT Goals(Current goals can be found in the care plan section) Acute Rehab PT Goals PT Goal Formulation: With patient Time For Goal Achievement: 10/17/13 Potential to Achieve Goals: Good  Visit Information  Last PT Received On: 10/10/13 Assistance Needed: +1       Prior Functioning  Home Living Family/patient expects to be discharged to:: Skilled nursing facility Living Arrangements: Alone Prior Function Level of Independence: Independent Communication Communication: No difficulties    Cognition  Cognition Arousal/Alertness: Awake/alert Behavior During Therapy: WFL for tasks assessed/performed Overall Cognitive Status: Within Functional Limits for tasks assessed    Extremity/Trunk Assessment Lower Extremity Assessment Lower Extremity Assessment: RLE deficits/detail RLE Deficits / Details: good quad set, able to perform SLR without knee lag,  AROM -10-50* supine   Balance    End of Session PT - End of Session Activity Tolerance: Patient tolerated treatment well Patient left: in chair;with call bell/phone within reach;with family/visitor present Nurse Communication: Patient requests pain meds  GP     Jo Parrish 10/10/2013, 9:21 AM Jo Parrish, PT, DPT 10/10/2013 Pager: [phone]

## 2013-10-11 LAB — BASIC METABOLIC PANEL
Calcium: 8.6 mg/dL (ref 8.4–10.5)
Chloride: 100 mEq/L (ref 96–112)
Creatinine, Ser: 0.87 mg/dL (ref 0.50–1.10)
GFR calc Af Amer: 68 mL/min — ABNORMAL LOW (ref >90)
GFR calc non Af Amer: 59 mL/min — ABNORMAL LOW (ref >90)
Glucose, Bld: 154 mg/dL — ABNORMAL HIGH (ref 70–99)
Potassium: 4.3 mEq/L (ref 3.5–5.1)
Sodium: 131 mEq/L — ABNORMAL LOW (ref 135–145)

## 2013-10-11 LAB — CBC
HCT: 24.8 % — ABNORMAL LOW (ref 36.0–46.0)
Hemoglobin: 8.8 g/dL — ABNORMAL LOW (ref 12.0–15.0)
MCH: 34 pg (ref 26.0–34.0)
MCHC: 35.5 g/dL (ref 30.0–36.0)
Platelets: 166 10*3/uL (ref 150–400)
RDW: 12.2 % (ref 11.5–15.5)
WBC: 11.2 10*3/uL — ABNORMAL HIGH (ref 4.0–10.5)

## 2013-10-11 NOTE — Progress Notes (Signed)
Physical Therapy Treatment Patient Details Name: Jo Parrish MRN: [id number] DOB: 09/13/1926 Today's Date: 10/11/2013 Time: 4098-1191 PT Time Calculation (min): 25 min  PT Assessment / Plan / Recommendation  History of Present Illness s/p R TKA   PT Comments   Pt ambulated in hallway and performed exercises.  Plans to d/c to SNF tomorrow.  Follow Up Recommendations  SNF     Does the patient have the potential to tolerate intense rehabilitation     Barriers to Discharge        Equipment Recommendations  Rolling walker with 5" wheels    Recommendations for Other Services    Frequency 7X/week   Progress towards PT Goals Progress towards PT goals: Progressing toward goals  Plan Current plan remains appropriate    Precautions / Restrictions Precautions Precautions: Knee;Fall Restrictions Other Position/Activity Restrictions: WBAT   Pertinent Vitals/Pain 6/10 R knee pain during gait, 1/10 R knee pain end of session, pt received pain meds, ice packs applied    Mobility  Bed Mobility Bed Mobility: Sit to Supine Supine to Sit: 5: Supervision;With rails Transfers Transfers: Sit to Stand;Stand to Sit Sit to Stand: 4: Min guard;With upper extremity assist;From chair/3-in-1 Stand to Sit: 4: Min guard;With upper extremity assist;To bed Details for Transfer Assistance: verbal cues for R LE forward, pt able to recall hand placement without prompting, min/guard for safety as pt was a little dizzy upon sitting EOB and standing however resolved 1-2 min Ambulation/Gait Ambulation/Gait Assistance: 4: Min guard Ambulation Distance (Feet): 100 Feet Assistive device: Rolling walker Ambulation/Gait Assistance Details: verbal cues for smaller steps for pain control as pt reports rough night due to pain, 6/10 R knee pain with gait, KI during gait as pt unable to perform SLR in bed upon entering room Gait Pattern: Step-to pattern;Antalgic Gait velocity: decreased General Gait Details:  multiple short standing rest breaks during gait due to pain/fatigue    Exercises Total Joint Exercises Ankle Circles/Pumps: AROM;20 reps;Supine;Both Quad Sets: AROM;Right;15 reps;Supine Short Arc Quad: Right;15 reps;Supine;AROM Heel Slides: AAROM;Right;15 reps;Supine Hip ABduction/ADduction: AROM;Right;15 reps;Supine Straight Leg Raises: AROM;Supine;Right;10 reps Goniometric ROM: R knee approx -8-70* AAROM, limited by pain   PT Diagnosis:    PT Problem List:   PT Treatment Interventions:     PT Goals (current goals can now be found in the care plan section)    Visit Information  Last PT Received On: 10/11/13 Assistance Needed: +1 History of Present Illness: s/p R TKA    Subjective Data      Cognition  Cognition Arousal/Alertness: Awake/alert Behavior During Therapy: WFL for tasks assessed/performed Overall Cognitive Status: Within Functional Limits for tasks assessed    Balance     End of Session PT - End of Session Equipment Utilized During Treatment: Right knee immobilizer Activity Tolerance: Patient tolerated treatment well Patient left: in chair;with call bell/phone within reach Nurse Communication: Patient requests pain meds (and RN brought pain meds during session)   GP     Lura Falor,[name] 10/11/2013, 12:53 PM Zenovia Jarred, PT, DPT 10/11/2013 Pager: [phone]

## 2013-10-11 NOTE — Progress Notes (Addendum)
Subjective: 2 Days Post-Op Procedure(s) (LRB): RIGHT TOTAL KNEE ARTHROPLASTY (Right) Patient reports pain as well controlled.  Tolerating medications well. Soreness reported in right knee. Tolerating PO's well and passing flatus. Denies CP, SOB, Calf pain.  Objective: Vital signs in last 24 hours: Temp:  [97.8 F (36.6 C)-98.6 F (37 C)] 98.6 F (37 C) (10/26 0300) Pulse Rate:  [66-85] 85 (10/26 0300) Resp:  [16-20] 20 (10/26 0300) BP: (97-121)/(58-66) 121/66 mmHg (10/26 0300) SpO2:  [93 %-97 %] 97 % (10/26 0300)  Intake/Output from previous day: 10/25 0701 - 10/26 0700 In: 1360.6 [P.O.:720; I.V.:640.6] Out: 1100 [Urine:1100] Intake/Output this shift:     Recent Labs  10/10/13 0453 10/11/13 0430  HGB 10.0* 8.8*    Recent Labs  10/10/13 0453 10/11/13 0430  WBC 11.4* 11.2*  RBC 3.01* 2.59*  HCT 28.7* 24.8*  PLT 167 166    Recent Labs  10/10/13 0453 10/11/13 0430  NA 134* 131*  K 4.2 4.3  CL 102 100  CO2 23 24  BUN 21 28*  CREATININE 1.10 0.87  GLUCOSE 179* 154*  CALCIUM 8.7 8.6   No results found for this basename: LABPT, INR,  in the last 72 hours  Well nourished. Alert and oriented x3. RRR, Lungs clear, BS x4. Abdomen soft and non tender. Right Calf soft and non tender. Right knee dressing changed. No DVT signs. Compartment soft. No signs of infection.  Right LE neurovascular intact.  Assessment/Plan: 2 Days Post-Op Procedure(s) (LRB): RIGHT TOTAL KNEE ARTHROPLASTY (Right) D/C to SNF tomorrow Up with PT Continue current care  Post-op anemia: Monitor Labs PT asymptomatic   Geovani Tootle L 10/11/2013, 8:44 AM

## 2013-10-11 NOTE — Plan of Care (Signed)
Problem: Phase II Progression Outcomes Goal: Discharge plan established Outcome: Completed/Met Date Met:  10/11/13 For SNF

## 2013-10-12 ENCOUNTER — Encounter (HOSPITAL_COMMUNITY): Payer: Self-pay | Admitting: Orthopedic Surgery

## 2013-10-12 ENCOUNTER — Other Ambulatory Visit: Payer: Self-pay | Admitting: *Deleted

## 2013-10-12 ENCOUNTER — Inpatient Hospital Stay
Admission: RE | Admit: 2013-10-12 | Discharge: 2013-10-23 | Disposition: A | Discharge status: Home / Self Care | Payer: Medicare Other | Source: Ambulatory Visit | Attending: Internal Medicine | Admitting: Internal Medicine

## 2013-10-12 DIAGNOSIS — E871 Hypo-osmolality and hyponatremia: Secondary | ICD-10-CM | POA: Diagnosis not present

## 2013-10-12 DIAGNOSIS — D62 Acute posthemorrhagic anemia: Secondary | ICD-10-CM | POA: Diagnosis not present

## 2013-10-12 DIAGNOSIS — R52 Pain, unspecified: Principal | ICD-10-CM

## 2013-10-12 DIAGNOSIS — R140 Abdominal distension (gaseous): Secondary | ICD-10-CM

## 2013-10-12 DIAGNOSIS — R110 Nausea: Secondary | ICD-10-CM

## 2013-10-12 LAB — CBC
Hemoglobin: 8.1 g/dL — ABNORMAL LOW (ref 12.0–15.0)
MCHC: 33.9 g/dL (ref 30.0–36.0)
MCV: 97.6 fL (ref 78.0–100.0)
Platelets: 170 10*3/uL (ref 150–400)
RBC: 2.45 MIL/uL — ABNORMAL LOW (ref 3.87–5.11)
RDW: 12.6 % (ref 11.5–15.5)

## 2013-10-12 MED ORDER — POLYETHYLENE GLYCOL 3350 17 G PO PACK: 17.0000 g | PACK | Freq: Every day | ORAL | Status: DC | PRN

## 2013-10-12 MED ORDER — METOCLOPRAMIDE HCL 5 MG PO TABS: 5.0000 mg | ORAL_TABLET | Freq: Three times a day (TID) | ORAL | Status: DC | PRN

## 2013-10-12 MED ORDER — TRAMADOL HCL 50 MG PO TABS: ORAL_TABLET | ORAL | Status: DC

## 2013-10-12 MED ORDER — OXYCODONE HCL 5 MG PO TABS: ORAL_TABLET | ORAL | Status: DC

## 2013-10-12 MED ORDER — METHOCARBAMOL 500 MG PO TABS: 500.0000 mg | ORAL_TABLET | Freq: Four times a day (QID) | ORAL | Status: DC | PRN

## 2013-10-12 MED ORDER — BISACODYL 10 MG RE SUPP
10.0000 mg | Freq: Once | RECTAL | Status: AC
  Administered 2013-10-12: 08:00:00 10 mg via RECTAL
  Filled 2013-10-12: qty 1

## 2013-10-12 MED ORDER — FLEET ENEMA 7-19 GM/118ML RE ENEM
1.0000 | ENEMA | Freq: Once | RECTAL | Status: AC
  Administered 2013-10-12: 1 via RECTAL
  Filled 2013-10-12: qty 1

## 2013-10-12 MED ORDER — RIVAROXABAN 10 MG PO TABS: 10.0000 mg | ORAL_TABLET | Freq: Every day | ORAL | Status: DC

## 2013-10-12 MED ORDER — OXYCODONE HCL 5 MG PO TABS: 5.0000 mg | ORAL_TABLET | ORAL | Status: DC | PRN

## 2013-10-12 MED ORDER — BISACODYL 10 MG RE SUPP: 10.0000 mg | Freq: Every day | RECTAL | Status: DC | PRN

## 2013-10-12 MED ORDER — TRAMADOL HCL 50 MG PO TABS: 50.0000 mg | ORAL_TABLET | Freq: Four times a day (QID) | ORAL | Status: DC | PRN

## 2013-10-12 MED ORDER — DSS 100 MG PO CAPS: 100.0000 mg | ORAL_CAPSULE | Freq: Two times a day (BID) | ORAL | Status: DC

## 2013-10-12 MED ORDER — ACETAMINOPHEN 325 MG PO TABS: 650.0000 mg | ORAL_TABLET | Freq: Four times a day (QID) | ORAL | Status: DC | PRN

## 2013-10-12 MED ORDER — ONDANSETRON HCL 4 MG PO TABS: 4.0000 mg | ORAL_TABLET | Freq: Four times a day (QID) | ORAL | Status: DC | PRN

## 2013-10-12 NOTE — Progress Notes (Signed)
Clinical Social Work Department CLINICAL SOCIAL WORK PLACEMENT NOTE 10/12/2013  Patient:  Jo Parrish, Jo Parrish  Account Number:  [id number] Admit date:  10/09/2013  Clinical Social Worker:  Doroteo Glassman  Date/time:  10/10/2013 10:55 AM  Clinical Social Work is seeking post-discharge placement for this patient at the following level of care:   SKILLED NURSING   (*CSW will update this form in Epic as items are completed)   10/10/2013  Patient/family provided with [facility] Department of Clinical Social Work's list of facilities offering this level of care within the geographic area requested by the patient (or if unable, by the patient's family).  10/10/2013  Patient/family informed of their freedom to choose among providers that offer the needed level of care, that participate in Medicare, Medicaid or managed care program needed by the patient, have an available bed and are willing to accept the patient.  10/10/2013  Patient/family informed of [facility]' ownership interest in [facility], as well as of the fact that they are under no obligation to receive care at this facility.  PASARR submitted to [employer] on 10/09/2013 PASARR number received from [employer] on 10/09/2013  FL2 transmitted to all facilities in geographic area requested by pt/family on  10/09/2013 FL2 transmitted to all facilities within larger geographic area on 10/09/2013  Patient informed that his/her managed care company has contracts with or will negotiate with  certain facilities, including the following:     Patient/family informed of bed offers received:  10/12/2013 Patient chooses bed at [facility] Physician recommends and patient chooses bed at    Patient to be transferred to [facility] on  10/12/2013 Patient to be transferred to facility by   The following physician request were entered in Epic:   Additional Comments:  Cori Razor LCSW [phone]

## 2013-10-12 NOTE — Discharge Summary (Signed)
Physician Discharge Summary   Patient ID: Jo Parrish MRN: [id number] DOB/AGE: [birth year] 79 y.o.  Admit date: 10/09/2013 Discharge date: 10/12/2013  Primary Diagnosis:  Osteoarthritis Right knee(s)  Admission Diagnoses:  Past Medical History  Diagnosis Date  . Hyperlipidemia   . Hypertension   . Bacterial overgrowth syndrome     Small bowel  . Sjogren's syndrome   . Rheumatoid arthritis(714.0)   . Hypothyroidism   . Complication of anesthesia   . PONV (postoperative nausea and vomiting)   . GERD (gastroesophageal reflux disease)   . Ulcer, esophagus   . Constipation   . OSA (obstructive sleep apnea)     UNABLE TO TOLERATE  MASK   Discharge Diagnoses:   Principal Problem:   OA (osteoarthritis) of knee Active Problems:   Chronic diastolic heart failure   Essential hypertension   Hyperlipidemia   Unspecified hypothyroidism   Postoperative anemia due to acute blood loss   Hyponatremia  Estimated body mass index is 28.53 kg/(m^2) as calculated from the following:   Height as of this encounter: 5\' 2"  (1.575 m).   Weight as of this encounter: 70.761 kg (156 lb).  Procedure:  Procedure(s) (LRB): RIGHT TOTAL KNEE ARTHROPLASTY (Right)   Consults: None  HPI: Jo Parrish is a 79 y.o. year old female with end stage OA of her right knee with progressively worsening pain and dysfunction. She has constant pain, with activity and at rest and significant functional deficits with difficulties even with ADLs. She has had extensive non-op management including analgesics, injections of cortisone and viscosupplements, and home exercise program, but remains in significant pain with significant dysfunction.Radiographs show bone on bone arthritis lateral and patellofemoral with valgus deformity.. She presents now for right Total Knee Arthroplasty.   Laboratory Data: Admission on 10/09/2013  Component Date Value Range Status  . WBC 10/10/2013 11.4* 4.0 - 10.5 K/uL Final  . RBC  10/10/2013 3.01* 3.87 - 5.11 MIL/uL Final  . Hemoglobin 10/10/2013 10.0* 12.0 - 15.0 g/dL Final  . HCT 78/29/5621 28.7* 36.0 - 46.0 % Final  . MCV 10/10/2013 95.3  78.0 - 100.0 fL Final  . MCH 10/10/2013 33.2  26.0 - 34.0 pg Final  . MCHC 10/10/2013 34.8  30.0 - 36.0 g/dL Final  . RDW 30/86/5784 12.1  11.5 - 15.5 % Final  . Platelets 10/10/2013 167  150 - 400 K/uL Final  . Sodium 10/10/2013 134* 135 - 145 mEq/L Final  . Potassium 10/10/2013 4.2  3.5 - 5.1 mEq/L Final  . Chloride 10/10/2013 102  96 - 112 mEq/L Final  . CO2 10/10/2013 23  19 - 32 mEq/L Final  . Glucose, Bld 10/10/2013 179* 70 - 99 mg/dL Final  . BUN 69/62/9528 21  6 - 23 mg/dL Final  . Creatinine, Ser 10/10/2013 1.10  0.50 - 1.10 mg/dL Final  . Calcium 41/32/4401 8.7  8.4 - 10.5 mg/dL Final  . GFR calc non Af Amer 10/10/2013 44* >90 mL/min Final  . GFR calc Af Amer 10/10/2013 52* >90 mL/min Final   Comment: (NOTE)                          The eGFR has been calculated using the CKD EPI equation.                          This calculation has not been validated in all clinical situations.  eGFR's persistently <90 mL/min signify possible Chronic Kidney                          Disease.  . WBC 10/11/2013 11.2* 4.0 - 10.5 K/uL Final  . RBC 10/11/2013 2.59* 3.87 - 5.11 MIL/uL Final  . Hemoglobin 10/11/2013 8.8* 12.0 - 15.0 g/dL Final  . HCT 45/40/9811 24.8* 36.0 - 46.0 % Final  . MCV 10/11/2013 95.8  78.0 - 100.0 fL Final  . MCH 10/11/2013 34.0  26.0 - 34.0 pg Final  . MCHC 10/11/2013 35.5  30.0 - 36.0 g/dL Final  . RDW 91/47/8295 12.2  11.5 - 15.5 % Final  . Platelets 10/11/2013 166  150 - 400 K/uL Final  . Sodium 10/11/2013 131* 135 - 145 mEq/L Final  . Potassium 10/11/2013 4.3  3.5 - 5.1 mEq/L Final  . Chloride 10/11/2013 100  96 - 112 mEq/L Final  . CO2 10/11/2013 24  19 - 32 mEq/L Final  . Glucose, Bld 10/11/2013 154* 70 - 99 mg/dL Final  . BUN 62/13/0865 28* 6 - 23 mg/dL Final  . Creatinine,  Ser 10/11/2013 0.87  0.50 - 1.10 mg/dL Final  . Calcium 78/46/9629 8.6  8.4 - 10.5 mg/dL Final  . GFR calc non Af Amer 10/11/2013 59* >90 mL/min Final  . GFR calc Af Amer 10/11/2013 68* >90 mL/min Final   Comment: (NOTE)                          The eGFR has been calculated using the CKD EPI equation.                          This calculation has not been validated in all clinical situations.                          eGFR's persistently <90 mL/min signify possible Chronic Kidney                          Disease.  . WBC 10/12/2013 9.6  4.0 - 10.5 K/uL Final  . RBC 10/12/2013 2.45* 3.87 - 5.11 MIL/uL Final  . Hemoglobin 10/12/2013 8.1* 12.0 - 15.0 g/dL Final  . HCT 52/84/1324 23.9* 36.0 - 46.0 % Final  . MCV 10/12/2013 97.6  78.0 - 100.0 fL Final  . MCH 10/12/2013 33.1  26.0 - 34.0 pg Final  . MCHC 10/12/2013 33.9  30.0 - 36.0 g/dL Final  . RDW 40/09/2724 12.6  11.5 - 15.5 % Final  . Platelets 10/12/2013 170  150 - 400 K/uL Final  Hospital Outpatient Visit on 10/02/2013  Component Date Value Range Status  . MRSA, PCR 10/02/2013 NEGATIVE  NEGATIVE Final  . Staphylococcus aureus 10/02/2013 NEGATIVE  NEGATIVE Final   Comment:                                 The Xpert SA Assay (FDA                          approved for NASAL specimens                          in patients over 4 years of age),  is one component of                          a comprehensive surveillance                          program.  Test performance has                          been validated by [facility] for patients greater                          than or equal to 10 year old.                          It is not intended                          to diagnose infection nor to                          guide or monitor treatment.  Marland Kitchen aPTT 10/02/2013 31  24 - 37 seconds Final  . WBC 10/02/2013 4.5  4.0 - 10.5 K/uL Final  . RBC 10/02/2013 3.97  3.87 - 5.11 MIL/uL Final    . Hemoglobin 10/02/2013 13.4  12.0 - 15.0 g/dL Final  . HCT 16/09/9603 38.6  36.0 - 46.0 % Final  . MCV 10/02/2013 97.2  78.0 - 100.0 fL Final  . MCH 10/02/2013 33.8  26.0 - 34.0 pg Final  . MCHC 10/02/2013 34.7  30.0 - 36.0 g/dL Final  . RDW 54/08/8118 12.4  11.5 - 15.5 % Final  . Platelets 10/02/2013 198  150 - 400 K/uL Final  . Sodium 10/02/2013 140  135 - 145 mEq/L Final  . Potassium 10/02/2013 4.6  3.5 - 5.1 mEq/L Final  . Chloride 10/02/2013 104  96 - 112 mEq/L Final  . CO2 10/02/2013 28  19 - 32 mEq/L Final  . Glucose, Bld 10/02/2013 89  70 - 99 mg/dL Final  . BUN 14/78/2956 14  6 - 23 mg/dL Final  . Creatinine, Ser 10/02/2013 0.77  0.50 - 1.10 mg/dL Final  . Calcium 21/30/8657 9.4  8.4 - 10.5 mg/dL Final  . Total Protein 10/02/2013 7.1  6.0 - 8.3 g/dL Final  . Albumin 84/69/6295 3.9  3.5 - 5.2 g/dL Final  . AST 28/41/3244 25  0 - 37 U/L Final  . ALT 10/02/2013 23  0 - 35 U/L Final  . Alkaline Phosphatase 10/02/2013 68  39 - 117 U/L Final  . Total Bilirubin 10/02/2013 0.4  0.3 - 1.2 mg/dL Final  . GFR calc non Af Amer 10/02/2013 74* >90 mL/min Final  . GFR calc Af Amer 10/02/2013 86* >90 mL/min Final   Comment: (NOTE)                          The eGFR has been calculated using the CKD EPI equation.                          This calculation  has not been validated in all clinical situations.                          eGFR's persistently <90 mL/min signify possible Chronic Kidney                          Disease.  Marland Kitchen Prothrombin Time 10/02/2013 12.3  11.6 - 15.2 seconds Final  . INR 10/02/2013 0.93  0.00 - 1.49 Final  . ABO/RH(D) 10/02/2013 A POS   Final  . Antibody Screen 10/02/2013 NEG   Final  . Sample Expiration 10/02/2013 10/12/2013   Final  . Color, Urine 10/02/2013 YELLOW  YELLOW Final  . APPearance 10/02/2013 CLEAR  CLEAR Final  . Specific Gravity, Urine 10/02/2013 1.015  1.005 - 1.030 Final  . pH 10/02/2013 7.5  5.0 - 8.0 Final  . Glucose, UA 10/02/2013 NEGATIVE   NEGATIVE mg/dL Final  . Hgb urine dipstick 10/02/2013 NEGATIVE  NEGATIVE Final  . Bilirubin Urine 10/02/2013 NEGATIVE  NEGATIVE Final  . Ketones, ur 10/02/2013 NEGATIVE  NEGATIVE mg/dL Final  . Protein, ur 16/09/9603 NEGATIVE  NEGATIVE mg/dL Final  . Urobilinogen, UA 10/02/2013 0.2  0.0 - 1.0 mg/dL Final  . Nitrite 54/08/8118 NEGATIVE  NEGATIVE Final  . Leukocytes, UA 10/02/2013 NEGATIVE  NEGATIVE Final   MICROSCOPIC NOT DONE ON URINES WITH NEGATIVE PROTEIN, BLOOD, LEUKOCYTES, NITRITE, OR GLUCOSE <1000 mg/dL.  . ABO/RH(D) 10/02/2013 A POS   Final     X-Rays:Dg Chest 2 View  10/02/2013   CLINICAL DATA:  Preop. History of hypertension.  EXAM: CHEST  2 VIEW  COMPARISON:  07/03/2007  FINDINGS: Cardiac silhouette is normal in size and configuration. The aorta is uncoiled. No mediastinal or hilar masses.  The lungs are clear. No pleural effusion or pneumothorax.  The bony thorax is demineralized but intact.  IMPRESSION: No active cardiopulmonary disease.   Electronically Signed   By: Amie Portland M.D.   On: 10/02/2013 12:55    EKG: Orders placed in visit on 08/11/13  . EKG 12-LEAD     Hospital Course: Jo Parrish is a 79 y.o. who was admitted to [facility]. They were brought to the operating room on 10/09/2013 and underwent Procedure(s): RIGHT TOTAL KNEE ARTHROPLASTY.  Patient tolerated the procedure well and was later transferred to the recovery room and then to the orthopaedic floor for postoperative care.  They were given PO and IV analgesics for pain control following their surgery.  They were given 24 hours of postoperative antibiotics of  Anti-infectives   Start     Dose/Rate Route Frequency Ordered Stop   10/09/13 1800  ceFAZolin (ANCEF) IVPB 1 g/50 mL premix     1 g 100 mL/hr over 30 Minutes Intravenous Every 6 hours 10/09/13 1520 10/10/13 0018   10/09/13 0945  ceFAZolin (ANCEF) IVPB 2 g/50 mL premix     2 g 100 mL/hr over 30 Minutes Intravenous On call to O.R. 10/09/13  0940 10/09/13 1223     and started on DVT prophylaxis in the form of Xarelto.   PT and OT were ordered for total joint protocol.  Discharge planning consulted to help with postop disposition and equipment needs.  Social worker also consulted to look into SNF.  Patient had a good night on the evening of surgery.  They started to get up OOB with therapy on day one. Hemovac drain was pulled without difficulty.  Continued  to work with therapy into day two.  Dressing was changed on day two and the incision was healing well.  By day three, the patient had progressed with therapy and meeting their goals.  Incision was healing well.  Patient was seen in rounds and was ready to go to [facility] for continued inpatient rehab.    Discharge Medications: Prior to Admission medications   Medication Sig Start Date End Date Taking? Authorizing Provider  amLODipine (NORVASC) 5 MG tablet Take 5 mg by mouth at bedtime.  07/20/13  Yes Historical Provider, MD  atenolol (TENORMIN) 25 MG tablet Take 25 mg by mouth every morning.    Yes Historical Provider, MD  ezetimibe (ZETIA) 10 MG tablet Take 10 mg by mouth at bedtime.   Yes Historical Provider, MD  irbesartan (AVAPRO) 300 MG tablet Take 300 mg by mouth every morning.  07/20/13  Yes Historical Provider, MD  levothyroxine (SYNTHROID, LEVOTHROID) 112 MCG tablet Take 112 mcg by mouth daily before breakfast.   Yes Historical Provider, MD  omeprazole (PRILOSEC) 40 MG capsule Take 40 mg by mouth every morning.  07/04/13  Yes Historical Provider, MD  acetaminophen (TYLENOL) 325 MG tablet Take 2 tablets (650 mg total) by mouth every 6 (six) hours as needed. 10/12/13   Alexzandrew Perkins, PA-C  bisacodyl (DULCOLAX) 10 MG suppository Place 1 suppository (10 mg total) rectally daily as needed. 10/12/13   Alexzandrew Perkins, PA-C  docusate sodium 100 MG CAPS Take 100 mg by mouth 2 (two) times daily. 10/12/13   Alexzandrew Perkins, PA-C  methocarbamol (ROBAXIN) 500 MG tablet Take 1  tablet (500 mg total) by mouth every 6 (six) hours as needed. 10/12/13   Alexzandrew Julien Girt, PA-C  metoCLOPramide (REGLAN) 5 MG tablet Take 1-2 tablets (5-10 mg total) by mouth every 8 (eight) hours as needed (if ondansetron (ZOFRAN) ineffective.). 10/12/13   Alexzandrew Perkins, PA-C  ondansetron (ZOFRAN) 4 MG tablet Take 1 tablet (4 mg total) by mouth every 6 (six) hours as needed for nausea. 10/12/13   Alexzandrew Perkins, PA-C  oxyCODONE (OXY IR/ROXICODONE) 5 MG immediate release tablet Take 1-2 tablets (5-10 mg total) by mouth every 3 (three) hours as needed. 10/12/13   Alexzandrew Perkins, PA-C  polyethylene glycol (MIRALAX / GLYCOLAX) packet Take 17 g by mouth daily as needed. 10/12/13   Alexzandrew Julien Girt, PA-C  rivaroxaban (XARELTO) 10 MG TABS tablet Take 1 tablet (10 mg total) by mouth daily with breakfast. Take Xarelto for two and a half more weeks, then discontinue Xarelto. Once the patient has completed the blood thinner regimen, then take a Baby 81 mg Aspirin daily for four more weeks. 10/12/13   Alexzandrew Perkins, PA-C  traMADol (ULTRAM) 50 MG tablet Take 1-2 tablets (50-100 mg total) by mouth every 6 (six) hours as needed (mild pain). 10/12/13   Alexzandrew Julien Girt, PA-C    Diet: Cardiac diet Activity:WBAT Follow-up:in 2 weeks Disposition - Skilled nursing facility [facility] Discharged Condition: good   Discharge Orders   Future Orders Complete By Expires   Call MD / Call 911  As directed    Comments:     If you experience chest pain or shortness of breath, CALL 911 and be transported to the hospital emergency room.  If you develope a fever above 101 F, pus (white drainage) or increased drainage or redness at the wound, or calf pain, call your surgeon's office.   Change dressing  As directed    Comments:     Change dressing daily  with sterile 4 x 4 inch gauze dressing and apply TED hose. Do not submerge the incision under water.   Constipation Prevention  As directed     Comments:     Drink plenty of fluids.  Prune juice may be helpful.  You may use a stool softener, such as Colace (over the counter) 100 mg twice a day.  Use MiraLax (over the counter) for constipation as needed.   Diet - low sodium heart healthy  As directed    Discharge instructions  As directed    Comments:     Pick up stool softner and laxative for home. Do not submerge incision under water. May shower. Continue to use ice for pain and swelling from surgery.  Take Xarelto for two and a half more weeks, then discontinue Xarelto. Once the patient has completed the blood thinner regimen, then take a Baby 81 mg Aspirin daily for four more weeks.  When discharged from the skilled rehab facility, please have the facility set up the patient's Home Health Physical Therapy prior to being released.  Also provide the patient with their medications at time of release from the facility to include their pain medication, the muscle relaxants, and their blood thinner medication.  If the patient is still at the rehab facility at time of follow up appointment, please also assist the patient in arranging follow up appointment in our office and any transportation needs.   Do not put a pillow under the knee. Place it under the heel.  As directed    Do not sit on low chairs, stoools or toilet seats, as it may be difficult to get up from low surfaces  As directed    Driving restrictions  As directed    Comments:     No driving until released by the physician.   Increase activity slowly as tolerated  As directed    Lifting restrictions  As directed    Comments:     No lifting until released by the physician.   Patient may shower  As directed    Comments:     You may shower without a dressing once there is no drainage.  Do not wash over the wound.  If drainage remains, do not shower until drainage stops.   TED hose  As directed    Comments:     Use stockings (TED hose) for 3 weeks on both leg(s).  You may  remove them at night for sleeping.   Weight bearing as tolerated  As directed    Questions:     Laterality:     Extremity:         Medication List    STOP taking these medications       cholecalciferol 1000 UNITS tablet  Commonly known as:  VITAMIN D     cyanocobalamin 1000 MCG/ML injection  Commonly known as:  (VITAMIN B-12)     diclofenac sodium 1 % Gel  Commonly known as:  VOLTAREN     fish oil-omega-3 fatty acids 1000 MG capsule     vitamin B-12 1000 MCG tablet  Commonly known as:  CYANOCOBALAMIN      TAKE these medications       acetaminophen 325 MG tablet  Commonly known as:  TYLENOL  Take 2 tablets (650 mg total) by mouth every 6 (six) hours as needed.     amLODipine 5 MG tablet  Commonly known as:  NORVASC  Take 5 mg by mouth at bedtime.  atenolol 25 MG tablet  Commonly known as:  TENORMIN  Take 25 mg by mouth every morning.     bisacodyl 10 MG suppository  Commonly known as:  DULCOLAX  Place 1 suppository (10 mg total) rectally daily as needed.     DSS 100 MG Caps  Take 100 mg by mouth 2 (two) times daily.     ezetimibe 10 MG tablet  Commonly known as:  ZETIA  Take 10 mg by mouth at bedtime.     irbesartan 300 MG tablet  Commonly known as:  AVAPRO  Take 300 mg by mouth every morning.     levothyroxine 112 MCG tablet  Commonly known as:  SYNTHROID, LEVOTHROID  Take 112 mcg by mouth daily before breakfast.     methocarbamol 500 MG tablet  Commonly known as:  ROBAXIN  Take 1 tablet (500 mg total) by mouth every 6 (six) hours as needed.     metoCLOPramide 5 MG tablet  Commonly known as:  REGLAN  Take 1-2 tablets (5-10 mg total) by mouth every 8 (eight) hours as needed (if ondansetron (ZOFRAN) ineffective.).     omeprazole 40 MG capsule  Commonly known as:  PRILOSEC  Take 40 mg by mouth every morning.     ondansetron 4 MG tablet  Commonly known as:  ZOFRAN  Take 1 tablet (4 mg total) by mouth every 6 (six) hours as needed for nausea.      oxyCODONE 5 MG immediate release tablet  Commonly known as:  Oxy IR/ROXICODONE  Take 1-2 tablets (5-10 mg total) by mouth every 3 (three) hours as needed.     polyethylene glycol packet  Commonly known as:  MIRALAX / GLYCOLAX  Take 17 g by mouth daily as needed.     rivaroxaban 10 MG Tabs tablet  Commonly known as:  XARELTO  - Take 1 tablet (10 mg total) by mouth daily with breakfast. Take Xarelto for two and a half more weeks, then discontinue Xarelto.  - Once the patient has completed the blood thinner regimen, then take a Baby 81 mg Aspirin daily for four more weeks.     traMADol 50 MG tablet  Commonly known as:  ULTRAM  Take 1-2 tablets (50-100 mg total) by mouth every 6 (six) hours as needed (mild pain).           Follow-up Information   Follow up with Loanne Drilling, MD. Schedule an appointment as soon as possible for a visit on 10/22/2013.   Specialty:  Orthopedic Surgery   Contact information:   [address] [place] [address] [phone]       Signed: Patrica Duel 10/12/2013, 7:32 AM

## 2013-10-12 NOTE — Progress Notes (Signed)
   Subjective: 3 Days Post-Op Procedure(s) (LRB): RIGHT TOTAL KNEE ARTHROPLASTY (Right) Patient reports pain as mild.   Patient seen in rounds with Dr. Lequita Halt. Patient is well, but has had some minor complaints of pain in the knee at night, requiring pain medications Patient is ready to go [employer] today.  Objective: Vital signs in last 24 hours: Temp:  [97.9 F (36.6 C)-98.3 F (36.8 C)] 97.9 F (36.6 C) (10/27 0458) Pulse Rate:  [68-82] 82 (10/27 0458) Resp:  [16-20] 16 (10/27 0458) BP: (105-123)/(61-68) 115/67 mmHg (10/27 0458) SpO2:  [94 %-97 %] 94 % (10/27 0458)  Intake/Output from previous day:  Intake/Output Summary (Last 24 hours) at 10/12/13 0724 Last data filed at 10/12/13 0510  Gross per 24 hour  Intake    960 ml  Output   2625 ml  Net  -1665 ml    Intake/Output this shift:    Labs:  Recent Labs  10/10/13 0453 10/11/13 0430 10/12/13 0515  HGB 10.0* 8.8* 8.1*    Recent Labs  10/11/13 0430 10/12/13 0515  WBC 11.2* 9.6  RBC 2.59* 2.45*  HCT 24.8* 23.9*  PLT 166 170    Recent Labs  10/10/13 0453 10/11/13 0430  NA 134* 131*  K 4.2 4.3  CL 102 100  CO2 23 24  BUN 21 28*  CREATININE 1.10 0.87  GLUCOSE 179* 154*  CALCIUM 8.7 8.6   No results found for this basename: LABPT, INR,  in the last 72 hours  EXAM: General - Patient is Alert, Appropriate and Oriented Extremity - Neurovascular intact Sensation intact distally Dorsiflexion/Plantar flexion intact No cellulitis present Incision - clean, dry, no drainage, healing Motor Function - intact, moving foot and toes well on exam.   Assessment/Plan: 3 Days Post-Op Procedure(s) (LRB): RIGHT TOTAL KNEE ARTHROPLASTY (Right) Procedure(s) (LRB): RIGHT TOTAL KNEE ARTHROPLASTY (Right) Past Medical History  Diagnosis Date  . Hyperlipidemia   . Hypertension   . Bacterial overgrowth syndrome     Small bowel  . Sjogren's syndrome   . Rheumatoid arthritis(714.0)   . Hypothyroidism   .  Complication of anesthesia   . PONV (postoperative nausea and vomiting)   . GERD (gastroesophageal reflux disease)   . Ulcer, esophagus   . Constipation   . OSA (obstructive sleep apnea)     UNABLE TO TOLERATE  MASK   Principal Problem:   OA (osteoarthritis) of knee  Estimated body mass index is 28.53 kg/(m^2) as calculated from the following:   Height as of this encounter: 5\' 2"  (1.575 m).   Weight as of this encounter: 70.761 kg (156 lb). Advance diet Discharge to SNF Diet - Cardiac diet Follow up - in 2 weeks Activity - WBAT Disposition - Skilled nursing facility Condition Upon Discharge - Good D/C Meds - See DC Summary DVT Prophylaxis - Xarelto  Darlisha Kelm 10/12/2013, 7:24 AM

## 2013-10-12 NOTE — Progress Notes (Signed)
Physical Therapy Treatment Patient Details Name: Jo Parrish MRN: [id number] DOB: 01-16-1926 Today's Date: 10/12/2013 Time: 5284-1324 PT Time Calculation (min): 10 min  PT Assessment / Plan / Recommendation  History of Present Illness s/p R TKA   PT Comments   Pt re[ports feeling constipated and feels terrible. Pt did ambulate in hall. Plans SNF.  Follow Up Recommendations  SNF     Does the patient have the potential to tolerate intense rehabilitation     Barriers to Discharge        Equipment Recommendations       Recommendations for Other Services    Frequency 7X/week   Progress towards PT Goals Progress towards PT goals: Progressing toward goals  Plan Current plan remains appropriate    Precautions / Restrictions Precautions Required Braces or Orthoses: Knee Immobilizer - Right   Pertinent Vitals/Pain Minimal pain. Had meds.    Mobility  Bed Mobility Supine to Sit: 4: Min assist Details for Bed Mobility Assistance: assist for R LE over EOB, verbal cues for technique Transfers Sit to Stand: 4: Min assist;From bed;With upper extremity assist Details for Transfer Assistance: verbal cues for R LE forward, pt able to recall hand placement without prompting, min/guard for safety as pt was a little dizzy upon sitting EOB and standing however resolved 1-2 min. Pt c/o feeling poorly today. Ambulation/Gait Ambulation/Gait Assistance: 4: Min assist Ambulation Distance (Feet): 60 Feet (and 40'.) Assistive device: Rolling walker Ambulation/Gait Assistance Details: Pt stopped several times to rest. Asked to sit down after 60'. Pt appears not feeling well and relates to not having BM. Gait Pattern: Step-to pattern;Antalgic Gait velocity: decreased General Gait Details: multiple short standing rest breaks during gait due to pain/fatigue    Exercises     PT Diagnosis:    PT Problem List:   PT Treatment Interventions:     PT Goals (current goals can now be found in the care  plan section)    Visit Information  Last PT Received On: 10/12/13 Assistance Needed: +1 History of Present Illness: s/p R TKA    Subjective Data      Cognition  Cognition Arousal/Alertness: Awake/alert Behavior During Therapy: Anxious    Balance     End of Session PT - End of Session Equipment Utilized During Treatment: Right knee immobilizer Activity Tolerance: Patient limited by fatigue;Treatment limited secondary to medical complications (Comment) Patient left: in bed;with call bell/phone within reach Nurse Communication: Mobility status   GP     Rada Hay 10/12/2013, 11:44 AM Blanchard Kelch PT [phone]

## 2013-10-14 ENCOUNTER — Non-Acute Institutional Stay (SKILLED_NURSING_FACILITY): Payer: Medicare Other | Admitting: Internal Medicine

## 2013-10-14 DIAGNOSIS — D62 Acute posthemorrhagic anemia: Secondary | ICD-10-CM

## 2013-10-14 DIAGNOSIS — Z96651 Presence of right artificial knee joint: Secondary | ICD-10-CM

## 2013-10-14 DIAGNOSIS — K567 Ileus, unspecified: Secondary | ICD-10-CM

## 2013-10-14 DIAGNOSIS — I5032 Chronic diastolic (congestive) heart failure: Secondary | ICD-10-CM

## 2013-10-14 DIAGNOSIS — Z96659 Presence of unspecified artificial knee joint: Secondary | ICD-10-CM

## 2013-10-14 DIAGNOSIS — K929 Disease of digestive system, unspecified: Secondary | ICD-10-CM

## 2013-10-14 DIAGNOSIS — K560 Paralytic ileus: Secondary | ICD-10-CM

## 2013-10-14 NOTE — Progress Notes (Signed)
Patient ID: Jo Parrish, female   DOB: 1926/07/28, 79 y.o.   MRN: [id number]  Facility; [facility] Chief complaint; admission to SNF post admit to [facility] from October 24 to October 27  History; this patient is a very pleasant 79 year old woman who lives independently just outside of [place]. She was admitted for an elective right total knee replacement. The patient is listed as having postoperative nausea and vomiting. She states that she has not been able to eat and hasn't been drinking particularly well. Today she tells me that her abdomen is distended. She has not voided since last night and has had 4 cups of water in an attempt to avoid. Drinda Butts she appears to have had a benign postoperative course. She is on xarelto for DVT prophylaxis.  Past Medical History  Diagnosis Date  . Hyperlipidemia   . Hypertension   . Bacterial overgrowth syndrome     Small bowel  . Sjogren's syndrome   . Rheumatoid arthritis(714.0)   . Hypothyroidism   . Complication of anesthesia   . PONV (postoperative nausea and vomiting)   . GERD (gastroesophageal reflux disease)   . Ulcer, esophagus   . Constipation   . OSA (obstructive sleep apnea)     UNABLE TO TOLERATE  MASK   Past Surgical History  Procedure Laterality Date  . Appendectomy    . Esophagogastroduodenoscopy  06/07/2008     Gastritis. No celiac sprue/Small hiatal hernia  . Hydrogen breath test   05/17/2008    small bowel bacterial overgrowth/Hydrogen level rise consistent with small bowel bacterial  . Tonsillectomy    . Dilation and curettage of uterus    . Total knee arthroplasty Right 10/09/2013    Procedure: RIGHT TOTAL KNEE ARTHROPLASTY;  Surgeon: Loanne Drilling, MD;  Location: [facility];  Service: Orthopedics;  Laterality: Right;   Medications; Norvasc 5 mg daily, atenolol 25 every morning, Zetia 10 mg each bedtime, Avapro 300 mg every morning, Synthroid 112 mcg daily, omeprazole 40 mg by mouth every morning, Tylenol 650 every 4 when  necessary, Dulcolax suppository 1 PR daily when necessary, Colace 100 twice a day, Robaxin 500 every 6 when necessary, Reglan 5 mg every 8 when necessary, Zofran 4 mg by mouth every 6 hours when necessary, oxycodone 5 mg every 3 hours when necessary, MiraLax 17 g when necessary, xarelto 10 mg daily for 2-1/2 more weeks then discontinue  Socially; patient still works part-time independent with ADLs and I[employer]. Patient still drives History   Social History  . Marital Status: Widowed    Spouse Name: N/A    Number of Children: N/A  . Years of Education: N/A   Occupational History  . Not on file.   Social History Main Topics  . Smoking status: Never Smoker   . Smokeless tobacco: Not on file  . Alcohol Use: No  . Drug Use: No  . Sexual Activity: Not on file   Other Topics Concern  . Not on file   Social History Narrative  . No narrative on file   Family history; none of relevance reported by the patient  Review of systems; Respiratory; no cough no shortness of breath Cardiac no chest pain no exertional chest pain GI; patient reports postoperative "dry heaving" she has not been able to eat since surgery. Not drinking very well either but in response to her poor urine output she has had 4 cups of water today GU has not voided since last night feels she might need to avoid  no clear dysuria Musculoskeletal; patient states her rheumatoid arthritis is "only according to her blood work" Sjogren's syndrome symptoms are not that bothersome. She does not describe serious left knee her shoulder problem which should help her her current rehabilitation.  Physical examination Gen. ; patient does not appear to be in any distress alert and responsive Vitals; O2 sat 96% pulse 77 respirations 18 HEENT mucous membranes appear to be normal and moist Respiratory clear entry bilaterally Cardiac heart sounds are normal no gallops no murmurs she appears to be euvolemic Abdomen; moderately distended however  bowel sounds are positive. There is no liver no spleen no tenderness Rectal; internal hemorrhoids stool is OB negative. No evidence of a distal impaction GU; in spite of a worrisome history her bladder does not appear to be that distended at this point and I don't think she needs to be catheterized Musculoskeletal; right knee incision is intact without overt infection there is a slight knee joint effusion ecchymosis of her lower leg on the right suggestive of intraoperative blood loss no evidence of DVT  Impression/plan #1 postoperative ileus. Her electrolytes will need to be checked. I am going to give her regular mure MiraLax and regular Dulcolax suppositories instead of when necessary. At this point I think this is reasonable in mild and as long as we can mobilize the gas I think she should do fine and her appetite will return. #2 voiding difficulties/unable to void. I see no evidence at the bedside of significant urinary retention. Think this lady's simply might be behind in her fluid intake. I will check her electrolytes, BUN and creatinine. I think she we should see if we can get her up in the commode to see if she can void #3 status post right total knee replacement; this appears to be stable I don't believe there is any evidence of a DVT currently however there was some postoperative bleeding she is on xarelto for DVT prophylaxis her postoperative hemoglobin was 8.1. Preoperative value was normal #4 gastroesophageal reflux disease with a past history of esophageal ulceration she is on proton pump inhibitors #5 history of obstructive sleep apnea apparently unable to tolerate nocturnal respiratory support. #6 essential hypertension #7 chronic diastolic heart failure I see no evidence of this currently  The major issue here is to monitor her abdominal distention. I'm going to try to move things along with Dulcolax suppository and regular MiraLax. I think if we can relieve the distention and her  anorexia will resolve. At this point I think we should continue to monitor her urine output she does not appear to have obvious urinary retention

## 2013-10-16 ENCOUNTER — Ambulatory Visit (HOSPITAL_COMMUNITY)
Admit: 2013-10-16 | Discharge: 2013-10-16 | Disposition: A | Discharge status: Home / Self Care | Payer: Medicare Other | Attending: Internal Medicine | Admitting: Internal Medicine

## 2013-10-16 ENCOUNTER — Non-Acute Institutional Stay (SKILLED_NURSING_FACILITY): Payer: Medicare Other | Admitting: Internal Medicine

## 2013-10-16 DIAGNOSIS — L02419 Cutaneous abscess of limb, unspecified: Secondary | ICD-10-CM

## 2013-10-16 DIAGNOSIS — K5900 Constipation, unspecified: Secondary | ICD-10-CM | POA: Insufficient documentation

## 2013-10-16 DIAGNOSIS — I10 Essential (primary) hypertension: Secondary | ICD-10-CM

## 2013-10-16 DIAGNOSIS — D62 Acute posthemorrhagic anemia: Secondary | ICD-10-CM

## 2013-10-16 DIAGNOSIS — L03115 Cellulitis of right lower limb: Secondary | ICD-10-CM | POA: Insufficient documentation

## 2013-10-16 NOTE — Progress Notes (Signed)
Patient ID: Jo Parrish, female   DOB: 04/12/1926, 79 y.o.   MRN: [id number]  This is an acute visit.  Level care skilled.  Facility [facility].  Chief complaint-acute visit secondary to erythema right knee-dollop anemia.  History of present illness.  Patient is an 79 year old female here for rehabilitation after undergoing a right knee replacement secondary to end-stage osteoarthritis.  Belly she did have some postop nausea and vomiting with a poor appetite.  She's also complained of constipation Dr. Leanord Hawking did assess this 2 days ago on her admission here and did order Dulcolax suppository when necessary and MiraLax routine-she states she had a bowel movement yesterday but still is concerned--of note she is also on Colace twice a day.  Lab was done yesterday which was concerning for hemoglobin of 7.3-it appeared to be trending down in the hospital per chart review and was 8.1 on October 27-Dr. Leanord Hawking did assess this and she has been started on iron-with the thought that this is most likely postop bleeding into the knee  Nursing staff has noted some erythema extending laterally from the incision site on the right knee and would like it assessed.  Her main complaint today is constipation and feeling weak does not complaining of chest pain or shortness of breath.  Family medical social history has been reviewed per admission note on 10/14/2013.  Medications have been reviewed per MAR.  Review of systems.  In general other than feeling weak and constipated she does not complaining of any fever or chills.  Respiratory no complaints shortness of breath or cough.  Cardiac no complaints of chest pain or palpitations.  GI does express concern about constipation although she denies abdominal pain she did have a bowel movement yesterday.   GU-initially some concerns of urinary retention but apparently this has subsided  Muscle skeletal-at times complains of knee pain she is receiving oxycodone  and Ultram as well as Robaxin.  Neurologic-does not complaining of numbness headache or dizziness.  Physical exam.  Temperature is 90.5 pulse 83 respirations 18 blood pressure variable 95/53 most recently 103/51- O2 saturation is 94% on room air  In general this is an elderly female in no distress resting comfortably in bed.  Her skin is warm and dry a do note right knee incision site laterally there is an area of erythema several centimeters wide   extending laterally from the incision site. This is slightly warm.  Chest is clear to auscultation without any labored breathing.  Heart is regular rate and rhythm without murmur gallop or rub other than the edema around the knee I do not really see lower extremity edema pedal pulses are intact bilaterally.  Abdomen is obese soft does not appear to be acutely tender there are active bowel sounds.  GU-I do not really see suprapubic distention or any drainage here.  Muscle skeletal right knee incision as noted above. Able to move her extremities at baseline with limited movement of her right leg secondary to the surgery.  Neurologic is grossly intact her speech is clear no lateralizing findings.  Labs  10/15/2013-.  Most notable for hemoglobin of 7.3 also noted a sodium on metabolic panel of 131 which appeared to be somewhat baseline with hospital values with mild hyponatremia W. BC was 6.6 hemoglobin 7.3 platelets 198.  Sodium 131 potassium 4.1 BUN 17 creatinine 0.78.  Liver function tests showed an AST mildly elevated at 49 and ALT of 76.  Albumin was 2.4.  Magnesium was 2 point  Assessment and plan.  #1-history status post right knee replacement with erythema-some concern for developing cellulitis here-Will start doxycycline 100 mg twice a day for 7 days and monitor this-.  #2-low blood pressure readings-I do note she is on Norvasc 5 mg a day as well as atenolol 25 mg in the morning as well as Avapro 300 mg every  morning.-She appears asymptomatic-We'll DC the Norvasc also continue to monitor blood pressures closely --and hold Avapro for systolic less than 100.  #3-anemia-thought possibly postop blood loss-Dr. Leanord Hawking has addressed this and ordered a CBC on Monday, November 3-clinically she appears stable she has been started on iron-she is concerned about this causing constipation-will have to monitor she did have a bowel movement yesterday she has fleets enema when necessary --also Dulcolax suppository when necessary as well as MiraLax routinely-she also continues on Colace 100 mg twice a day  [id number]  --

## 2013-10-19 ENCOUNTER — Telehealth: Payer: Self-pay | Admitting: Gastroenterology

## 2013-10-19 ENCOUNTER — Ambulatory Visit (HOSPITAL_COMMUNITY)
Admit: 2013-10-19 | Discharge: 2013-10-19 | Disposition: A | Discharge status: Home / Self Care | Payer: Medicare Other | Attending: Internal Medicine | Admitting: Internal Medicine

## 2013-10-19 ENCOUNTER — Non-Acute Institutional Stay (SKILLED_NURSING_FACILITY): Payer: Medicare Other | Admitting: Internal Medicine

## 2013-10-19 DIAGNOSIS — L03115 Cellulitis of right lower limb: Secondary | ICD-10-CM

## 2013-10-19 DIAGNOSIS — R141 Gas pain: Secondary | ICD-10-CM | POA: Insufficient documentation

## 2013-10-19 DIAGNOSIS — K929 Disease of digestive system, unspecified: Secondary | ICD-10-CM

## 2013-10-19 DIAGNOSIS — R142 Eructation: Secondary | ICD-10-CM | POA: Insufficient documentation

## 2013-10-19 DIAGNOSIS — L02419 Cutaneous abscess of limb, unspecified: Secondary | ICD-10-CM

## 2013-10-19 DIAGNOSIS — R110 Nausea: Secondary | ICD-10-CM | POA: Insufficient documentation

## 2013-10-19 DIAGNOSIS — K560 Paralytic ileus: Secondary | ICD-10-CM

## 2013-10-19 DIAGNOSIS — K567 Ileus, unspecified: Secondary | ICD-10-CM

## 2013-10-19 NOTE — Telephone Encounter (Signed)
I spoke to pt's friend, Clarisa Fling [phone]). She said the pt had knee replacement on 10/09/2013. She is in [facility] and is very sick. Said she cannot eat she has so much nausea. She wants Dr. Darrick Penna to come to see her, but I told her we do not round over there. I will let Dr. Darrick Penna know. She said pt is so weak that she said she is going to die if she does not get any help. ( Room 159 at [facility]). Pt was in tears worried about pt. Please advise!

## 2013-10-19 NOTE — Telephone Encounter (Addendum)
CALLED [facility]. SPOKE TO OLIVIA. PT HAS AN APPT WITH [facility]. ADMITTED TO [facility] WITH NAUSEA AFTER KNEE SURGERY. ON OXYCODONE AND TRAMADOL. PT HAVING CONSTIPATION, NAUSEA, AND. NAUSEATED BEFORE TAKING ABX(DOXYCYCLINE). LABS WERE DONE. SEEN BY PA FRI. SEEN DR. Leanord Hawking MON. PT IS UP WALKING AROUND. PT IS IN NO ACUTE DISTRESS. XRAY OF ABD TODAY. HAD A LOW HB(OCT 30 7.3--> 7.6 NOV 3). STOOL GUAIC POSITIVE FROM 2 PM RESULTED TODAY. NO BRBPR OR MELENA. ON IRON ONCE DAILY.

## 2013-10-19 NOTE — Telephone Encounter (Signed)
A friend of the patient came to the front office window wanting SF to go see patient at [facility]. I told her that I didn't think the doctors here rounded at the [facility] and I would have to make OV for her to come here. The lady said that the patient had a knee replacement and couldn't get here. I told her that the staff at the [facility] would make arrangements to get her hear, but she said that no one there knew what was going on and patient said that she was going to die if she doesn't get some help and wants SF ONLY. I told the lady that I would send a note to [facility] and have DS come out and speak with her. Karma Greaser said that the patient was desperate and needed help and SF was the only one that has ever helped her.

## 2013-10-20 ENCOUNTER — Ambulatory Visit (INDEPENDENT_AMBULATORY_CARE_PROVIDER_SITE_OTHER): Payer: Medicare Other | Admitting: Gastroenterology

## 2013-10-20 ENCOUNTER — Encounter: Payer: Self-pay | Admitting: Gastroenterology

## 2013-10-20 VITALS — BP 127/70 | HR 89 | Temp 98.0°F | Wt 157.4 lb

## 2013-10-20 DIAGNOSIS — D649 Anemia, unspecified: Secondary | ICD-10-CM | POA: Insufficient documentation

## 2013-10-20 DIAGNOSIS — R110 Nausea: Secondary | ICD-10-CM | POA: Insufficient documentation

## 2013-10-20 DIAGNOSIS — R7989 Other specified abnormal findings of blood chemistry: Secondary | ICD-10-CM | POA: Insufficient documentation

## 2013-10-20 DIAGNOSIS — R195 Other fecal abnormalities: Secondary | ICD-10-CM | POA: Insufficient documentation

## 2013-10-20 DIAGNOSIS — R945 Abnormal results of liver function studies: Secondary | ICD-10-CM | POA: Insufficient documentation

## 2013-10-20 NOTE — Patient Instructions (Signed)
1. Labs on 10/21/13 as planned. 2. Abdominal ultrasound as planned. 3. Review records from Dr. Kinnie Scales. 4. Please consume a bland diet. No fried or greasy foods. Try crackers/toast/baked chicken/mashed potatoes. Avoid dairy for now.

## 2013-10-20 NOTE — Telephone Encounter (Signed)
Pt has appt with Tana Coast, PA today at 11:00 AM.

## 2013-10-20 NOTE — Progress Notes (Signed)
Primary Care Physician: Cassell Smiles., MD  Primary Gastroenterologist:  Jonette Eva, MD   Chief Complaint  Patient presents with  . Constipation  . Nausea    HPI: Jo Parrish is a 79 y.o. female here for urgent OV for h/o nausea, abdominal discomfort, constipation, anemia/heme+ stool.  Patient well known to our practice from previous evaluation. Last seen back in 2010. History of chronic nausea, abdominal complaints though to be due to functional gut disorder. Also has history of small bowel bacterial overgrowth documented on hydrogen breath test previously. Mild gastritis on EGD in June 2009.  Patient underwent right total knee arthroplasty on 10/09/2013. She states she was recovering well postoperatively however shortly after arriving at the [facility] for rehabilitation she started having abdominal distention, nausea with dry heaves. It was felt that she was constipated. She was given enema and suppositories. Patient states that her bowels are moving better but continues to have dry heaves and anorexia. She has not been out to eat for about a week. Living on crackers or bread. Currently having 1 watery bowel movement daily if this does not take MiraLax prescribed. She's been 2 days without it at this point. Denies any melena or rectal bleeding. Denies any heartburn. She was Hemoccult positive on digital rectal exam yesterday. At time of her discharge on 10/12/2013 her hemoglobin was 8.1. Hemoglobin was 13.4 preoperatively on 10/02/2013. Yesterday her hemoglobin was 7.8.  Patient states she was having issues with her stomach again for the past several months. She went to see Dr. Kinnie Scales a couple months ago and underwent EGD and colonoscopy. We requested copies of records and had received at time of this dictation. Colonoscopy on 05/28/2013 showed multiple colon polyps, internal hemorrhoids. Advised to come back in 3 years. EGD same date showed grade B. erosive esophagitis, large hiatal  hernia. He increased her omeprazole to 40 mg daily. Colon biopsies showed several tubular adenomas. Dry heaves. Worse with sitting and standing. Slight dizziness.    Current Outpatient Prescriptions on File Prior to Visit  Medication Sig Dispense Refill  . acetaminophen (TYLENOL) 325 MG tablet Take 2 tablets (650 mg total) by mouth every 6 (six) hours as needed.  60 tablet  0  . amLODipine (NORVASC) 5 MG tablet Take 5 mg by mouth at bedtime.       Marland Kitchen atenolol (TENORMIN) 25 MG tablet Take 25 mg by mouth every morning.       . ezetimibe (ZETIA) 10 MG tablet Take 10 mg by mouth at bedtime.      . irbesartan (AVAPRO) 300 MG tablet Take 300 mg by mouth every morning.       Marland Kitchen levothyroxine (SYNTHROID, LEVOTHROID) 112 MCG tablet Take 112 mcg by mouth daily before breakfast.      . methocarbamol (ROBAXIN) 500 MG tablet Take 1 tablet (500 mg total) by mouth every 6 (six) hours as needed.  80 tablet  0  . metoCLOPramide (REGLAN) 5 MG tablet Take 1-2 tablets (5-10 mg total) by mouth every 8 (eight) hours as needed (if ondansetron (ZOFRAN) ineffective.).  40 tablet  0  . omeprazole (PRILOSEC) 40 MG capsule Take 40 mg by mouth every morning.       . ondansetron (ZOFRAN) 4 MG tablet Take 1 tablet (4 mg total) by mouth every 6 (six) hours as needed for nausea.  40 tablet  0  . oxyCODONE (OXY IR/ROXICODONE) 5 MG immediate release tablet Take one or two tablets by mouth every 3 hours as  needed for pain  360 tablet  0  . polyethylene glycol (MIRALAX / GLYCOLAX) packet Take 17 g by mouth daily as needed.  14 each  0  . rivaroxaban (XARELTO) 10 MG TABS tablet Take 1 tablet (10 mg total) by mouth daily with breakfast. Take Xarelto for two and a half more weeks, then discontinue Xarelto. Once the patient has completed the blood thinner regimen, then take a Baby 81 mg Aspirin daily for four more weeks.  18 tablet  0  . traMADol (ULTRAM) 50 MG tablet Take one to two tablets by mouth every 6 hours as needed for mild pain   240 tablet  5  Colace 100 mg twice a day Doxycycline 100 mg twice a day Dulcolax 10 mg suppositories when necessary Ferrous sulfate 325 mg daily      Allergies as of 10/20/2013 - Review Complete 10/20/2013  Allergen Reaction Noted  . Statins Other (See Comments) 09/30/2013  . Sulfa antibiotics Other (See Comments) 09/30/2013   Past Medical History  Diagnosis Date  . Hyperlipidemia   . Hypertension   . Bacterial overgrowth syndrome     Small bowel  . Sjogren's syndrome   . Rheumatoid arthritis(714.0)   . Hypothyroidism   . Complication of anesthesia   . PONV (postoperative nausea and vomiting)   . GERD (gastroesophageal reflux disease)   . Ulcer, esophagus   . Constipation   . OSA (obstructive sleep apnea)     UNABLE TO TOLERATE  MASK   Past Surgical History  Procedure Laterality Date  . Appendectomy    . Esophagogastroduodenoscopy  06/07/2008     Gastritis. No celiac sprue/Small hiatal hernia  . Hydrogen breath test   05/17/2008    small bowel bacterial overgrowth/Hydrogen level rise consistent with small bowel bacterial  . Tonsillectomy    . Dilation and curettage of uterus    . Total knee arthroplasty Right 10/09/2013    Procedure: RIGHT TOTAL KNEE ARTHROPLASTY;  Surgeon: Loanne Drilling, MD;  Location: [facility];  Service: Orthopedics;  Laterality: Right;     ROS:  General: Positive for anorexia. Negative for weight loss, fever, chills, fatigue, weakness. ENT: Negative for hoarseness, difficulty swallowing , nasal congestion. CV: Negative for chest pain, angina, palpitations, dyspnea on exertion, peripheral edema.  Respiratory: Negative for dyspnea at rest, dyspnea on exertion, cough, sputum, wheezing.  GI: See history of present illness. GU:  Negative for dysuria, hematuria, urinary incontinence, urinary frequency, nocturnal urination.  Endo: Negative for unusual weight change.    Physical Examination:   BP 115/54  Pulse 79  Temp(Src) 98 F (36.7 C) (Oral)   Wt 157 lb 6.4 oz (71.396 kg)  General: Well-nourished, well-developed in no acute distress.  Eyes: No icterus. Mouth: Oropharyngeal mucosa moist and pink , no lesions erythema or exudate. Lungs: Clear to auscultation bilaterally.  Heart: Regular rate and rhythm, no murmurs rubs or gallops.  Abdomen: Bowel sounds are normal, mild epigastric discomfort, nondistended, no hepatosplenomegaly or masses, no abdominal bruits or hernia , no rebound or guarding.   Extremities: No lower extremity edema. No clubbing or deformities. Neuro: Alert and oriented x 4   Skin: Warm and dry, no jaundice.   Psych: Alert and cooperative, normal mood and affect.  Labs:   labs from 10/19/2013 White blood cell count 7800, hemoglobin 7.8, hematocrit 23, MCV 98.7, platelets 305,000, creatinine 0.67, BUN 10. 10/15/2013 hemoglobin 7.3, hematocrit 21.7, total bilirubin 0.8, alkaline phosphatase 54, AST 49, ALT 76, albumin 2.4.  Imaging Studies: Dg Chest 2 View  10/02/2013   CLINICAL DATA:  Preop. History of hypertension.  EXAM: CHEST  2 VIEW  COMPARISON:  07/03/2007  FINDINGS: Cardiac silhouette is normal in size and configuration. The aorta is uncoiled. No mediastinal or hilar masses.  The lungs are clear. No pleural effusion or pneumothorax.  The bony thorax is demineralized but intact.  IMPRESSION: No active cardiopulmonary disease.   Electronically Signed   By: Amie Portland M.D.   On: 10/02/2013 12:55   US Venous Img Lower Unilateral Right  10/16/2013   CLINICAL DATA:  Right lower extremity pain for 1 week, status post right total knee arthroplasty 1 week ago  EXAM: RIGHT LOWER EXTREMITY VENOUS ULTRASOUND  TECHNIQUE: Gray-scale sonography with graded compression, as well as color Doppler and duplex ultrasound, were performed to evaluate the deep venous system from the level of the common femoral vein through the popliteal and proximal calf veins. Spectral Doppler was utilized to evaluate flow at rest and with  distal augmentation maneuvers.  COMPARISON:  None.  FINDINGS: Thrombus within deep veins:  None visualized.  Compressibility of deep veins:  Normal.  Duplex waveform respiratory phasicity:  Normal.  Duplex waveform response to augmentation:  Normal.  Venous reflux:  None visualized.  Other findings:  None visualized.  IMPRESSION: No evidence for right lower extremity deep venous thrombosis.   Electronically Signed   By: Christiana Pellant M.D.   On: 10/16/2013 14:04   Dg Knee Complete 4 Views Right  10/16/2013   CLINICAL DATA:  Knee pain. Right knee replacement.  EXAM: RIGHT KNEE - COMPLETE 4+ VIEW  COMPARISON:  None.  FINDINGS: Changes of right knee replacement. No hardware or bony complicating feature. No fracture, subluxation or dislocation. Suspect small joint effusion.  IMPRESSION: Right knee replacement. No hardware complicating feature. Suspect small joint effusion.   Electronically Signed   By: Charlett Nose M.D.   On: 10/16/2013 13:40   Dg Abd 2 Views  10/19/2013   CLINICAL DATA:  Nausea, abdominal distension, history hypertension, hyperlipidemia, GERD, rheumatoid arthritis, Sjogren's syndrome  EXAM: ABDOMEN - 2 VIEW  COMPARISON:  Abdominal radiograph 09/22/2007  FINDINGS: Normal bowel gas pattern.  No bowel dilatation or bowel wall thickening.  No free intraperitoneal air on upright view.  Lung bases clear.  Bones demineralized.  No urinary tract calcification.  Left pelvic phleboliths stable.  IMPRESSION: No acute abnormalities.   Electronically Signed   By: Ulyses Southward M.D.   On: 10/19/2013 14:49

## 2013-10-20 NOTE — Assessment & Plan Note (Addendum)
79 year old lady with acute on chronic nausea, anorexia, unable to eat. Experiencing dry heaves without emesis. Symptoms present more postoperatively from recent right knee arthroplasty. Tells me she was having some issues a couple months ago and saw Dr. Kinnie Scales because of that. As outlined above she did have some erosive esophagitis and a large hiatal hernia and PPI was increased to omeprazole 40 mg daily. Postoperatively she developed some constipation and nausea likely due to an ileus. Abdominal film yesterday was unremarkable. She is now moving her bowels but stool is loose possibly related to laxatives and/or antibiotics. Need to be on lookout for C. difficile colitis given current scenario. Notable drop in hemoglobin since her discharge without overt GI bleeding. She had a large ecchymosis on the right leg which could explain some of her drop in hemoglobin postoperatively. She was Hemoccult positive on rectal exam today. Also with new AST/ALT elevation, likely unrelated to her ongoing symptoms. Gallbladder does remain in situ however.  Repeat CBC, CMET tomorrow. Abd U/S for nausea, abnormal LFTs. Consider blood transfusion, one unit based on labs tomorrow. Patient on Xarelto for DVT prophylaxis, monitor closely for overt bleeding. Hopefully she will be able to complete therapy (10 more days) without need to stop early. Continue PPI.  Further recommendations to follow.

## 2013-10-22 ENCOUNTER — Non-Acute Institutional Stay (SKILLED_NURSING_FACILITY): Payer: Medicare Other | Admitting: Internal Medicine

## 2013-10-22 DIAGNOSIS — K5900 Constipation, unspecified: Secondary | ICD-10-CM

## 2013-10-22 DIAGNOSIS — R109 Unspecified abdominal pain: Secondary | ICD-10-CM

## 2013-10-22 DIAGNOSIS — E039 Hypothyroidism, unspecified: Secondary | ICD-10-CM

## 2013-10-22 DIAGNOSIS — E785 Hyperlipidemia, unspecified: Secondary | ICD-10-CM

## 2013-10-22 DIAGNOSIS — Z96651 Presence of right artificial knee joint: Secondary | ICD-10-CM

## 2013-10-22 DIAGNOSIS — I10 Essential (primary) hypertension: Secondary | ICD-10-CM

## 2013-10-22 DIAGNOSIS — D649 Anemia, unspecified: Secondary | ICD-10-CM

## 2013-10-22 DIAGNOSIS — Z96659 Presence of unspecified artificial knee joint: Secondary | ICD-10-CM

## 2013-10-22 NOTE — Progress Notes (Signed)
Patient ID: Jo Parrish, female   DOB: 05-Jun-1926, 79 y.o.   MRN: [id number]  This is a discharge note.  Level of care skilled.  Facility [facility].  Chief complaint-discharge no.  History of present illness.  Patient is a very pleasant 78 year old female here for rehabilitation after undergoing a right knee replacement secondary to severe end-stage osteoarthritis--  Her postop course in regards to this has been fairly uncomplicated-she was started on doxycycline for some concerns of a mild cellulitis but this appears to be resolved she did see her orthopedic doctor today and has been cleared for discharge.  The main issue here has been more GI related she did initially have complaints of constipation which appeared to be doing better.  However she continued to complain of some nausea abdominal discomfort and poor appetite.  I did review prior notes and she has been seen by GI before for a history of stomach issues.  A GI consult was ordered earlier this week and there was expedient followup she actually saw them earlier this week and they have ordered an abdominal ultrasound for November 11.  Also orders that she may need transfusion if her hemoglobin did not rise however the hemoglobin yesterday was 8.3 which is actually an improvement from 7.3 earlier in her stay She does continue on iron-apparently there is some history of occult positive stool here.  In regards to her nausea she is receiving Zofran at times with relief she is only asked for it once today.  Her pain appears to be controlled with tramadol when necessary again she's doing well with her knee it appears.--She has been receiving oxycodone at times but this will be stopped secondary to her history of nausea  Her other issues including hypertension appear to be under decent control she is on atenolol as well as Avapro and Norvasc recent blood pressures have been 130/53-123/71 in this range.  She also has a history of  hypothyroidism she is on Synthroid we'll defer aggressive followup of this to her primary care provider.  Tonight she has no acute complaints she is complaining of a little nausea and poor appetite but denies any chest pain shortness of breath also is having some mild knee pain and she  requesting a tramadol  Patient does live alone she will have help will need a rolling walker as well as home health support PT and OT.  She is going to continue on Xeralto until November 15.  Family medical social history is reviewed per admit note on 10/14/2013.  Medications have been reviewed per MAR.  Review of systems.  In general she denies any fever or chills appears to be feeling a bit better than when I saw her last week.  Skin-does not complaining of any rashes or itching.  Head ears eyes nose mouth and throat-does not complaining of any visual changes or sore throat or nasal discharge.  Respiratory no complaints of shortness of breath or cough.  Cardiac-does not complaining of chest pain does have some listed history of chronic diastolic CHF but no evidence of that today.  GI-issues as noted above she is complaining of a little nausea this evening constipation apparently is significantly better.  Muscle skeletal-does complain at times of some left knee pain this is relieved with tramadol-she has been cleared for discharge by her orthopedic doctor.  GU-had been concerns apparently about urinary retention earlier in the stay but this appears to have resolved  Neurologic does not complaining of any numbness dizziness  or headache.  Psych-does not appear to be depressed or anxious.  Physical exam.  Temperature is 97.8 pulse 80 respirations 20 blood pressure 130/53 weight is 155.5 this is a slight decline from admission weight.  In general this is a very pleasant elderly female in no distress sitting comfortably on the side of her bed.  Her skin is warm and dry surgical site right knee  Steri-Strips are in place there is a well-healing surgical scar with crusting there is some mild erythema but this certainly appears improved fromlast week  Eyes-pupils appear equal round reactive to light.  Chest is clear to auscultation without any rhonchi rales or wheezes.    Heart is regular rate and rhythm without murmur gallop or around I do not really see significant lower extremity edema pedal pulses are intact bilaterally she does have TED hose on right lower leg.  Abdomen is soft -- slightly tender to palpation there are active bowel sounds  Muscle skeletal does move all extremities x4 surgical site appears to be fairly benign there is no lower extremity edema other than some mild edema around the knee--she is ambulating with a rolling walker.  Neurologic-grossly intact no lateralizing findings speech is clear.  Psych she is alert and oriented x3 pleasant engaged -- looking forward to going home  Labs.  10/21/2013.  WBC 7.3 hemoglobin 8.3 platelets 344.  Sodium 134 potassium 4.2 BUN 13 creatinine 0.7 to-liver function tests within normal limits except albumin of 3.0.  Assessment and plan.  #1-history of right knee replacement secondary to severe osteoarthritis-she appears to be doing well in this regard- he is receiving tramadol for painialso on Xeralto  #2-GI issues-with history of anemia occult positive blood and history of postop ileus-as well as nausea abdominal discomfort-this is being followed by GI she will have an ultrasound done on November 11-her hemoglobin actually has risen some she is on iron as well as a proton pump inhibitor-she appears to be clinically  more comfortable tonight then she was last week  #3-history of hypertension-this appears to be stable on her current medications.  #4-hyperlipidemia-she is onZetia--per review in Epic I do not see a recent lipid panel secondary to her short stay here will defer to primary care provider  #5-history of  diastolic CHF-do not see any evidence of that during her stay here.  #6 past history of hypothyroidism-she is on Synthroid-will defer followup to primary care provider.  #7-constipation-this appears to have stabilized she is on MiraLax Dulcolax when necessary  On discharge she will need PTOT home health she does have help at home -- also will need a rolling walker-also will discontinue her doxycycline   [id number]--greater than 30 minutes spent preparing this discharge summary

## 2013-10-23 ENCOUNTER — Telehealth: Payer: Self-pay | Admitting: Gastroenterology

## 2013-10-23 NOTE — Telephone Encounter (Signed)
Did nursing home do her labs I ordered at time of my OV?

## 2013-10-26 ENCOUNTER — Telehealth: Payer: Self-pay

## 2013-10-26 ENCOUNTER — Ambulatory Visit (HOSPITAL_COMMUNITY)
Admit: 2013-10-26 | Discharge: 2013-10-26 | Disposition: A | Discharge status: Home / Self Care | Payer: Medicare Other | Attending: Gastroenterology | Admitting: Gastroenterology

## 2013-10-26 DIAGNOSIS — R110 Nausea: Secondary | ICD-10-CM

## 2013-10-26 DIAGNOSIS — R195 Other fecal abnormalities: Secondary | ICD-10-CM

## 2013-10-26 DIAGNOSIS — R945 Abnormal results of liver function studies: Secondary | ICD-10-CM

## 2013-10-26 DIAGNOSIS — R7989 Other specified abnormal findings of blood chemistry: Secondary | ICD-10-CM

## 2013-10-26 DIAGNOSIS — R112 Nausea with vomiting, unspecified: Secondary | ICD-10-CM | POA: Insufficient documentation

## 2013-10-26 DIAGNOSIS — D649 Anemia, unspecified: Secondary | ICD-10-CM

## 2013-10-26 LAB — CBC
BUN: 10 mg/dL (ref 4–21)
Creat: 0.67
HCT: 23 %
MCV: 98.7 fL
platelet count: 198

## 2013-10-26 LAB — COMPREHENSIVE METABOLIC PANEL
Creat: 0.78
Glucose: 127
Total Bilirubin: 0.8 mg/dL

## 2013-10-26 NOTE — Telephone Encounter (Signed)
I called the [facility], spoke to [place]. Pt was discharged on 10/23/2013. Judeth Cornfield will let pt's nurse know so she can fax [place] the lab results.

## 2013-10-26 NOTE — Telephone Encounter (Signed)
Pt is wanting to know the result of her [place]. Please advise

## 2013-10-26 NOTE — Progress Notes (Signed)
cc'd to pcp 

## 2013-10-28 ENCOUNTER — Other Ambulatory Visit: Payer: Self-pay

## 2013-10-28 DIAGNOSIS — D649 Anemia, unspecified: Secondary | ICD-10-CM

## 2013-10-28 NOTE — Progress Notes (Signed)
Quick Note:  abd u/s is normal. Labs dated 10/21/13: H/H somewhat improved at 8.3/25. LFTs now normal.   Let's repeat her CBC in one month. Continue iron as before. How is she doing? ______

## 2013-10-28 NOTE — Telephone Encounter (Signed)
See result note.  

## 2013-10-28 NOTE — Progress Notes (Signed)
Quick Note:  I called pt and informed of results. She is not taking iron, said she was never started on any. She is still having nausea, and completed her Zofran, but she called [facility] and asked Lelon Mast to give her a refill on that. She is having some abdominal pain and having problems with constipation. Said she had to use soft gel stool softeners to have a decent BM yesterday. She said she will do anything [facility] wants her to do, or she will come in if needed. Lab order mailed to pt to do in one month. ______

## 2013-10-28 NOTE — Progress Notes (Signed)
Patient ID: Jo Parrish, female   DOB: 1926/03/20, 79 y.o.   MRN: [id number]           PROGRESS NOTE  DATE:  10/19/2013  FACILITY: [facility]   LEVEL OF CARE:   SNF   Acute Visit   CHIEF COMPLAINT:  Continued problems with nausea.    HISTORY OF PRESENT ILLNESS:  This is an 79 year-old woman whom I admitted to the facility last week, who came to [place] after an elective right total knee replacement.    She had postoperative nausea and vomiting and was unable to eat and had not had a bowel movement in several days when she arrived here.  When I saw her, her abdomen was moderately distended.  I thought she had a postoperative ileus.  I gave her MiraLAX and Dulcolax suppositories.  She  apparently has had bowel movements but continues to complain of nausea, not being able to eat.  She is not having diarrhea.    Other issues have included some erythema on the lateral aspect of her right knee and she was put on doxycycline.  A duplex ultrasound of the right leg is negative.    REVIEW OF SYSTEMS:   CHEST/RESPIRATORY:  She is not having shortness of breath.   CARDIAC:   No chest pain.   GI:  No clear swallowing difficulties.  The patient does not have any appetite.  She is having bowel movements.  No diarrhea and no clear abdominal pain.    LABORATORY DATA/RADIOLOGY:   She states that she has had a recent EGD and colonoscopy by Dr. Ritta Slot, although I do not have these on [facility].    She did have an endoscopy in 2009 with gastritis, no celiac disease, and a small hiatal hernia.    The patient is requesting to see Dr. Darrick Penna.    PHYSICAL EXAMINATION:   GENERAL APPEARANCE:  The patient does not look in any distress.  Very anxious.   CHEST/RESPIRATORY:  Clear air entry bilaterally.   CARDIOVASCULAR:  CARDIAC:   Heart sounds are normal.  She appears to be euvolemic.   GASTROINTESTINAL:  ABDOMEN:   Much less distended than last week.  Bowel sounds are active.  She does not  have any tenderness.  She has a right lower quadrant appendectomy scar.   GENITOURINARY:  BLADDER:   No suprapubic or costovertebral angle tenderness.    ASSESSMENT/PLAN:  Postoperative ileus.  I think this is a lot better than when I saw her several days ago.  However, the patient is still complaining and wants to see Dr. Darrick Penna of GI.  I did a rectal exam on her last week.  She was guaiac negative.    Question cellulitis on the lateral aspect of the right knee.  There has obviously been extensive bleeding in upper and lower parts of her leg.  Her hemoglobin has largely stabilized at 7.8.  It was 8.1 when she left the hospital.    I do not see an urgent issue here.  I will do a plain x-ray of her abdomen.  The patient wants to see Dr. Darrick Penna.  I am generally fine with that.  We will follow up on her hemoglobin.  She has a history of gastritis and  apparently had both an endo and a colon done this year.  I will see if we can get these records from Dr. Jennye Boroughs office.    CPT CODE: 11914

## 2013-10-29 LAB — CBC
HCT: 25 %
MCV: 100 fL
platelet count: 344

## 2013-10-29 LAB — COMPREHENSIVE METABOLIC PANEL
Albumin: 3
Creat: 0.72
Total Bilirubin: 0.9 mg/dL

## 2013-10-30 ENCOUNTER — Other Ambulatory Visit: Payer: Self-pay

## 2013-10-30 NOTE — Progress Notes (Signed)
Quick Note:  Called and informed pt. Routing to SS to schedule OV with SF ASAP. ______

## 2013-10-30 NOTE — Progress Notes (Signed)
Quick Note:  Patient should take ferrous sulfate 325mg  daily. OTC. Increase Miralax to 17 grams twice per day. Call if no improvement in constipation. Limit Zofran as it will promote constipation.  OV with [facility] ASAP. ______

## 2013-11-03 ENCOUNTER — Telehealth: Payer: Self-pay | Admitting: Gastroenterology

## 2013-11-03 NOTE — Telephone Encounter (Signed)
Routing to Dr. Fields.  

## 2013-11-03 NOTE — Telephone Encounter (Signed)
I called patient to offer her a cancellation that I had with SF for in the morning at 9am, Pt needed OV with SF ASAP. I told patient that SF has been out of town and I was waiting to see when I would be able to make OV ASAP and was offering her the cancellation that I had for tomorrow. Pt said that she has followed up with Dr Kinnie Scales and had seen him today. She said since no one has called her in over a week and her CT was negative she didn't think we needed to see her. I told her that I was waiting for a reply about how to get in seen by [facility] but was waiting for SF to get back. Patient understood but said that Dr Kinnie Scales was treating her.

## 2013-11-04 ENCOUNTER — Ambulatory Visit: Payer: Medicare Other | Admitting: Gastroenterology

## 2013-11-04 NOTE — Telephone Encounter (Signed)
PLEASE CALL PT. LET HER KNOW IT IS IN HER BEST INTEREST NOT TO HAVE TWO GI DOCTORS MANAGING HER CARE. SHE SHOULD CONTINUE TO SEE DR. MEDOFF. WE WILL MAKE A NOTE IN THE SYSTEM.

## 2013-11-04 NOTE — Telephone Encounter (Signed)
I called and spoke to pt. She is aware Dr. Darrick Penna will not be responsible for her GI care.

## 2013-11-04 NOTE — Progress Notes (Signed)
REVIEWED.  

## 2013-11-04 NOTE — Telephone Encounter (Signed)
Routing to [place] to make note.

## 2013-11-04 NOTE — Telephone Encounter (Signed)
Noted  

## 2013-11-04 NOTE — Telephone Encounter (Signed)
REVIEWED.  

## 2014-01-04 ENCOUNTER — Ambulatory Visit: Payer: Medicare Other | Admitting: Physician Assistant

## 2014-01-07 ENCOUNTER — Other Ambulatory Visit: Payer: Self-pay | Admitting: *Deleted

## 2014-01-07 ENCOUNTER — Encounter: Payer: Self-pay | Admitting: Physician Assistant

## 2014-01-07 ENCOUNTER — Ambulatory Visit (INDEPENDENT_AMBULATORY_CARE_PROVIDER_SITE_OTHER): Payer: Medicare Other | Admitting: Physician Assistant

## 2014-01-07 VITALS — BP 122/78 | HR 76 | Ht 62.0 in | Wt 146.0 lb

## 2014-01-07 DIAGNOSIS — I10 Essential (primary) hypertension: Secondary | ICD-10-CM

## 2014-01-07 DIAGNOSIS — R002 Palpitations: Secondary | ICD-10-CM

## 2014-01-07 DIAGNOSIS — E785 Hyperlipidemia, unspecified: Secondary | ICD-10-CM

## 2014-01-07 DIAGNOSIS — K5900 Constipation, unspecified: Secondary | ICD-10-CM

## 2014-01-07 DIAGNOSIS — I5032 Chronic diastolic (congestive) heart failure: Secondary | ICD-10-CM

## 2014-01-07 NOTE — Assessment & Plan Note (Signed)
When she started the fenofibrate, she did so at the same time her as another drug and she broke out with vagina itching.  She will try it again without the second drug.

## 2014-01-07 NOTE — Patient Instructions (Addendum)
1.  Follow up with [name] in six months.  2.  Get flax seed and chia seed to help with constipation.  Fax seed:  Sprinkle 1-2 table spoons into your food.  Chia seed:  Sprinkle one teaspoon into food daily.  You can also put into your drinks.

## 2014-01-07 NOTE — Assessment & Plan Note (Signed)
I have recommend she start taking fax seed and chia seed daily.  She can mix them into her yogurt or other foods.  They are both high in omega-3 FA which will also benefit her cholesterol.

## 2014-01-07 NOTE — Assessment & Plan Note (Signed)
Well compensated 

## 2014-01-07 NOTE — Progress Notes (Signed)
Date:  01/07/2014   ID:  Jo Parrish, DOB 04/10/1926, MRN [id number]  PCP:  Cassell Smiles., MD  Primary Cardiologist:  Allyson Sabal     History of Present Illness: Jo Parrish is a 80 y.o. female mildly overweight, very active, widowed, Caucasian, mother of 1 child who I last saw 08/06/13. She has a history of hypertension, hyperlipidemia, hypothyroidism, left bundle branch block and lupus. She had a negative Myoview April 2012. Her most recent 2-D echocardiogram was January 2014 showed an ejection fraction of 45%-50% grade 1 diastolic dysfunction. She apparently needs a total knee replacement by Dr. Thurston Hole and has seen Dr. Lequita Halt for a second opinion. She doesn't have this on October 24. She was seen in our office on December 26, 2012, because of lower extremity edema and was placed on diuretics for several days which resulted in complete resolution of this. It also appears that she is statin intolerant.  The last time I saw her in October she was complaining of increased heart rate with the plan to but on a 30 monitor if it happened again.  She presents today for six month evaluation.  She has lost ten pounds since her last visit which she says is due to being a SNF after her right total knee.  She couldn't eat and the food was terrible.  Her HGB after surgery was 8.1 but labs from 12/25/13 report it at 13.2.  Her main complaint today is constipation.  She was prescribed a new medicine which [employer] will not cover.  She is also on miralax and  Dulcolax.  She also reports some fatigue but I think this improved as her hgb rebounded.  She is not taking the iron because of the constipation.    The patient currently denies nausea, vomiting, fever, chest pain, shortness of breath, orthopnea, dizziness, PND, cough, congestion, abdominal pain, hematochezia, melena, lower extremity edema, claudication.  Wt Readings from Last 3 Encounters:  01/07/14 146 lb (66.225 kg)  10/20/13 157 lb 6.4 oz (71.396 kg)    10/09/13 156 lb (70.761 kg)     Past Medical History  Diagnosis Date  . Hyperlipidemia   . Hypertension   . Bacterial overgrowth syndrome     Small bowel  . Sjogren's syndrome   . Rheumatoid arthritis(714.0)   . Hypothyroidism   . Complication of anesthesia   . PONV (postoperative nausea and vomiting)   . GERD (gastroesophageal reflux disease)   . Ulcer, esophagus   . Constipation   . OSA (obstructive sleep apnea)     UNABLE TO TOLERATE  MASK    Current Outpatient Prescriptions  Medication Sig Dispense Refill  . acetaminophen (TYLENOL) 325 MG tablet Take 2 tablets (650 mg total) by mouth every 6 (six) hours as needed.  60 tablet  0  . amLODipine (NORVASC) 5 MG tablet Take 5 mg by mouth at bedtime.       Marland Kitchen atenolol (TENORMIN) 25 MG tablet Take 25 mg by mouth every morning.       . bisacodyl (DULCOLAX) 10 MG suppository Place 10 mg rectally daily as needed for moderate constipation.      . diazepam (VALIUM) 10 MG tablet       . docusate sodium (COLACE) 100 MG capsule Take 100 mg by mouth 2 (two) times daily.      Marland Kitchen ezetimibe (ZETIA) 10 MG tablet Take 10 mg by mouth at bedtime.      . irbesartan (AVAPRO) 300 MG tablet  Take 300 mg by mouth every morning.       Marland Kitchen levothyroxine (SYNTHROID, LEVOTHROID) 112 MCG tablet Take 112 mcg by mouth daily before breakfast.      . omeprazole (PRILOSEC) 40 MG capsule Take 40 mg by mouth every morning.       . polyethylene glycol (MIRALAX / GLYCOLAX) packet Take 17 g by mouth daily as needed.  14 each  0  . ferrous sulfate 325 (65 FE) MG tablet Take 325 mg by mouth daily with breakfast.      . methocarbamol (ROBAXIN) 500 MG tablet Take 1 tablet (500 mg total) by mouth every 6 (six) hours as needed.  80 tablet  0  . metoCLOPramide (REGLAN) 5 MG tablet Take 1-2 tablets (5-10 mg total) by mouth every 8 (eight) hours as needed (if ondansetron (ZOFRAN) ineffective.).  40 tablet  0  . ondansetron (ZOFRAN) 4 MG tablet Take 1 tablet (4 mg total) by mouth  every 6 (six) hours as needed for nausea.  40 tablet  0  . oxyCODONE (OXY IR/ROXICODONE) 5 MG immediate release tablet Take one or two tablets by mouth every 3 hours as needed for pain  360 tablet  0  . rivaroxaban (XARELTO) 10 MG TABS tablet Take 1 tablet (10 mg total) by mouth daily with breakfast. Take Xarelto for two and a half more weeks, then discontinue Xarelto. Once the patient has completed the blood thinner regimen, then take a Baby 81 mg Aspirin daily for four more weeks.  18 tablet  0  . traMADol (ULTRAM) 50 MG tablet Take one to two tablets by mouth every 6 hours as needed for mild pain  240 tablet  5   No current facility-administered medications for this visit.    Allergies:    Allergies  Allergen Reactions  . Statins Other (See Comments)    Leg problems  . Sulfa Antibiotics Other (See Comments)    Reaction unknown    Social History:  The patient  reports that she has never smoked. She does not have any smokeless tobacco history on file. She reports that she does not drink alcohol or use illicit drugs.   Family history:  No family history on file.  ROS:  Please see the history of present illness.  All other systems reviewed and negative.   PHYSICAL EXAM: VS:  BP 122/78  Pulse 76  Ht 5\' 2"  (1.575 m)  Wt 146 lb (66.225 kg)  BMI 26.70 kg/m2 Well nourished, well developed, in no acute distress HEENT: Pupils are equal round react to light accommodation extraocular movements are intact.  Neck: no JVDNo cervical lymphadenopathy. Cardiac: Regular rate and rhythm without murmurs rubs or gallops. Lungs:  clear to auscultation bilaterally, no wheezing, rhonchi or rales Abd: soft, nontender, positive bowel sounds all quadrants, no hepatosplenomegaly Ext: no lower extremity edema.  2+ radial and dorsalis pedis pulses. Skin: warm and dry Neuro:  Grossly normal  EKG:   NSR 76 bpm  ASSESSMENT AND PLAN:  Problem List Items Addressed This Visit   Chronic diastolic heart  failure     Well compensated.    Essential hypertension     BP well controlled    Hyperlipidemia     When she started the fenofibrate, she did so at the same time her as another drug and she broke out with vagina itching.  She will try it again without the second drug.    Heart palpitations     Rarely has palpitations now.  Constipation     I have recommend she start taking fax seed and chia seed daily.  She can mix them into her yogurt or other foods.  They are both high in omega-3 FA which will also benefit her cholesterol.      Other Visit Diagnoses   Palpitations    -  Primary    Relevant Orders       EKG 12-Lead

## 2014-01-07 NOTE — Assessment & Plan Note (Signed)
Rarely has palpitations now.

## 2014-01-07 NOTE — Assessment & Plan Note (Signed)
BP well controlled.

## 2014-01-11 ENCOUNTER — Telehealth: Payer: Self-pay | Admitting: Physician Assistant

## 2014-01-11 NOTE — Telephone Encounter (Signed)
Hilbert Bible on Thursday and he suggested that she take organic flax seed and [place]. She found out that she had a reaction to them. Her primary doctor and GI doctor her to call and notify you of this.

## 2014-01-11 NOTE — Telephone Encounter (Signed)
Returned call.  Left message that message received and will be forwarded to [facility] to call back tomorrow before 4pm.  Message forwarded to B. Hager, PA-C.

## 2014-01-21 ENCOUNTER — Other Ambulatory Visit: Payer: Self-pay | Admitting: *Deleted

## 2014-01-21 MED ORDER — FENOFIBRATE 48 MG PO TABS: 48.0000 mg | ORAL_TABLET | Freq: Every day | ORAL | Status: DC

## 2014-02-03 ENCOUNTER — Telehealth: Payer: Self-pay | Admitting: Cardiology

## 2014-02-03 NOTE — Telephone Encounter (Signed)
Pt called requesting an office appointment for evaluation of her BP. I informed patient that due to it being after 5pm that I was unable to schedule the appointment at this time. She denied being in any acute distress and denied symptoms of chest pain/pressure/tightness, SOB, HA, dizziness, visual and speech changes. She has checked her BP at home several times in the last 2-3 days and has noted BPs in the 956O systolic. She recently checked her BP before calling and it was 163/78. She is on amlodipine 5 mg, avapro 300 mg and atenolol 25 mg. She may require titration of her BP meds for better BP control. Pt was instructed to call the office at 8 am tomorrow to schedule an appointment with Tarri Fuller, PA-C. She was instructed to seek emergency evaluation if she develops CP or SOB, in the setting of hypertension. She verbalized understanding.   Lyda Jester, PA-C

## 2014-02-04 ENCOUNTER — Ambulatory Visit (INDEPENDENT_AMBULATORY_CARE_PROVIDER_SITE_OTHER): Payer: Medicare Other | Admitting: Physician Assistant

## 2014-02-04 ENCOUNTER — Encounter: Payer: Self-pay | Admitting: Physician Assistant

## 2014-02-04 VITALS — BP 120/80 | HR 80 | Ht 62.0 in | Wt 143.0 lb

## 2014-02-04 DIAGNOSIS — I10 Essential (primary) hypertension: Secondary | ICD-10-CM

## 2014-02-04 MED ORDER — AMLODIPINE BESYLATE 10 MG PO TABS: 10.0000 mg | ORAL_TABLET | Freq: Every day | ORAL | Status: DC

## 2014-02-04 NOTE — Assessment & Plan Note (Signed)
Based on the patient's diary of blood pressure appears her blood pressure is elevated more in the evening. This morning here in the office her blood pressure is well controlled and was yesterday morning. Would increase her amlodipine however when asked to take it in the morning as opposed to at night. Hopefully this will level out her blood pressures.  I've asked her to check her blood pressure once a day but to change the time of day she checks it. I also asked her if she becomes dizzy or lightheaded to check her blood pressure immediately so we can identify and avoid hypotension.  I think part of the problem is she will work herself up regarding her blood pressure which then aggravates it.

## 2014-02-04 NOTE — Patient Instructions (Signed)
1.  Increase amlodipine to 10mg  daily and take in the morning.   2.  Take Bp once a day and change the time of day each day.   (Morning, lunch, evening). 3.  If better, See [name] as scheduled.

## 2014-02-04 NOTE — Progress Notes (Signed)
Date:  02/04/2014   ID:  Jo Parrish, DOB November 15, 1926, MRN [id number]  PCP:  Delphina Cahill, MD  Primary Cardiologist:  Gwenlyn Found    History of Present Illness: Jo Parrish is a 80 y.o. female mildly overweight, very active, widowed, Caucasian, mother of 1 child who I last saw 08/06/13. She has a history of hypertension, hyperlipidemia, hypothyroidism, left bundle branch block and lupus. She had a negative Myoview April 2012. Her most recent 2-D echocardiogram was January 2014 showed an ejection fraction of 17%-51% grade 1 diastolic dysfunction. She apparently needs a total knee replacement by Dr. Noemi Chapel and has seen Dr. Wynelle Link for a second opinion. She doesn't have this on October 24. She was seen in our office on December 26, 2012, because of lower extremity edema and was placed on diuretics for several days which resulted in complete resolution of this. It also appears that she is statin intolerant.   The last time I saw [name] she was complaining of constipation. She is trying to get authorization from [facility] to take any medication.  I have asked her add chia seed and flaxseed to her diet but apparently her stomach is extremely sensitive and she did not tolerate it.  She presents today with a for hypertension. Her blood pressure yesterday after shoveling snow it was 198/98 in the evening.  Blood pressure seemed to improve in the morning and then by evening it's elevated again last night was 191/84. She's been taking diazepam, which she does not take as much as it was prescribed, and it seems to be helping. She continues to complain of chronic fatigue and admits that she may be depressed to extend given her decreased activity levels.  The patient currently denies nausea, vomiting, fever, chest pain, shortness of breath, orthopnea, dizziness, PND, cough, congestion, abdominal pain, hematochezia, melena, lower extremity edema, claudication.  Wt Readings from Last 3 Encounters:    02/04/14 143 lb (64.864 kg)  01/07/14 146 lb (66.225 kg)  10/20/13 157 lb 6.4 oz (71.396 kg)     Past Medical History  Diagnosis Date  . Hyperlipidemia   . Hypertension   . Bacterial overgrowth syndrome     Small bowel  . Sjogren's syndrome   . Rheumatoid arthritis(714.0)   . Hypothyroidism   . Complication of anesthesia   . PONV (postoperative nausea and vomiting)   . GERD (gastroesophageal reflux disease)   . Ulcer, esophagus   . Constipation   . OSA (obstructive sleep apnea)     UNABLE TO TOLERATE  MASK    Current Outpatient Prescriptions  Medication Sig Dispense Refill  . acetaminophen (TYLENOL) 325 MG tablet Take 2 tablets (650 mg total) by mouth every 6 (six) hours as needed.  60 tablet  0  . amLODipine (NORVASC) 10 MG tablet Take 1 tablet (10 mg total) by mouth daily.  30 tablet  5  . atenolol (TENORMIN) 25 MG tablet Take 25 mg by mouth every morning.       . bisacodyl (DULCOLAX) 10 MG suppository Place 10 mg rectally daily as needed for moderate constipation.      . diazepam (VALIUM) 10 MG tablet       . docusate sodium (COLACE) 100 MG capsule Take 100 mg by mouth 2 (two) times daily.      Marland Kitchen ezetimibe (ZETIA) 10 MG tablet Take 10 mg by mouth at bedtime.      . fenofibrate (TRICOR) 48 MG tablet Take 1 tablet (  48 mg total) by mouth daily.  90 tablet  3  . irbesartan (AVAPRO) 300 MG tablet Take 300 mg by mouth every morning.       Marland Kitchen levothyroxine (SYNTHROID, LEVOTHROID) 112 MCG tablet Take 112 mcg by mouth daily before breakfast.      . omeprazole (PRILOSEC) 40 MG capsule Take 40 mg by mouth every morning.       . polyethylene glycol (MIRALAX / GLYCOLAX) packet Take 17 g by mouth daily as needed.  14 each  0  . traMADol (ULTRAM) 50 MG tablet Take one to two tablets by mouth every 6 hours as needed for mild pain  240 tablet  5  . ondansetron (ZOFRAN) 4 MG tablet Take 1 tablet (4 mg total) by mouth every 6 (six) hours as needed for nausea.  40 tablet  0   No current  facility-administered medications for this visit.    Allergies:    Allergies  Allergen Reactions  . Statins Other (See Comments)    Leg problems  . Sulfa Antibiotics Other (See Comments)    Reaction unknown    Social History:  The patient  reports that she has never smoked. She does not have any smokeless tobacco history on file. She reports that she does not drink alcohol or use illicit drugs.   Family history:  History reviewed. No pertinent family history.  ROS:  Please see the history of present illness.  All other systems reviewed and negative.   PHYSICAL EXAM: VS:  BP 120/80  Pulse 80  Ht 5\' 2"  (1.575 m)  Wt 143 lb (64.864 kg)  BMI 26.15 kg/m2 Well nourished, well developed, in no acute distress HEENT: Pupils are equal round react to light accommodation extraocular movements are intact.  Cardiac: Regular rate and rhythm without murmurs rubs or gallops. Lungs:  clear to auscultation bilaterally, no wheezing, rhonchi or rales Ext: no lower extremity edema.  2+ radial and dorsalis pedis pulses. Skin: warm and dry Neuro:  Grossly normal     ASSESSMENT AND PLAN:  Problem List Items Addressed This Visit   Essential hypertension - Primary     Based on the patient's diary of blood pressure appears her blood pressure is elevated more in the evening. This morning here in the office her blood pressure is well controlled and was yesterday morning. Would increase her amlodipine however when asked to take it in the morning as opposed to at night. Hopefully this will level out her blood pressures.  I've asked her to check her blood pressure once a day but to change the time of day she checks it. I also asked her if she becomes dizzy or lightheaded to check her blood pressure immediately so we can identify and avoid hypotension.  I think part of the problem is she will work herself up regarding her blood pressure which then aggravates it.    Relevant Medications      amLODIpine  (NORVASC) tablet

## 2014-02-05 ENCOUNTER — Telehealth: Payer: Self-pay | Admitting: *Deleted

## 2014-02-05 NOTE — Telephone Encounter (Signed)
Returned call and pt verified x 2.  Pt c/o lightheadedness, nausea and nervousness since noon.  Stated she took her medicine at 8 am as she was instructed.  Stated she checked her BP at [facility] and it was 141/68 HR 75.  Stated she started feeling the symptoms at the time she took her BP.  Pt checked BP while RN on the phone and BP 143/74 HR 91.  Pt also c/o feeling palpitations.  Pt wants Gaspar Bidding to be notified and let her know what to do b/c it's almost the weekend.  Message forwarded to B. Samara Snide, PA-C for further instructions.

## 2014-02-05 NOTE — Telephone Encounter (Signed)
Pt stated that she took the medication at 8:00 this morning as instructed. She is having some lightheadness, nervous, and nausea and doesn't know what to do. She stated that it is hard for her to stand. She saw [facility] yesterday.  Gaspar Bidding

## 2014-02-05 NOTE — Telephone Encounter (Signed)
Spoke w/ Tarri Fuller, PA-C and advised no change.  See PCP if no change.  Returned call and informed pt per instructions by PA.  Pt thinks she is taking too much medicine at one time.  Pt informed BP is not low and dose is okay.  Pt advised to go back to taking amlodipine in the evening instead of morning.  Advised no more meds today and start w/ AM meds tomorrow.  Take amlodipine tomorrow evening and see how she does over the weekend.  Call back on Monday if no improvement.  Pt verbalized understanding and agreed w/ plan.

## 2014-02-22 ENCOUNTER — Telehealth: Payer: Self-pay | Admitting: *Deleted

## 2014-02-22 NOTE — Telephone Encounter (Signed)
Returned call and pt verified x 2.  Pt stated Jo Parrish increased her BP medicine, amlodipine, last month.  Stated he told her it may not work when he increased it to 10 mg.  Pt stated she has been taking 1/2 tab twice a day (5 mg BID) and it has been working fine.  Pt asking for script to change from 10 mg daily to one 5 mg tab twice daily.  Stated she has been splitting them, but would rather take one tab twice daily b/c sometimes the pill is wasted when cutting in half.  Pt also stated script was sent to the mail order pharmacy for a 30-day supply instead of 90-day supply.  Informed that would be corrected.  Pt informed Jo Parrish (PA-C) is out of the office and it appears she is switching from [name] to [name].  Informed [name] will be notified for further instructions as RN cannot just change script b/c she prefers that.  Pt verbalized understanding and agreed w/ plan.  Pt understands RN will call her back.  Message forwarded to Jo Bears, RN to discuss w/ [name].  Please ask [name] if we can send in script for amlodipine 5 mg tabs: Take one tab twice daily.  Thanks. ~ [employer]

## 2014-02-22 NOTE — Telephone Encounter (Signed)
Pt stated that she has run out of her Amlodipine 10 mg 90 days. She is on 5 mg and Gaspar Bidding made the Rx change to 10 mg. She stated that the 10mg  pill did not work for her. She wants to continue to take it 5 mg bid. She wants it to be called into [facility].   Gaspar Bidding

## 2014-02-22 NOTE — Telephone Encounter (Signed)
Okay to proceed with 5mg  bid

## 2014-02-23 ENCOUNTER — Telehealth: Payer: Self-pay | Admitting: Cardiovascular Disease

## 2014-02-23 MED ORDER — AMLODIPINE BESYLATE 5 MG PO TABS: 5.0000 mg | ORAL_TABLET | Freq: Two times a day (BID) | ORAL | Status: DC

## 2014-02-23 NOTE — Telephone Encounter (Signed)
Rx sent to pharmacy.  Will call pt during normal operating hours.

## 2014-02-23 NOTE — Telephone Encounter (Signed)
Returned call.  Pt stated she found out what she needed to know.  Stated she called the pharmacy and they read off the way the prescription was written and it was well.  Pt thankful for script being sent.

## 2014-02-23 NOTE — Telephone Encounter (Signed)
Is that you give gicve her a call back please    Thanks

## 2014-02-23 NOTE — Telephone Encounter (Signed)
Returned call.  Left message on pt-identified voicemail that script okay per [name] and sent to pharmacy.  Also to call back before 4pm if questions.

## 2014-03-18 ENCOUNTER — Telehealth: Payer: Self-pay | Admitting: Cardiovascular Disease

## 2014-03-18 NOTE — Telephone Encounter (Signed)
Returned call and pt verified x 2.  Pt stated yesterday she was slightly light-headed.  Also stated she had trembles in her hands.  Also c/o heart palpitations yesterday "like I have never had them in my life."  Stated she took a diazepam and she was okay after taking a nap.  Stated when she got up, her BP was 148/89 HR 92.  Stated about an hour to hour and half later 128/69 HR 79.  Stated she is okay this morning, except she has a little trembling this morning.  Pt wanted to know if she needs to have any tests in the near future.   Advice  Contact PCP as symptoms sound like they are anxiety-related: relieved by diazepam  If PCP r/o anxiety, then follow PCP's recommendations for next steps  Pt verbalized understanding and agreed w/ plan.

## 2014-03-18 NOTE — Telephone Encounter (Signed)
Jo Parrish states that yesterday she was light headed and had the trembles and by 3 -4 the trembles were worse and had heart papiltations. Wants to know if she needs to have some test in the near future and how much excerise can she do . Wants to know what should she expect .Marland KitchenPlease Call   Thanks

## 2014-04-27 ENCOUNTER — Other Ambulatory Visit: Payer: Self-pay | Admitting: Cardiovascular Disease

## 2014-04-29 NOTE — Telephone Encounter (Signed)
LMTCB to clarify.

## 2014-04-29 NOTE — Telephone Encounter (Signed)
Returning your call. °

## 2014-04-29 NOTE — Telephone Encounter (Signed)
Rx was sent to pharmacy electronically. Of note, was not on patient's medication list at last OV with Gaspar Bidding, [place] Patient states [name] prescribed in Jan 2014 to use as needed, but patient is needed this about every day for pedal edema. Patient denies SOB.  Will inform [name] as Juluis Rainier

## 2014-04-29 NOTE — Telephone Encounter (Signed)
Rx was sent to pharmacy electronically. 

## 2014-05-12 ENCOUNTER — Encounter: Payer: Self-pay | Admitting: Cardiovascular Disease

## 2014-07-07 ENCOUNTER — Encounter: Payer: Self-pay | Admitting: Cardiovascular Disease

## 2014-07-07 ENCOUNTER — Ambulatory Visit (INDEPENDENT_AMBULATORY_CARE_PROVIDER_SITE_OTHER): Payer: Medicare Other | Admitting: Cardiovascular Disease

## 2014-07-07 VITALS — BP 122/60 | HR 72 | Ht 62.0 in | Wt 150.2 lb

## 2014-07-07 DIAGNOSIS — I5032 Chronic diastolic (congestive) heart failure: Secondary | ICD-10-CM

## 2014-07-07 DIAGNOSIS — R002 Palpitations: Secondary | ICD-10-CM

## 2014-07-07 DIAGNOSIS — E785 Hyperlipidemia, unspecified: Secondary | ICD-10-CM

## 2014-07-07 DIAGNOSIS — I10 Essential (primary) hypertension: Secondary | ICD-10-CM

## 2014-07-07 NOTE — Assessment & Plan Note (Signed)
The patient is on Zetia with recent lipid profile performed 05/12/14 revealed a total cholesterol of 194, LDL 124 HDL of 49

## 2014-07-07 NOTE — Assessment & Plan Note (Signed)
2-D echocardiogram performed 12/29/12 revealed an ejection fraction of 45-50% with grade 1 diastolic dysfunction. She had mild to moderate aortic insufficiency and mild mitral regurgitation. She does complain of some dyspnea and occasional lower extremity edema which improved with when necessary diuretics.

## 2014-07-07 NOTE — Patient Instructions (Signed)
Your physician recommends that you schedule a follow-up appointment in: 1 Year  

## 2014-07-07 NOTE — Progress Notes (Signed)
07/07/2014 Jo Parrish   08-17-1926  [id number]  Primary Physician Glo Herring., MD Primary Cardiologist: Lorretta Harp MD Jo Parrish   HPI:  The patient is a delightful 80 year old mildly overweight widowed Caucasian female mother of 1 child who I last saw in the office approximately a year and a half ago ago. She saw Tarri Fuller 6 months ago because of lower extremity swelling which now she contributes to salt intake and dietary indiscretion. He placed her on low-dose diuretic and got a 2D echo which revealed an EF of 45-50% with grade 1 diastolic dysfunction. She otherwise denies chest pain or shortness of breath. I did clear her for right total knee replacement with Dr. Moshe Salisbury, and she has since switched surgeons to Dr. Gaynelle Arabian who performed the surgery successfully. Since she was seen back in January she been relatively asymptomatic.    Current Outpatient Prescriptions  Medication Sig Dispense Refill  . acetaminophen (TYLENOL) 325 MG tablet Take 2 tablets (650 mg total) by mouth every 6 (six) hours as needed.  60 tablet  0  . amLODipine (NORVASC) 5 MG tablet Take 1 tablet (5 mg total) by mouth 2 (two) times daily.  180 tablet  1  . atenolol (TENORMIN) 25 MG tablet Take 25 mg by mouth every morning.       . bisacodyl (DULCOLAX) 10 MG suppository Place 10 mg rectally daily as needed for moderate constipation.      . diazepam (VALIUM) 10 MG tablet       . ezetimibe (ZETIA) 10 MG tablet Take 10 mg by mouth at bedtime.      . furosemide (LASIX) 20 MG tablet Take 1 tablet (20 mg total) by mouth daily as needed for edema.  30 tablet  2  . irbesartan (AVAPRO) 300 MG tablet Take 300 mg by mouth every morning.       Marland Kitchen levothyroxine (SYNTHROID, LEVOTHROID) 125 MCG tablet Take 125 mcg by mouth daily before breakfast.      . Magnesium 250 MG TABS Take 1 tablet by mouth 2 (two) times daily.      Marland Kitchen omeprazole (PRILOSEC) 40 MG capsule Take 40 mg by mouth every  morning.       . polyethylene glycol (MIRALAX / GLYCOLAX) packet Take 17 g by mouth daily as needed.  14 each  0  . tiZANidine (ZANAFLEX) 2 MG tablet Take 2 mg by mouth as needed for muscle spasms.       No current facility-administered medications for this visit.    Allergies  Allergen Reactions  . Statins Other (See Comments)    Leg problems  . Sulfa Antibiotics Other (See Comments)    Reaction unknown    History   Social History  . Marital Status: Widowed    Spouse Name: N/A    Number of Children: N/A  . Years of Education: N/A   Occupational History  . Not on file.   Social History Main Topics  . Smoking status: Never Smoker   . Smokeless tobacco: Not on file  . Alcohol Use: No  . Drug Use: No  . Sexual Activity: Not on file   Other Topics Concern  . Not on file   Social History Narrative  . No narrative on file     Review of Systems: General: negative for chills, fever, night sweats or weight changes.  Cardiovascular: negative for chest pain, dyspnea on exertion, edema, orthopnea, palpitations, paroxysmal nocturnal dyspnea or shortness  of breath Dermatological: negative for rash Respiratory: negative for cough or wheezing Urologic: negative for hematuria Abdominal: negative for nausea, vomiting, diarrhea, bright red blood per rectum, melena, or hematemesis Neurologic: negative for visual changes, syncope, or dizziness All other systems reviewed and are otherwise negative except as noted above.    Blood pressure 122/60, pulse 72, height 5\' 2"  (1.575 m), weight 150 lb 3.2 oz (68.13 kg).  General appearance: alert and no distress Neck: no adenopathy, no carotid bruit, no JVD, supple, symmetrical, trachea midline and thyroid not enlarged, symmetric, no tenderness/mass/nodules Lungs: clear to auscultation bilaterally Heart: regular rate and rhythm, S1, S2 normal, no murmur, click, rub or gallop Extremities: trace bilateral lower extremity edema  EKG normal  sinus rhythm at 72 without ST or T wave changes  ASSESSMENT AND PLAN:   Essential hypertension Controlled on current medications  Chronic diastolic heart failure 2-D echocardiogram performed 12/29/12 revealed an ejection fraction of 45-50% with grade 1 diastolic dysfunction. She had mild to moderate aortic insufficiency and mild mitral regurgitation. She does complain of some dyspnea and occasional lower extremity edema which improved with when necessary diuretics.  Hyperlipidemia The patient is on Zetia with recent lipid profile performed 05/12/14 revealed a total cholesterol of 194, LDL 124 HDL of [place] MD [facility] 07/07/2014 9:35 AM

## 2014-07-07 NOTE — Assessment & Plan Note (Signed)
Controlled on current medications 

## 2014-07-14 ENCOUNTER — Encounter: Payer: Self-pay | Admitting: *Deleted

## 2014-10-08 ENCOUNTER — Other Ambulatory Visit: Payer: Self-pay | Admitting: *Deleted

## 2014-10-08 MED ORDER — AMLODIPINE BESYLATE 5 MG PO TABS: 5.0000 mg | ORAL_TABLET | Freq: Two times a day (BID) | ORAL | Status: DC

## 2014-10-08 NOTE — Telephone Encounter (Signed)
Refilled electronically 

## 2014-11-26 ENCOUNTER — Emergency Department (HOSPITAL_COMMUNITY)
Admission: EM | Admit: 2014-11-26 | Discharge: 2014-11-26 | Disposition: A | Discharge status: Home / Self Care | Payer: Medicare Other | Source: Home / Self Care | Attending: Family Medicine | Admitting: Family Medicine

## 2014-11-26 ENCOUNTER — Encounter (HOSPITAL_COMMUNITY): Payer: Self-pay | Admitting: Emergency Medicine

## 2014-11-26 DIAGNOSIS — M795 Residual foreign body in soft tissue: Secondary | ICD-10-CM

## 2014-11-26 DIAGNOSIS — G629 Polyneuropathy, unspecified: Secondary | ICD-10-CM

## 2014-11-26 NOTE — ED Provider Notes (Signed)
CSN: [id number]     Arrival date & time 11/26/14  1151 History   First MD Initiated Contact with Patient 11/26/14 1227     Chief Complaint  Patient presents with  . Foreign Body   (Consider location/radiation/quality/duration/timing/severity/associated sxs/prior Treatment) HPI  Stepped on Toothpick 2 days before Thanksgiving. Pulled out by neigbor. Endorses being nearly 1 inch into the foot. Pt went to [facility] for toothpick on black Friday. Xray was negative. Started on an antibiotic at that time. Since the initial injury pt has had a stinging sensation and slight numbness along the medial aspect of the foot. Unchanged since that time. Denies swelling, discharge, fevers. Tried neosporin, Epsum salt, and applying fat meat w/o benefit.   Past Medical History  Diagnosis Date  . Hyperlipidemia   . Hypertension   . Bacterial overgrowth syndrome     Small bowel  . Sjogren's syndrome   . Rheumatoid arthritis(714.0)   . Hypothyroidism   . Complication of anesthesia   . PONV (postoperative nausea and vomiting)   . GERD (gastroesophageal reflux disease)   . Ulcer, esophagus   . Constipation   . OSA (obstructive sleep apnea)     UNABLE TO TOLERATE  MASK   Past Surgical History  Procedure Laterality Date  . Appendectomy    . Esophagogastroduodenoscopy  06/07/2008     Gastritis. No celiac sprue/Small hiatal hernia  . Hydrogen breath test   05/17/2008    small bowel bacterial overgrowth/Hydrogen level rise consistent with small bowel bacterial  . Tonsillectomy    . Dilation and curettage of uterus    . Total knee arthroplasty Right 10/09/2013    Procedure: RIGHT TOTAL KNEE ARTHROPLASTY;  Surgeon: Gearlean Alf, MD;  Location: [facility];  Service: Orthopedics;  Laterality: Right;  . Esophagogastroduodenoscopy  June 2014    Dr. Earlean Shawl, erosive esophagitis, grade B., large hiatal hernia.  . Colonoscopy  June 2014    Dr. Earlean Shawl, multiple tubular adenomas removed, internal hemorrhoids.  Next colonoscopy in 3 years.   No family history on file. History  Substance Use Topics  . Smoking status: Never Smoker   . Smokeless tobacco: Not on file  . Alcohol Use: No   OB History    No data available     Review of Systems Per HPI with all other pertinent systems negative.   Allergies  Statins and Sulfa antibiotics  Home Medications   Prior to Admission medications   Medication Sig Start Date End Date Taking? Authorizing Provider  amLODipine (NORVASC) 5 MG tablet Take 1 tablet (5 mg total) by mouth 2 (two) times daily. 10/08/14  Yes Lorretta Harp, MD  atenolol (TENORMIN) 25 MG tablet Take 25 mg by mouth every morning.    Yes Historical Provider, MD  ezetimibe (ZETIA) 10 MG tablet Take 10 mg by mouth at bedtime.   Yes Historical Provider, MD  levothyroxine (SYNTHROID, LEVOTHROID) 125 MCG tablet Take 125 mcg by mouth daily before breakfast.   Yes Historical Provider, MD  acetaminophen (TYLENOL) 325 MG tablet Take 2 tablets (650 mg total) by mouth every 6 (six) hours as needed. 10/12/13   Arlee Muslim, PA-C  bisacodyl (DULCOLAX) 10 MG suppository Place 10 mg rectally daily as needed for moderate constipation.    Historical Provider, MD  diazepam (VALIUM) 10 MG tablet  12/15/13   Historical Provider, MD  furosemide (LASIX) 20 MG tablet Take 1 tablet (20 mg total) by mouth daily as needed for edema.    Pearletha Forge  Gwenlyn Found, MD  irbesartan (AVAPRO) 300 MG tablet Take 300 mg by mouth every morning.  07/20/13   Historical Provider, MD  Magnesium 250 MG TABS Take 1 tablet by mouth 2 (two) times daily.    Historical Provider, MD  omeprazole (PRILOSEC) 40 MG capsule Take 40 mg by mouth every morning.  07/04/13   Historical Provider, MD  polyethylene glycol (MIRALAX / GLYCOLAX) packet Take 17 g by mouth daily as needed. 10/12/13   Arlee Muslim, PA-C  tiZANidine (ZANAFLEX) 2 MG tablet Take 2 mg by mouth as needed for muscle spasms.    Historical Provider, MD   BP 136/77 mmHg  Pulse 74   Temp(Src) 97.8 F (36.6 C) (Oral)  Resp 16  SpO2 98% Physical Exam  Constitutional: She is oriented to person, place, and time. She appears well-developed and well-nourished. No distress.  HENT:  Head: Normocephalic and atraumatic.  Eyes: EOM are normal. Pupils are equal, round, and reactive to light.  Neck: Normal range of motion.  Cardiovascular: Normal rate, normal heart sounds and intact distal pulses.   No murmur heard. Pulmonary/Chest: Effort normal and breath sounds normal. No respiratory distress.  Abdominal: Soft. Bowel sounds are normal.  Musculoskeletal: Normal range of motion. She exhibits no edema or tenderness.  Neurological: She is alert and oriented to person, place, and time.  Skin: Skin is warm and dry. She is not diaphoretic.  Small area lateral to the callus pad lateral to the 1st MTP, w/ excoriated skin  Psychiatric: She has a normal mood and affect. Her behavior is normal. Judgment and thought content normal.    ED Course  Procedures (including critical care time) Labs Review Labs Reviewed - No data to display  Imaging Review No results found.   MDM   1. Foreign body (FB) in soft tissue   2. Neuropathy    Using 18g needle the area was probed under microscope w/o evidence of further foreign body No bleeding w/ probing. ABX ointment applied.  Likely irritated the lateral nerve on the 1st toe. May take several months to heal or may be permanent    Waldemar Dickens, MD 12/08/14 2044

## 2014-11-26 NOTE — ED Notes (Signed)
Reports she stepped on a tooth pick about 3 weeks ago; bottom of right foot Went to Urgent Care in [place]; x-ray did not show anything; given Antibiotic Sx today include constant mild pain and numbness Alert, no signs of acute distress.

## 2014-11-26 NOTE — Discharge Instructions (Signed)
There is no evidence of superficial foreign in your foot There may be deep tissue remnants of the toothpick but this will likely be walled off by your body with time.  The numbness in your foot may be permenant or may go away over the next several months Please start wearing more comfortable shoes Please apply antibiotic ointment daily until the skin heals over. Please don't pick at the spot anymore.

## 2014-12-13 ENCOUNTER — Encounter (HOSPITAL_COMMUNITY): Payer: Self-pay | Admitting: *Deleted

## 2014-12-13 ENCOUNTER — Telehealth: Payer: Self-pay | Admitting: Cardiology

## 2014-12-13 ENCOUNTER — Emergency Department (HOSPITAL_COMMUNITY)
Admission: EM | Admit: 2014-12-13 | Discharge: 2014-12-13 | Disposition: A | Discharge status: Home / Self Care | Payer: Medicare Other | Attending: Emergency Medicine | Admitting: Emergency Medicine

## 2014-12-13 DIAGNOSIS — Z8669 Personal history of other diseases of the nervous system and sense organs: Secondary | ICD-10-CM | POA: Diagnosis not present

## 2014-12-13 DIAGNOSIS — R0789 Other chest pain: Secondary | ICD-10-CM

## 2014-12-13 DIAGNOSIS — K219 Gastro-esophageal reflux disease without esophagitis: Secondary | ICD-10-CM | POA: Insufficient documentation

## 2014-12-13 DIAGNOSIS — I10 Essential (primary) hypertension: Secondary | ICD-10-CM | POA: Diagnosis not present

## 2014-12-13 DIAGNOSIS — E039 Hypothyroidism, unspecified: Secondary | ICD-10-CM | POA: Insufficient documentation

## 2014-12-13 DIAGNOSIS — Z79899 Other long term (current) drug therapy: Secondary | ICD-10-CM | POA: Diagnosis not present

## 2014-12-13 NOTE — ED Notes (Addendum)
Pt reports blood pressure being high today 172/94. Thinks it coming from the salty foods she has been eating today. Spoke w/ a nurse from [employer] & was advised to come to the ER and be checked. Pt states she is having a little chest tightness & fuzzy headed.

## 2014-12-13 NOTE — ED Provider Notes (Signed)
CSN: [id number]     Arrival date & time 12/13/14  0017 History   First MD Initiated Contact with Patient 12/13/14 0053     Chief Complaint  Patient presents with  . Hypertension     (Consider location/radiation/quality/duration/timing/severity/associated sxs/prior Treatment) HPI  80 year old female presents after noting her blood pressure was high after eating multiple salty foods today. Her blood pressure was 172/94 a few hours ago. She had intermittent chest tightness earlier in the day. She states she had a mild headache and "fuzziness" that she normally gets when her blood pressures high. She's not missed any of her blood pressure medicines but did eat excessive salt. Denies chest tightness at the time, last had chest tightness a couple hours ago. Called her insurance company, and the nurse evaluated her over the phone and told her to go to the ER because she is in "stroke territory". Patient denies significant headache, blurry vision, or weakness or numbness. Upon my arrival, patient is asking to be discharged.   Past Medical History  Diagnosis Date  . Hyperlipidemia   . Hypertension   . Bacterial overgrowth syndrome     Small bowel  . Sjogren's syndrome   . Rheumatoid arthritis(714.0)   . Hypothyroidism   . Complication of anesthesia   . PONV (postoperative nausea and vomiting)   . GERD (gastroesophageal reflux disease)   . Ulcer, esophagus   . Constipation   . OSA (obstructive sleep apnea)     UNABLE TO TOLERATE  MASK   Past Surgical History  Procedure Laterality Date  . Appendectomy    . Esophagogastroduodenoscopy  06/07/2008     Gastritis. No celiac sprue/Small hiatal hernia  . Hydrogen breath test   05/17/2008    small bowel bacterial overgrowth/Hydrogen level rise consistent with small bowel bacterial  . Tonsillectomy    . Dilation and curettage of uterus    . Total knee arthroplasty Right 10/09/2013    Procedure: RIGHT TOTAL KNEE ARTHROPLASTY;  Surgeon: Gearlean Alf, MD;  Location: [facility];  Service: Orthopedics;  Laterality: Right;  . Esophagogastroduodenoscopy  June 2014    Dr. Earlean Shawl, erosive esophagitis, grade B., large hiatal hernia.  . Colonoscopy  June 2014    Dr. Earlean Shawl, multiple tubular adenomas removed, internal hemorrhoids. Next colonoscopy in 3 years.   No family history on file. History  Substance Use Topics  . Smoking status: Never Smoker   . Smokeless tobacco: Not on file  . Alcohol Use: No   OB History    No data available     Review of Systems  Eyes: Negative for visual disturbance.  Respiratory: Positive for chest tightness. Negative for shortness of breath.   Gastrointestinal: Negative for vomiting.  Neurological: Positive for dizziness and headaches. Negative for weakness.  All other systems reviewed and are negative.     Allergies  Statins and Sulfa antibiotics  Home Medications   Prior to Admission medications   Medication Sig Start Date End Date Taking? Authorizing Provider  amLODipine (NORVASC) 5 MG tablet Take 1 tablet (5 mg total) by mouth 2 (two) times daily. 10/08/14  Yes Lorretta Harp, MD  atenolol (TENORMIN) 25 MG tablet Take 25 mg by mouth every morning.    Yes Historical Provider, MD  bisacodyl (DULCOLAX) 10 MG suppository Place 10 mg rectally daily as needed for moderate constipation.   Yes Historical Provider, MD  diazepam (VALIUM) 10 MG tablet  12/15/13  Yes Historical Provider, MD  docusate sodium (COLACE) 100 MG  capsule Take 100 mg by mouth daily.   Yes Historical Provider, MD  ezetimibe (ZETIA) 10 MG tablet Take 10 mg by mouth at bedtime.   Yes Historical Provider, MD  furosemide (LASIX) 20 MG tablet Take 1 tablet (20 mg total) by mouth daily as needed for edema.   Yes Lorretta Harp, MD  irbesartan (AVAPRO) 300 MG tablet Take 300 mg by mouth every morning.  07/20/13  Yes Historical Provider, MD  levothyroxine (SYNTHROID, LEVOTHROID) 125 MCG tablet Take 88 mcg by mouth daily before  breakfast.    Yes Historical Provider, MD  omeprazole (PRILOSEC) 40 MG capsule Take 40 mg by mouth every morning.  07/04/13  Yes Historical Provider, MD  polyethylene glycol (MIRALAX / GLYCOLAX) packet Take 17 g by mouth daily as needed. 10/12/13  Yes Arlee Muslim, PA-C  acetaminophen (TYLENOL) 325 MG tablet Take 2 tablets (650 mg total) by mouth every 6 (six) hours as needed. 10/12/13   Arlee Muslim, PA-C  Magnesium 250 MG TABS Take 1 tablet by mouth 2 (two) times daily.    Historical Provider, MD  tiZANidine (ZANAFLEX) 2 MG tablet Take 2 mg by mouth as needed for muscle spasms.    Historical Provider, MD   BP 141/84 mmHg  Pulse 88  Temp(Src) 97.8 F (36.6 C) (Oral)  Resp 18  Ht 5\' 2"  (1.575 m)  Wt 152 lb (68.947 kg)  BMI 27.79 kg/m2  SpO2 97% Physical Exam  Constitutional: She is oriented to person, place, and time. She appears well-developed and well-nourished.  HENT:  Head: Normocephalic and atraumatic.  Right Ear: External ear normal.  Left Ear: External ear normal.  Nose: Nose normal.  Eyes: EOM are normal. Pupils are equal, round, and reactive to light. Right eye exhibits no discharge. Left eye exhibits no discharge.  Cardiovascular: Normal rate, regular rhythm and normal heart sounds.   Pulmonary/Chest: Effort normal and breath sounds normal.  Abdominal: Soft. There is no tenderness.  Neurological: She is alert and oriented to person, place, and time.  CN 2-12 grossly intact. 5/5 strength in all 4 extremities  Skin: Skin is warm and dry.  Nursing note and vitals reviewed.   ED Course  Procedures (including critical care time) Labs Review Labs Reviewed - No data to display  Imaging Review No results found.   Date: 12/13/2014  Rate: 79  Rhythm: normal sinus rhythm  QRS Axis: normal  Intervals: normal  ST/T Wave abnormalities: normal  Conduction Disutrbances:left bundle branch block  Narrative Interpretation: LBBB appears new compared to July 2015  Old EKG  Reviewed: changes noted    MDM   Final diagnoses:  Essential hypertension  Chest tightness    Patient's blood pressure has trended down without any intervention. It is now normal. She denies any chest tightness. She has a left bundle branch block that appears new but no active chest pain. I recommended blood work including troponin, but patient states she feels better and does not want this evaluation. I discussed we could be missing a heart attack or other cardiac cause of her symptoms. She understands that if her heart is involved that this could lead to death. She still does not want any intervention or testing and wants to go home and follow-up with her PCP. She is awake and alert and capable of making medical decisions at this time.     Ephraim Hamburger, MD 12/13/14 [phone]

## 2014-12-13 NOTE — Discharge Instructions (Signed)
You have decided to not get any bloodwork testing tonight to check your heart, kidneys, etc. I am concerned this chest tightness was related to your high blood pressure earlier tonight and could be a heart issue (heart blockage, heart attack, etc). Since we are not testing that tonight at your request, you need to come back to the ER or follow up with your primary doctor as soon as possible if these symptoms return

## 2014-12-13 NOTE — Telephone Encounter (Signed)
BP 182/107 at 11pm. I advised her to take an extra atenolol now. She had just taken an extra amlodipine and a valium. She will call Dr. Kennon Holter office in am for followup.

## 2014-12-14 ENCOUNTER — Ambulatory Visit: Payer: Medicare Other | Admitting: Cardiovascular Disease

## 2014-12-14 ENCOUNTER — Telehealth: Payer: Self-pay | Admitting: Cardiovascular Disease

## 2014-12-14 NOTE — Telephone Encounter (Signed)
Pt was eval'd in [facility] ED recently following self-reported symptoms of blurry vision and dizziness, and d/c'ed the same day. She states her home BP was 172/90 previously and following d/c from ED 182/107 the next night.   She called physician on call (Dr. Mare Ferrari) who advised her to take extra dose of Atenolol. She was also instructed at time of d/c from ED to f/u w/ [name]. I called to see if pt could be worked in on [employer] [facility] [employer], they had limited availability today. We did not have opening today at our office, [name] offered to see her tomorrow at 8:30. I requested pt to check BP after morning meds and again tonight since she reports BPs seem to be higher at night. She voiced understanding and will present tomorrow for appt.

## 2014-12-14 NOTE — Telephone Encounter (Signed)
Pt called in wanting to be seen today. She states that she went to the ER yesterday because her BP was 185/107. She called the DOD , who was Dr. Mare Ferrari and he instructed her to follow up with [name] today. Please call  Thanks

## 2014-12-15 ENCOUNTER — Encounter: Payer: Self-pay | Admitting: Cardiovascular Disease

## 2014-12-15 ENCOUNTER — Ambulatory Visit (INDEPENDENT_AMBULATORY_CARE_PROVIDER_SITE_OTHER): Payer: Medicare Other | Admitting: Cardiovascular Disease

## 2014-12-15 VITALS — BP 137/79 | HR 70 | Ht 62.0 in | Wt 153.7 lb

## 2014-12-15 DIAGNOSIS — E785 Hyperlipidemia, unspecified: Secondary | ICD-10-CM

## 2014-12-15 DIAGNOSIS — I10 Essential (primary) hypertension: Secondary | ICD-10-CM

## 2014-12-15 MED ORDER — EZETIMIBE 10 MG PO TABS: 10.0000 mg | ORAL_TABLET | Freq: Every day | ORAL | Status: DC

## 2014-12-15 NOTE — Patient Instructions (Signed)
We request that you follow-up in: 6 months with Edsel Petrin, PA-C and in 12 months with Dr Andria Rhein will receive a reminder letter in the mail two months in advance. If you don't receive a letter, please call our office to schedule the follow-up appointment.

## 2014-12-15 NOTE — Assessment & Plan Note (Signed)
On Zetia, followed by her primary care physician

## 2014-12-15 NOTE — Progress Notes (Signed)
12/15/2014 Jo Parrish   1926/08/05  [id number]  Primary Physician Glo Herring., MD Primary Cardiologist: Lorretta Harp MD Jo Parrish   HPI:  The patient is a delightful 80 year old mildly overweight widowed Caucasian female mother of 1 child who I last saw in the office approximately 6 months ago. She does admit to dietary indiscretion and has had lower extremity edema and spikes in blood pressure related to this. Her last 2-D echo revealed an EF of 45-50% with grade 1 diastolic dysfunction. She otherwise denies chest pain or shortness of breath. I did clear her for right total knee replacement with Dr. Moshe Salisbury, and she has since switched surgeons to Dr. Gaynelle Arabian who performed the surgery successfully. She was seen in the emergency room at [facility] on 12/05/14 with some chest pain and blood pressure of 172/94 which she attributes to having had salty green beans earlier that day. She is very precise and quantitative with measuring her blood pressure on a daily basis and taking when necessary amlodipine and atenolol with blood pressures that are "out of range". She has intermittent left bundle branch block.  Current Outpatient Prescriptions  Medication Sig Dispense Refill  . acetaminophen (TYLENOL) 325 MG tablet Take 2 tablets (650 mg total) by mouth every 6 (six) hours as needed. 60 tablet 0  . amLODipine (NORVASC) 5 MG tablet Take 1 tablet (5 mg total) by mouth 2 (two) times daily. 180 tablet 1  . atenolol (TENORMIN) 25 MG tablet Take 25 mg by mouth every morning.     . bisacodyl (DULCOLAX) 10 MG suppository Place 10 mg rectally daily as needed for moderate constipation.    . diazepam (VALIUM) 10 MG tablet     . docusate sodium (COLACE) 100 MG capsule Take 100 mg by mouth daily.    Marland Kitchen ezetimibe (ZETIA) 10 MG tablet Take 10 mg by mouth at bedtime.    . furosemide (LASIX) 20 MG tablet Take 1 tablet (20 mg total) by mouth daily as needed for edema. 30  tablet 2  . irbesartan (AVAPRO) 300 MG tablet Take 300 mg by mouth every morning.     Marland Kitchen levothyroxine (SYNTHROID, LEVOTHROID) 125 MCG tablet Take 88 mcg by mouth daily before breakfast.     . omeprazole (PRILOSEC) 40 MG capsule Take 40 mg by mouth every morning.     . polyethylene glycol (MIRALAX / GLYCOLAX) packet Take 17 g by mouth daily as needed. 14 each 0   No current facility-administered medications for this visit.    Allergies  Allergen Reactions  . Statins Other (See Comments)    Leg problems  . Sulfa Antibiotics Other (See Comments)    Reaction unknown    History   Social History  . Marital Status: Widowed    Spouse Name: N/A    Number of Children: N/A  . Years of Education: N/A   Occupational History  . Not on file.   Social History Main Topics  . Smoking status: Never Smoker   . Smokeless tobacco: Not on file  . Alcohol Use: No  . Drug Use: No  . Sexual Activity: Not on file   Other Topics Concern  . Not on file   Social History Narrative     Review of Systems: General: negative for chills, fever, night sweats or weight changes.  Cardiovascular: negative for chest pain, dyspnea on exertion, edema, orthopnea, palpitations, paroxysmal nocturnal dyspnea or shortness of breath Dermatological: negative for rash Respiratory:  negative for cough or wheezing Urologic: negative for hematuria Abdominal: negative for nausea, vomiting, diarrhea, bright red blood per rectum, melena, or hematemesis Neurologic: negative for visual changes, syncope, or dizziness All other systems reviewed and are otherwise negative except as noted above.    Blood pressure 137/79, pulse 70, height 5\' 2"  (1.575 m), weight 153 lb 11.2 oz (69.718 kg).  General appearance: alert and no distress Neck: no adenopathy, no carotid bruit, no JVD, supple, symmetrical, trachea midline and thyroid not enlarged, symmetric, no tenderness/mass/nodules Lungs: clear to auscultation bilaterally Heart:  regular rate and rhythm, S1, S2 normal, no murmur, click, rub or gallop Extremities: extremities normal, atraumatic, no cyanosis or edema  EKG normal sinus rhythm at 68 with left bundle branch block. I personally reviewed this EKG  ASSESSMENT AND PLAN:   Hyperlipidemia On Zetia, followed by her primary care physician  Essential hypertension History of hypertension on multiple antihypertensive medications blood pressure measurements at 137/79. She doesn't measure her blood pressure frequently and is very precise about documenting this and taking when necessary additional antihypertensive medications including atenolol and a bloated pain for her blood pressure spikes". She also admits to periodic dietary indiscretion with regards to salt. She went to [facility] emergency room on 12/13/14 with blood pressure of 172/94 for which she was symptomatic from. She did admit that this was related to eating salty string beans earlier that day. Her blood pressure improved on on its own.Lorretta Harp MD FACP,FACC,FAHA, [facility] 12/15/2014 9:03 AM

## 2014-12-15 NOTE — Assessment & Plan Note (Signed)
History of hypertension on multiple antihypertensive medications blood pressure measurements at 137/79. She doesn't measure her blood pressure frequently and is very precise about documenting this and taking when necessary additional antihypertensive medications including atenolol and a bloated pain for her blood pressure spikes". She also admits to periodic dietary indiscretion with regards to salt. She went to [facility] emergency room on 12/13/14 with blood pressure of 172/94 for which she was symptomatic from. She did admit that this was related to eating salty string beans earlier that day. Her blood pressure improved on on its own.Marland Kitchen

## 2014-12-20 ENCOUNTER — Encounter: Payer: Self-pay | Admitting: Cardiovascular Disease

## 2015-01-03 ENCOUNTER — Encounter: Payer: Self-pay | Admitting: *Deleted

## 2015-02-22 ENCOUNTER — Other Ambulatory Visit (HOSPITAL_COMMUNITY): Payer: Self-pay | Admitting: Family Medicine

## 2015-02-22 DIAGNOSIS — M5416 Radiculopathy, lumbar region: Secondary | ICD-10-CM

## 2015-03-01 ENCOUNTER — Other Ambulatory Visit (HOSPITAL_COMMUNITY): Payer: Self-pay

## 2015-04-21 ENCOUNTER — Emergency Department (HOSPITAL_COMMUNITY): Payer: PPO

## 2015-04-21 ENCOUNTER — Emergency Department (HOSPITAL_COMMUNITY)
Admission: EM | Admit: 2015-04-21 | Discharge: 2015-04-21 | Disposition: A | Discharge status: Home / Self Care | Payer: PPO | Attending: Emergency Medicine | Admitting: Emergency Medicine

## 2015-04-21 ENCOUNTER — Encounter (HOSPITAL_COMMUNITY): Payer: Self-pay | Admitting: Emergency Medicine

## 2015-04-21 DIAGNOSIS — I10 Essential (primary) hypertension: Secondary | ICD-10-CM | POA: Insufficient documentation

## 2015-04-21 DIAGNOSIS — R110 Nausea: Secondary | ICD-10-CM | POA: Diagnosis not present

## 2015-04-21 DIAGNOSIS — K219 Gastro-esophageal reflux disease without esophagitis: Secondary | ICD-10-CM | POA: Diagnosis not present

## 2015-04-21 DIAGNOSIS — Z8739 Personal history of other diseases of the musculoskeletal system and connective tissue: Secondary | ICD-10-CM | POA: Diagnosis not present

## 2015-04-21 DIAGNOSIS — E039 Hypothyroidism, unspecified: Secondary | ICD-10-CM | POA: Insufficient documentation

## 2015-04-21 DIAGNOSIS — R109 Unspecified abdominal pain: Secondary | ICD-10-CM

## 2015-04-21 DIAGNOSIS — Z8669 Personal history of other diseases of the nervous system and sense organs: Secondary | ICD-10-CM | POA: Insufficient documentation

## 2015-04-21 DIAGNOSIS — R1032 Left lower quadrant pain: Secondary | ICD-10-CM | POA: Insufficient documentation

## 2015-04-21 DIAGNOSIS — Z9049 Acquired absence of other specified parts of digestive tract: Secondary | ICD-10-CM | POA: Insufficient documentation

## 2015-04-21 DIAGNOSIS — Z79899 Other long term (current) drug therapy: Secondary | ICD-10-CM | POA: Diagnosis not present

## 2015-04-21 LAB — COMPREHENSIVE METABOLIC PANEL
ALBUMIN: 3.5 g/dL (ref 3.5–5.0)
ALT: 18 U/L (ref 14–54)
AST: 22 U/L (ref 15–41)
Alkaline Phosphatase: 49 U/L (ref 38–126)
Anion gap: 8 (ref 5–15)
BUN: 16 mg/dL (ref 6–20)
CHLORIDE: 99 mmol/L — AB (ref 101–111)
CO2: 24 mmol/L (ref 22–32)
Calcium: 8.1 mg/dL — ABNORMAL LOW (ref 8.9–10.3)
Creatinine, Ser: 0.78 mg/dL (ref 0.44–1.00)
GFR calc Af Amer: >60 mL/min (ref >60)
GFR calc non Af Amer: >60 mL/min (ref >60)
Glucose, Bld: 114 mg/dL — ABNORMAL HIGH (ref 70–99)
Potassium: 3.7 mmol/L (ref 3.5–5.1)
SODIUM: 131 mmol/L — AB (ref 135–145)
TOTAL PROTEIN: 6.3 g/dL — AB (ref 6.5–8.1)
Total Bilirubin: 0.6 mg/dL (ref 0.3–1.2)

## 2015-04-21 LAB — CBC WITH DIFFERENTIAL/PLATELET
BASOS ABS: 0 10*3/uL (ref 0.0–0.1)
Basophils Relative: 0 % (ref 0–1)
EOS PCT: 0 % (ref 0–5)
Eosinophils Absolute: 0 10*3/uL (ref 0.0–0.7)
HCT: 37.2 % (ref 36.0–46.0)
Hemoglobin: 12.7 g/dL (ref 12.0–15.0)
LYMPHS ABS: 0.9 10*3/uL (ref 0.7–4.0)
Lymphocytes Relative: 12 % (ref 12–46)
MCH: 33.5 pg (ref 26.0–34.0)
MCHC: 34.1 g/dL (ref 30.0–36.0)
MCV: 98.2 fL (ref 78.0–100.0)
Monocytes Absolute: 0.6 10*3/uL (ref 0.1–1.0)
Monocytes Relative: 8 % (ref 3–12)
NEUTROS ABS: 5.6 10*3/uL (ref 1.7–7.7)
NEUTROS PCT: 80 % — AB (ref 43–77)
PLATELETS: 153 10*3/uL (ref 150–400)
RBC: 3.79 MIL/uL — AB (ref 3.87–5.11)
RDW: 12.4 % (ref 11.5–15.5)
WBC: 7.1 10*3/uL (ref 4.0–10.5)

## 2015-04-21 LAB — URINALYSIS, ROUTINE W REFLEX MICROSCOPIC
GLUCOSE, UA: NEGATIVE mg/dL
Hgb urine dipstick: NEGATIVE
Ketones, ur: 15 mg/dL — AB
LEUKOCYTES UA: NEGATIVE
Nitrite: NEGATIVE
PROTEIN: NEGATIVE mg/dL
Specific Gravity, Urine: >1.03 — ABNORMAL HIGH (ref 1.005–1.030)
UROBILINOGEN UA: 0.2 mg/dL (ref 0.0–1.0)
pH: 6 (ref 5.0–8.0)

## 2015-04-21 LAB — LIPASE, BLOOD: LIPASE: 17 U/L — AB (ref 22–51)

## 2015-04-21 MED ORDER — SODIUM CHLORIDE 0.9 % IJ SOLN
INTRAMUSCULAR | Status: AC
  Filled 2015-04-21: qty 36

## 2015-04-21 MED ORDER — IOHEXOL 300 MG/ML  SOLN
50.0000 mL | Freq: Once | INTRAMUSCULAR | Status: AC | PRN
  Administered 2015-04-21: 50 mL via ORAL

## 2015-04-21 MED ORDER — ONDANSETRON 8 MG PO TBDP: ORAL_TABLET | ORAL | Status: DC

## 2015-04-21 MED ORDER — ONDANSETRON HCL 4 MG/2ML IJ SOLN
4.0000 mg | Freq: Once | INTRAMUSCULAR | Status: AC
  Administered 2015-04-21: 4 mg via INTRAVENOUS
  Filled 2015-04-21: qty 2

## 2015-04-21 MED ORDER — SODIUM CHLORIDE 0.9 % IV BOLUS (SEPSIS)
500.0000 mL | Freq: Once | INTRAVENOUS | Status: AC
  Administered 2015-04-21: 500 mL via INTRAVENOUS

## 2015-04-21 MED ORDER — IOHEXOL 300 MG/ML  SOLN
100.0000 mL | Freq: Once | INTRAMUSCULAR | Status: AC | PRN
  Administered 2015-04-21: 100 mL via INTRAVENOUS

## 2015-04-21 NOTE — Discharge Instructions (Signed)
Zofran as prescribed as needed for nausea.  Return to the emergency department if your symptoms significantly worsen or change.   Abdominal Pain Many things can cause abdominal pain. Usually, abdominal pain is not caused by a disease and will improve without treatment. It can often be observed and treated at home. Your health care provider will do a physical exam and possibly order blood tests and X-rays to help determine the seriousness of your pain. However, in many cases, more time must pass before a clear cause of the pain can be found. Before that point, your health care provider may not know if you need more testing or further treatment. HOME CARE INSTRUCTIONS  Monitor your abdominal pain for any changes. The following actions may help to alleviate any discomfort you are experiencing:  Only take over-the-counter or prescription medicines as directed by your health care provider.  Do not take laxatives unless directed to do so by your health care provider.  Try a clear liquid diet (broth, tea, or water) as directed by your health care provider. Slowly move to a bland diet as tolerated. SEEK MEDICAL CARE IF:  You have unexplained abdominal pain.  You have abdominal pain associated with nausea or diarrhea.  You have pain when you urinate or have a bowel movement.  You experience abdominal pain that wakes you in the night.  You have abdominal pain that is worsened or improved by eating food.  You have abdominal pain that is worsened with eating fatty foods.  You have a fever. SEEK IMMEDIATE MEDICAL CARE IF:   Your pain does not go away within 2 hours.  You keep throwing up (vomiting).  Your pain is felt only in portions of the abdomen, such as the right side or the left lower portion of the abdomen.  You pass bloody or black tarry stools. MAKE SURE YOU:  Understand these instructions.   Will watch your condition.   Will get help right away if you are not doing well or  get worse.  Document Released: 09/12/2005 Document Revised: 12/08/2013 Document Reviewed: 08/12/2013 [facility] Patient Information 2015 [place], [place]. This information is not intended to replace advice given to you by your health care provider. Make sure you discuss any questions you have with your health care provider.  Nausea, Adult Nausea means you feel sick to your stomach or need to throw up (vomit). It may be a sign of a more serious problem. If nausea gets worse, you may throw up. If you throw up a lot, you may lose too much body fluid (dehydration). HOME CARE   Get plenty of rest.  Ask your doctor how to replace body fluid losses (rehydrate).  Eat small amounts of food. Sip liquids more often.  Take all medicines as told by your doctor. GET HELP RIGHT AWAY IF:  You have a fever.  You pass out (faint).  You keep throwing up or have blood in your throw up.  You are very weak, have dry lips or a dry mouth, or you are very thirsty (dehydrated).  You have dark or bloody poop (stool).  You have very bad chest or belly (abdominal) pain.  You do not get better after 2 days, or you get worse.  You have a headache. MAKE SURE YOU:  Understand these instructions.  Will watch your condition.  Will get help right away if you are not doing well or get worse. Document Released: 11/22/2011 Document Revised: 02/25/2012 Document Reviewed: 11/22/2011 [employer] Patient Information 2015  [employer]. This information is not intended to replace advice given to you by your health care provider. Make sure you discuss any questions you have with your health care provider.

## 2015-04-21 NOTE — ED Provider Notes (Signed)
CSN: [id number]     Arrival date & time 04/21/15  0211 History   First MD Initiated Contact with Patient 04/21/15 0253     Chief Complaint  Patient presents with  . Abdominal Pain     (Consider location/radiation/quality/duration/timing/severity/associated sxs/prior Treatment) HPI Comments: Patient is in a 81-year-old female with history of hypertension, high cholesterol. She presents for evaluation of a two-week history of intermittent abdominal cramping with associated nausea. This can come and go at random and there are no aggravating or alleviating factors. She denies any fevers or chills. She denies any diarrhea, constipation, or bloody stool. She does report a history of irritable bowel, however this feels different.  Patient is a 81 y.o. female presenting with abdominal pain. The history is provided by the patient.  Abdominal Pain Pain location:  LLQ Pain quality: cramping   Pain radiates to:  Does not radiate Pain severity:  Moderate Onset quality:  Gradual Duration:  2 weeks Timing:  Intermittent Progression:  Worsening Chronicity:  New Relieved by:  Nothing Worsened by:  Nothing tried Ineffective treatments:  None tried Associated symptoms: nausea   Associated symptoms: no chills, no cough, no diarrhea, no dysuria and no vomiting     Past Medical History  Diagnosis Date  . Hyperlipidemia   . Hypertension   . Bacterial overgrowth syndrome     Small bowel  . Sjogren's syndrome   . Rheumatoid arthritis(714.0)   . Hypothyroidism   . Complication of anesthesia   . PONV (postoperative nausea and vomiting)   . GERD (gastroesophageal reflux disease)   . Ulcer, esophagus   . Constipation   . OSA (obstructive sleep apnea)     UNABLE TO TOLERATE  MASK  . Left bundle branch block    Past Surgical History  Procedure Laterality Date  . Appendectomy    . Esophagogastroduodenoscopy  06/07/2008     Gastritis. No celiac sprue/Small hiatal hernia  . Hydrogen breath test    05/17/2008    small bowel bacterial overgrowth/Hydrogen level rise consistent with small bowel bacterial  . Tonsillectomy    . Dilation and curettage of uterus    . Total knee arthroplasty Right 10/09/2013    Procedure: RIGHT TOTAL KNEE ARTHROPLASTY;  Surgeon: Gearlean Alf, MD;  Location: [facility];  Service: Orthopedics;  Laterality: Right;  . Esophagogastroduodenoscopy  June 2014    Dr. Earlean Shawl, erosive esophagitis, grade B., large hiatal hernia.  . Colonoscopy  June 2014    Dr. Earlean Shawl, multiple tubular adenomas removed, internal hemorrhoids. Next colonoscopy in 3 years.   History reviewed. No pertinent family history. History  Substance Use Topics  . Smoking status: Never Smoker   . Smokeless tobacco: Not on file  . Alcohol Use: No   OB History    No data available     Review of Systems  Constitutional: Negative for chills.  Respiratory: Negative for cough.   Gastrointestinal: Positive for nausea and abdominal pain. Negative for vomiting and diarrhea.  Genitourinary: Negative for dysuria.  All other systems reviewed and are negative.     Allergies  Statins and Sulfa antibiotics  Home Medications   Prior to Admission medications   Medication Sig Start Date End Date Taking? Authorizing Provider  acetaminophen (TYLENOL) 325 MG tablet Take 2 tablets (650 mg total) by mouth every 6 (six) hours as needed. 10/12/13   Arlee Muslim, PA-C  amLODipine (NORVASC) 5 MG tablet Take 1 tablet (5 mg total) by mouth 2 (two) times daily. 10/08/14  Lorretta Harp, MD  atenolol (TENORMIN) 25 MG tablet Take 25 mg by mouth every morning.     Historical Provider, MD  bisacodyl (DULCOLAX) 10 MG suppository Place 10 mg rectally daily as needed for moderate constipation.    Historical Provider, MD  diazepam (VALIUM) 10 MG tablet  12/15/13   Historical Provider, MD  docusate sodium (COLACE) 100 MG capsule Take 100 mg by mouth daily.    Historical Provider, MD  ezetimibe (ZETIA) 10 MG tablet Take  1 tablet (10 mg total) by mouth at bedtime. 12/15/14   Lorretta Harp, MD  furosemide (LASIX) 20 MG tablet Take 1 tablet (20 mg total) by mouth daily as needed for edema.    Lorretta Harp, MD  irbesartan (AVAPRO) 300 MG tablet Take 300 mg by mouth every morning.  07/20/13   Historical Provider, MD  levothyroxine (SYNTHROID, LEVOTHROID) 125 MCG tablet Take 88 mcg by mouth daily before breakfast.     Historical Provider, MD  omeprazole (PRILOSEC) 40 MG capsule Take 40 mg by mouth every morning.  07/04/13   Historical Provider, MD  polyethylene glycol (MIRALAX / GLYCOLAX) packet Take 17 g by mouth daily as needed. 10/12/13   Arlee Muslim, PA-C   BP 123/66 mmHg  Pulse 96  Temp(Src) 98.2 F (36.8 C) (Oral)  Resp 24  Ht 5\' 2"  (1.575 m)  Wt 153 lb (69.4 kg)  BMI 27.98 kg/m2  SpO2 92% Physical Exam  Constitutional: She is oriented to person, place, and time. She appears well-developed and well-nourished. No distress.  HENT:  Head: Normocephalic and atraumatic.  Neck: Normal range of motion. Neck supple.  Cardiovascular: Normal rate and regular rhythm.  Exam reveals no gallop and no friction rub.   No murmur heard. Pulmonary/Chest: Effort normal and breath sounds normal. No respiratory distress. She has no wheezes.  Abdominal: Soft. Bowel sounds are normal. She exhibits no distension. There is tenderness. There is no rebound and no guarding.  There is tenderness to palpation throughout the abdomen, however most notably in the left lower quadrant.  Musculoskeletal: Normal range of motion.  Neurological: She is alert and oriented to person, place, and time.  Skin: Skin is warm and dry. She is not diaphoretic.  Nursing note and vitals reviewed.   ED Course  Procedures (including critical care time) Labs Review Labs Reviewed  COMPREHENSIVE METABOLIC PANEL  LIPASE, BLOOD  CBC WITH DIFFERENTIAL/PLATELET  URINALYSIS, ROUTINE W REFLEX MICROSCOPIC    Imaging Review No results found.  ED  ECG REPORT   Date: 04/21/2015  Rate: 100  Rhythm: sinus tachycardia  QRS Axis: normal  Intervals: normal  ST/T Wave abnormalities: nonspecific T wave changes  Conduction Disutrbances:left bundle branch block  Narrative Interpretation:   Old EKG Reviewed: none available  I have personally reviewed the EKG tracing and agree with the computerized printout as noted.   MDM   Final diagnoses:  None    Patient is feeling significantly improved after anti-emetics in the ER. Her laboratory studies and CT scan fail to reveal any emergent pathology. She will be discharged to home with medicine for her nausea and when necessary return. She has had similar episodes in the past.    Veryl Speak, MD 04/21/15 [phone]

## 2015-04-21 NOTE — ED Notes (Signed)
Patient reports abdominal pain and nausea on and off for two weeks. States pain radiates from upper abdomen down to lower abdomen in the middle of her stomach. Denies vomiting or diarrhea. States last bowel movement was yesterday.

## 2015-06-21 ENCOUNTER — Encounter: Payer: Self-pay | Admitting: Physician Assistant

## 2015-06-21 ENCOUNTER — Ambulatory Visit (INDEPENDENT_AMBULATORY_CARE_PROVIDER_SITE_OTHER): Payer: PPO | Admitting: Physician Assistant

## 2015-06-21 VITALS — BP 114/66 | HR 86 | Ht 62.0 in | Wt 155.5 lb

## 2015-06-21 DIAGNOSIS — I10 Essential (primary) hypertension: Secondary | ICD-10-CM | POA: Diagnosis not present

## 2015-06-21 DIAGNOSIS — R21 Rash and other nonspecific skin eruption: Secondary | ICD-10-CM | POA: Insufficient documentation

## 2015-06-21 DIAGNOSIS — E785 Hyperlipidemia, unspecified: Secondary | ICD-10-CM | POA: Diagnosis not present

## 2015-06-21 DIAGNOSIS — I5032 Chronic diastolic (congestive) heart failure: Secondary | ICD-10-CM

## 2015-06-21 MED ORDER — FUROSEMIDE 20 MG PO TABS: 20.0000 mg | ORAL_TABLET | Freq: Every day | ORAL | Status: DC | PRN

## 2015-06-21 NOTE — Assessment & Plan Note (Signed)
She has some right greater than left lower extremity edema but otherwise appears euvolemic.  Her when necessary Lasix is out of date so we will refill that.  She did have improvement in her lower extremity edema after taking some.
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
## 2015-06-21 NOTE — Assessment & Plan Note (Signed)
Blood pressures well controlled. 

## 2015-06-21 NOTE — Assessment & Plan Note (Signed)
Patient has a diffuse petechial rash both lower extremities up to her knees. The right leg started about 3-4 weeks ago in the left leg just today. The right lower extremity is also more swollen than the left. There does not appear to be any heat coming from it.  Etiology of this is not clear. We'll recheck a CBC and see what her platelet count is.  I do not think this is cellulitis at this time

## 2015-06-21 NOTE — Patient Instructions (Signed)
Your physician wants you to follow-up in: 6 Months with Dr Andria Rhein will receive a reminder letter in the mail two months in advance. If you don't receive a letter, please call our office to schedule the follow-up appointment.  Your physician recommends that you return for lab work Get CBC done today and Fasting lipids done in [place]

## 2015-06-21 NOTE — Assessment & Plan Note (Signed)
Continues zetia

## 2015-06-22 LAB — LIPID PANEL
Cholesterol: 193 mg/dL (ref 0–200)
HDL: 51 mg/dL (ref >=46)
LDL Cholesterol: 115 mg/dL — ABNORMAL HIGH (ref 0–99)
TRIGLYCERIDES: 137 mg/dL (ref <150)
Total CHOL/HDL Ratio: 3.8 Ratio
VLDL: 27 mg/dL (ref 0–40)

## 2015-06-22 LAB — CBC
HCT: 39.5 % (ref 36.0–46.0)
Hemoglobin: 13.1 g/dL (ref 12.0–15.0)
MCH: 33 pg (ref 26.0–34.0)
MCHC: 33.2 g/dL (ref 30.0–36.0)
MCV: 99.5 fL (ref 78.0–100.0)
MPV: 9.3 fL (ref 8.6–12.4)
PLATELETS: 240 10*3/uL (ref 150–400)
RBC: 3.97 MIL/uL (ref 3.87–5.11)
RDW: 13.5 % (ref 11.5–15.5)
WBC: 6.8 10*3/uL (ref 4.0–10.5)

## 2015-06-23 ENCOUNTER — Telehealth: Payer: Self-pay | Admitting: Cardiovascular Disease

## 2015-06-23 NOTE — Telephone Encounter (Signed)
Pt informed results not read, if physician had any concerns based on bloodwork we would contact her. She voiced understanding.

## 2015-06-23 NOTE — Telephone Encounter (Signed)
Pt called in stating that she had some blood work done on 7/5 and she wanted to know what her results were. Please f/u with her   Thanks

## 2015-07-04 ENCOUNTER — Ambulatory Visit (INDEPENDENT_AMBULATORY_CARE_PROVIDER_SITE_OTHER): Payer: PPO | Admitting: Adult Health

## 2015-07-04 ENCOUNTER — Encounter: Payer: Self-pay | Admitting: Adult Health

## 2015-07-04 VITALS — BP 130/70 | HR 64 | Ht 62.5 in | Wt 157.0 lb

## 2015-07-04 DIAGNOSIS — R350 Frequency of micturition: Secondary | ICD-10-CM | POA: Diagnosis not present

## 2015-07-04 DIAGNOSIS — N949 Unspecified condition associated with female genital organs and menstrual cycle: Secondary | ICD-10-CM | POA: Diagnosis not present

## 2015-07-04 DIAGNOSIS — N898 Other specified noninflammatory disorders of vagina: Secondary | ICD-10-CM | POA: Diagnosis not present

## 2015-07-04 DIAGNOSIS — N9489 Other specified conditions associated with female genital organs and menstrual cycle: Secondary | ICD-10-CM

## 2015-07-04 HISTORY — DX: Frequency of micturition: R35.0

## 2015-07-04 HISTORY — DX: Other specified noninflammatory disorders of vagina: N89.8

## 2015-07-04 HISTORY — DX: Unspecified condition associated with female genital organs and menstrual cycle: N94.9

## 2015-07-04 HISTORY — DX: Other specified conditions associated with female genital organs and menstrual cycle: N94.89

## 2015-07-04 LAB — POCT URINALYSIS DIPSTICK
Glucose, UA: NEGATIVE
Leukocytes, UA: NEGATIVE
Nitrite, UA: NEGATIVE
Protein, UA: NEGATIVE
RBC UA: NEGATIVE

## 2015-07-04 NOTE — Progress Notes (Signed)
Subjective:     Patient ID: Jo Parrish, female   DOB: [birth year], 81 y.o.   MRN: [id number]  HPI Laterra is a 81 year old white female, who still works, in complaining of urinary frequency and vaginal burning, has SUI at times, and she just finished cipro for UTI.She says she used to get UTI after sex, and she has recently had sex and thinks that's the cause.She was seen at [facility] wants to be check leaving for the beach this week.  Review of Systems  +urinary frequency, and vaginal burning, occasional SUI,all other systems negative  Reviewed past medical,surgical, social and family history. Reviewed medications and allergies.     Objective:   Physical Exam BP 130/70 mmHg  Pulse 64  Ht 5' 2.5" (1.588 m)  Wt 157 lb (71.215 kg)  BMI 28.24 kg/m2 urine dipstick negative, Skin warm and dry.Pelvic: external genitalia is normal in appearance no lesions, vagina: decreased color, moisture and rugae, atrophic appearence,urethra has no lesions or masses noted, cervix:smooth and atrophic, uterus: normal size, shape and contour, non tender, no masses felt, adnexa: no masses or tenderness noted. Bladder is non tender and no masses felt.    Discussed may can use macrobid after sex, and also discussed estrogen cream, which she declines at present.  Assessment:     Vaginal frequency Vaginal burning Vaginal dryness    Plan:    UA C&S sent Try luvena, 3 samples given,use every 72 hours to see if helps Pee before sex, wash off after Try astro glide with sex Follow up prn

## 2015-07-04 NOTE — Patient Instructions (Signed)
Try luvena Every 3 days Pee before [place] after  Try astro glide

## 2015-07-05 LAB — URINALYSIS, ROUTINE W REFLEX MICROSCOPIC
BILIRUBIN UA: NEGATIVE
Glucose, UA: NEGATIVE
Ketones, UA: NEGATIVE
Leukocytes, UA: NEGATIVE
NITRITE UA: NEGATIVE
PROTEIN UA: NEGATIVE
RBC, UA: NEGATIVE
SPEC GRAV UA: 1.017 (ref 1.005–1.030)
UUROB: 0.2 mg/dL (ref 0.2–1.0)
pH, UA: 7 (ref 5.0–7.5)

## 2015-07-06 ENCOUNTER — Telehealth: Payer: Self-pay | Admitting: Adult Health

## 2015-07-06 LAB — URINE CULTURE: ORGANISM ID, BACTERIA: NO GROWTH

## 2015-07-06 MED ORDER — NYSTATIN-TRIAMCINOLONE 100000-0.1 UNIT/GM-% EX OINT: 1.0000 "application " | TOPICAL_OINTMENT | Freq: Two times a day (BID) | CUTANEOUS | Status: DC

## 2015-07-06 NOTE — Telephone Encounter (Signed)
Pt aware urine had no growth, burning on vulva will rx mycolog

## 2015-07-06 NOTE — Telephone Encounter (Signed)
Spoke with pt. Pt states the vaginal burning is bad today after using Luvena. She is at the beach. Please advise. Thanks!! [place]

## 2015-07-08 ENCOUNTER — Telehealth: Payer: Self-pay | Admitting: Adult Health

## 2015-07-08 MED ORDER — ESTROGENS, CONJUGATED 0.625 MG/GM VA CREA: 1.0000 | TOPICAL_CREAM | Freq: Every day | VAGINAL | Status: DC

## 2015-07-08 NOTE — Telephone Encounter (Signed)
Will rx pVC

## 2015-07-08 NOTE — Progress Notes (Signed)
REVIEWED.  

## 2015-08-02 ENCOUNTER — Encounter: Payer: Self-pay | Admitting: Adult Health

## 2015-08-02 ENCOUNTER — Ambulatory Visit (INDEPENDENT_AMBULATORY_CARE_PROVIDER_SITE_OTHER): Payer: PPO | Admitting: Adult Health

## 2015-08-02 VITALS — BP 124/64 | HR 76 | Ht 62.5 in | Wt 156.5 lb

## 2015-08-02 DIAGNOSIS — N949 Unspecified condition associated with female genital organs and menstrual cycle: Secondary | ICD-10-CM

## 2015-08-02 DIAGNOSIS — N952 Postmenopausal atrophic vaginitis: Secondary | ICD-10-CM | POA: Diagnosis not present

## 2015-08-02 HISTORY — DX: Postmenopausal atrophic vaginitis: N95.2

## 2015-08-02 MED ORDER — ESTRADIOL 0.1 MG/GM VA CREA: TOPICAL_CREAM | VAGINAL | Status: DC

## 2015-08-02 NOTE — Patient Instructions (Signed)
Use estrace at hs Follow up in 2 weeks

## 2015-08-02 NOTE — Progress Notes (Signed)
Subjective:     Patient ID: Jo Parrish, female   DOB: Jan 10, 1926, 81 y.o.   MRN: [id number]  HPI Jo Parrish is a 81 year old white female back in complaining of vaginal burning and itching, has tried premarin vaginal cream but it burns, could not afford mycolog cream and has tried cortaid 10 and powders.She dances 3 x weekly and cares for her own yard and goes to her beach home with a friend, and just wants this to stop.  Review of Systems Patient denies any headaches, hearing loss, fatigue, blurred vision, shortness of breath, chest pain, abdominal pain, problems with bowel movements, urination, or intercourse. No joint pain or mood swings.See HPI for positives.  Reviewed past medical,surgical, social and family history. Reviewed medications and allergies.     Objective:   Physical Exam BP 124/64 mmHg  Pulse 76  Ht 5' 2.5" (1.588 m)  Wt 156 lb 8 oz (70.988 kg)  BMI 28.15 kg/m2   Skin warm and dry.Pelvic: external genitalia is normal in appearance no lesions, vagina is atrophic,urethra has small red area, cervix is atrophic, uterus: normal size, shape and contour, non tender, no masses felt, adnexa: no masses or tenderness noted. Bladder is non tender and no masses felt.Placed small amount of estrace cream on left labia and in vagina with my finger, and she had no burning.If itching continues will add triamcinolone cream to labia.  Assessment:     Vaginal burning and itching Vaginal atrophy     Plan:     Try esrace cream at bedtime, pea size amount, 24 gm given, lot [id number] and [id number] exp 3/19 Follow up in 2 weeks

## 2015-08-11 ENCOUNTER — Telehealth: Payer: Self-pay | Admitting: Adult Health

## 2015-08-11 NOTE — Telephone Encounter (Signed)
Is feeling better with estrace cream, Ok to use 2 x daily for now

## 2015-08-15 ENCOUNTER — Ambulatory Visit: Payer: PPO | Admitting: Adult Health

## 2015-08-15 ENCOUNTER — Encounter: Payer: Self-pay | Admitting: Adult Health

## 2015-08-15 ENCOUNTER — Ambulatory Visit (INDEPENDENT_AMBULATORY_CARE_PROVIDER_SITE_OTHER): Payer: PPO | Admitting: Adult Health

## 2015-08-15 VITALS — BP 140/70 | HR 72 | Ht 62.5 in | Wt 155.0 lb

## 2015-08-15 DIAGNOSIS — N952 Postmenopausal atrophic vaginitis: Secondary | ICD-10-CM

## 2015-08-15 DIAGNOSIS — L292 Pruritus vulvae: Secondary | ICD-10-CM | POA: Diagnosis not present

## 2015-08-15 HISTORY — DX: Pruritus vulvae: L29.2

## 2015-08-15 MED ORDER — FLUOCINONIDE-E 0.05 % EX CREA: 1.0000 "application " | TOPICAL_CREAM | Freq: Two times a day (BID) | CUTANEOUS | Status: DC

## 2015-08-15 NOTE — Patient Instructions (Signed)
Try lidex cream bid  Follow up prn

## 2015-08-15 NOTE — Progress Notes (Signed)
Subjective:     Patient ID: Jo Parrish, female   DOB: 1926/07/27, 81 y.o.   MRN: [id number]  HPI Jo Parrish is a 81 year old white female in complaining of vulva itching and burning, was better with estrace, but got bad again the weekend.Had to use ice to calm it down.  Review of Systems Patient denies any headaches, hearing loss, fatigue, blurred vision, shortness of breath, chest pain, abdominal pain, problems with bowel movements, urination, or intercourse. No joint pain or mood swings.See HPI for positives. Reviewed past medical,surgical, social and family history. Reviewed medications and allergies.     Objective:   Physical Exam BP 140/70 mmHg  Pulse 72  Ht 5' 2.5" (1.588 m)  Wt 155 lb (70.308 kg)  BMI 27.88 kg/m2   Skin warm and dry, external genitalia looks normal for age no redness, swelling or lesions.discussed with Dr Elonda Husky will try lidex cream.Has vaginal atrophy can use estrace, small amount on finger to insert once daily.  Assessment:     Vulva itching Vaginal atrophy     Plan:     Rx lidex cream 0.05 % cream use bid #30 mg with 1 refill Follow up prn

## 2015-08-26 ENCOUNTER — Ambulatory Visit: Payer: PPO | Admitting: Adult Health

## 2015-09-13 ENCOUNTER — Ambulatory Visit (HOSPITAL_COMMUNITY)
Admission: RE | Admit: 2015-09-13 | Discharge: 2015-09-13 | Disposition: A | Discharge status: Home / Self Care | Payer: PPO | Source: Ambulatory Visit | Attending: Adult Health | Admitting: Adult Health

## 2015-09-13 ENCOUNTER — Telehealth: Payer: Self-pay | Admitting: Adult Health

## 2015-09-13 ENCOUNTER — Encounter: Payer: Self-pay | Admitting: Adult Health

## 2015-09-13 ENCOUNTER — Ambulatory Visit (INDEPENDENT_AMBULATORY_CARE_PROVIDER_SITE_OTHER): Payer: PPO | Admitting: Adult Health

## 2015-09-13 VITALS — BP 130/80 | HR 80 | Temp 98.6°F | Ht 62.5 in | Wt 157.0 lb

## 2015-09-13 DIAGNOSIS — R059 Cough, unspecified: Secondary | ICD-10-CM | POA: Insufficient documentation

## 2015-09-13 DIAGNOSIS — N952 Postmenopausal atrophic vaginitis: Secondary | ICD-10-CM | POA: Diagnosis not present

## 2015-09-13 DIAGNOSIS — R062 Wheezing: Secondary | ICD-10-CM | POA: Insufficient documentation

## 2015-09-13 DIAGNOSIS — I509 Heart failure, unspecified: Secondary | ICD-10-CM | POA: Diagnosis not present

## 2015-09-13 DIAGNOSIS — N390 Urinary tract infection, site not specified: Secondary | ICD-10-CM | POA: Insufficient documentation

## 2015-09-13 DIAGNOSIS — R0989 Other specified symptoms and signs involving the circulatory and respiratory systems: Secondary | ICD-10-CM | POA: Insufficient documentation

## 2015-09-13 DIAGNOSIS — R319 Hematuria, unspecified: Secondary | ICD-10-CM | POA: Insufficient documentation

## 2015-09-13 DIAGNOSIS — R05 Cough: Secondary | ICD-10-CM

## 2015-09-13 DIAGNOSIS — R918 Other nonspecific abnormal finding of lung field: Secondary | ICD-10-CM | POA: Insufficient documentation

## 2015-09-13 DIAGNOSIS — R300 Dysuria: Secondary | ICD-10-CM

## 2015-09-13 HISTORY — DX: Cough, unspecified: R05.9

## 2015-09-13 HISTORY — DX: Wheezing: R06.2

## 2015-09-13 HISTORY — DX: Urinary tract infection, site not specified: N39.0

## 2015-09-13 HISTORY — DX: Hematuria, unspecified: R31.9

## 2015-09-13 LAB — POCT URINALYSIS DIPSTICK
Glucose, UA: NEGATIVE
NITRITE UA: POSITIVE

## 2015-09-13 MED ORDER — CIPROFLOXACIN HCL 500 MG PO TABS: 500.0000 mg | ORAL_TABLET | Freq: Two times a day (BID) | ORAL | Status: DC

## 2015-09-13 NOTE — Patient Instructions (Addendum)
Urinary Tract Infection Urinary tract infections (UTIs) can develop anywhere along your urinary tract. Your urinary tract is your body's drainage system for removing wastes and extra water. Your urinary tract includes two kidneys, two ureters, a bladder, and a urethra. Your kidneys are a pair of bean-shaped organs. Each kidney is about the size of your fist. They are located below your ribs, one on each side of your spine. CAUSES Infections are caused by microbes, which are microscopic organisms, including fungi, viruses, and bacteria. These organisms are so small that they can only be seen through a microscope. Bacteria are the microbes that most commonly cause UTIs. SYMPTOMS  Symptoms of UTIs may vary by age and gender of the patient and by the location of the infection. Symptoms in young women typically include a frequent and intense urge to urinate and a painful, burning feeling in the bladder or urethra during urination. Older women and men are more likely to be tired, shaky, and weak and have muscle aches and abdominal pain. A fever may mean the infection is in your kidneys. Other symptoms of a kidney infection include pain in your back or sides below the ribs, nausea, and vomiting. DIAGNOSIS To diagnose a UTI, your caregiver will ask you about your symptoms. Your caregiver also will ask to provide a urine sample. The urine sample will be tested for bacteria and white blood cells. White blood cells are made by your body to help fight infection. TREATMENT  Typically, UTIs can be treated with medication. Because most UTIs are caused by a bacterial infection, they usually can be treated with the use of antibiotics. The choice of antibiotic and length of treatment depend on your symptoms and the type of bacteria causing your infection. HOME CARE INSTRUCTIONS  If you were prescribed antibiotics, take them exactly as your caregiver instructs you. Finish the medication even if you feel better after you  have only taken some of the medication.  Drink enough water and fluids to keep your urine clear or pale yellow.  Avoid caffeine, tea, and carbonated beverages. They tend to irritate your bladder.  Empty your bladder often. Avoid holding urine for long periods of time.  Empty your bladder before and after sexual intercourse.  After a bowel movement, women should cleanse from front to back. Use each tissue only once. SEEK MEDICAL CARE IF:   You have back pain.  You develop a fever.  Your symptoms do not begin to resolve within 3 days. SEEK IMMEDIATE MEDICAL CARE IF:   You have severe back pain or lower abdominal pain.  You develop chills.  You have nausea or vomiting.  You have continued burning or discomfort with urination. MAKE SURE YOU:   Understand these instructions.  Will watch your condition.  Will get help right away if you are not doing well or get worse. Document Released: 09/12/2005 Document Revised: 06/03/2012 Document Reviewed: 01/11/2012 [facility] Patient Information 2015 [place], [place]. This information is not intended to replace advice given to you by your health care provider. Make sure you discuss any questions you have with your health care provider. Push fluids Will talk when chest xray back  Take cipro Use estrace 2-3 x weekly

## 2015-09-13 NOTE — Progress Notes (Signed)
Subjective:     Patient ID: Jo Parrish, female   DOB: Nov 22, 1926, 81 y.o.   MRN: [id number]  HPI Jo Parrish is a 81 year old white female in complaining of burning with urination, cough and congestion and had sore throat, this all started Saturday night.She said she missed work today, first time in 98 years.Has some nausea too.  Review of Systems Patient denies any headaches, hearing loss, fatigue, blurred vision, shortness of breath, chest pain, abdominal pain, problems with bowel movements, or intercourse. No joint pain or mood swings.See HPI for positives.  Reviewed past medical,surgical, social and family history. Reviewed medications and allergies.     Objective:   Physical Exam BP 130/80 mmHg  Pulse 80  Temp(Src) 98.6 F (37 C)  Ht 5' 2.5" (1.588 m)  Wt 157 lb (71.215 kg)  BMI 28.24 kg/m2 urine dipstick +nitrates,2+ leuks,trace protein,3+blood, Skin warm and dry. Neck: mid line trachea, normal thyroid, good ROM, no lymphadenopathy noted. Lungs: wheezing in left lung. Cardiovascular: regular rate and rhythm.Ears are clear with pearly gray TM,throat red, no pustules, no tenderness over sinuses. Pelvic: external genitalia is normal in appearance no lesions, vagina:atrophic and dry,urethra has no lesions or masses noted, cervix:smooth, uterus: normal size, shape and contour, non tender, no masses felt, adnexa: no masses or tenderness noted. Bladder is non tender and no masses felt. She says she can take cipro and keflex if needs antibiotic.     Assessment:     UTI Hematuria Cough Wheezing Vaginal atrophy Burning with urination    Plan:     UA C&S sent Get chest xray now Rx cipro 500 mg #20 take 1 bid Push fluids Use estrace cream 2-3 x weekly Follow up in 1 week

## 2015-09-13 NOTE — Telephone Encounter (Signed)
Complains of ?UTI  To come in at 4;15

## 2015-09-14 ENCOUNTER — Telehealth: Payer: Self-pay | Admitting: Adult Health

## 2015-09-14 LAB — URINALYSIS, ROUTINE W REFLEX MICROSCOPIC
Bilirubin, UA: NEGATIVE
Glucose, UA: NEGATIVE
Ketones, UA: NEGATIVE
Nitrite, UA: POSITIVE — AB
Specific Gravity, UA: 1.022 (ref 1.005–1.030)
Urobilinogen, Ur: 0.2 mg/dL (ref 0.2–1.0)
pH, UA: 6.5 (ref 5.0–7.5)

## 2015-09-14 LAB — MICROSCOPIC EXAMINATION
Casts: NONE SEEN /lpf
WBC, UA: >30 /hpf — AB (ref 0–?)

## 2015-09-14 NOTE — Telephone Encounter (Signed)
Left message, no pneumonia, bronchitic changes,continue cirpo

## 2015-09-15 ENCOUNTER — Telehealth: Payer: Self-pay | Admitting: Adult Health

## 2015-09-15 NOTE — Telephone Encounter (Signed)
Pt taking cipro, still feels congested, aware culture not back on urine, keep taking cipro

## 2015-09-17 LAB — URINE CULTURE

## 2015-09-19 ENCOUNTER — Telehealth: Payer: Self-pay | Admitting: Adult Health

## 2015-09-19 NOTE — Telephone Encounter (Signed)
Taking cipro and UTI better but still has cough

## 2015-09-20 ENCOUNTER — Ambulatory Visit (INDEPENDENT_AMBULATORY_CARE_PROVIDER_SITE_OTHER): Payer: PPO | Admitting: Adult Health

## 2015-09-20 ENCOUNTER — Telehealth: Payer: Self-pay | Admitting: Adult Health

## 2015-09-20 ENCOUNTER — Encounter: Payer: Self-pay | Admitting: Adult Health

## 2015-09-20 VITALS — BP 140/70 | HR 64 | Temp 98.1°F | Ht 62.5 in | Wt 155.0 lb

## 2015-09-20 DIAGNOSIS — R05 Cough: Secondary | ICD-10-CM | POA: Diagnosis not present

## 2015-09-20 DIAGNOSIS — R059 Cough, unspecified: Secondary | ICD-10-CM

## 2015-09-20 DIAGNOSIS — R062 Wheezing: Secondary | ICD-10-CM | POA: Diagnosis not present

## 2015-09-20 MED ORDER — PREDNISONE 10 MG (21) PO TBPK: 10.0000 mg | ORAL_TABLET | Freq: Every day | ORAL | Status: DC

## 2015-09-20 NOTE — Patient Instructions (Signed)
Will talk in pm

## 2015-09-20 NOTE — Progress Notes (Addendum)
Subjective:     Patient ID: [name], female   DOB: 01/19/1926, 81 y.o.   MRN: [id number]  HPI Latina is a 81 year old white female, who was seen a week ago for cough and wheezing and UTI and placed on cipro.She had chest xray that showed bronchitic changes.No pneumonia or CHF.Has 2 more days of cipro.  Review of Systems Patient denies any headaches, hearing loss, fatigue, blurred vision, shortness of breath, chest pain, abdominal pain, problems with bowel movements, urination, or intercourse. No joint pain or mood swings.See HPI for positives, Reviewed past medical,surgical, social and family history. Reviewed medications and allergies.     Objective:   Physical Exam BP 140/70 mmHg  Pulse 64  Temp(Src) 98.1 F (36.7 C)  Ht 5' 2.5" (1.588 m)  Wt 155 lb (70.308 kg)  BMI 27.88 kg/m2 Skin warm and dry.  Lungs: wheezing on left. Cardiovascular: regular rate and rhythm, will talk with MD this afternoon and will talk then to decide if needs prednisone or inhaler and different antibiotic.     Assessment:     Cough  Wheezing    Plan:     Finish cipro Will talk in pm     I consulted with Dr Luan Pulling and he recommends steroid dose pak, will rx prednisone 10 mg #21 take 6,5,4,3,2,1 and will repeat chest xray in 2 weeks.

## 2015-09-20 NOTE — Telephone Encounter (Signed)
Pt aware to take prednisone dose pak and get F/U chest xray after med finished

## 2015-09-20 NOTE — Addendum Note (Signed)
Addended by: Derrek Monaco A on: 09/20/2015 05:34 PM   Modules accepted: Orders

## 2015-09-26 ENCOUNTER — Other Ambulatory Visit: Payer: Self-pay | Admitting: Adult Health

## 2015-09-26 DIAGNOSIS — J40 Bronchitis, not specified as acute or chronic: Secondary | ICD-10-CM

## 2015-09-27 ENCOUNTER — Telehealth: Payer: Self-pay | Admitting: Adult Health

## 2015-09-27 ENCOUNTER — Ambulatory Visit (HOSPITAL_COMMUNITY)
Admission: RE | Admit: 2015-09-27 | Discharge: 2015-09-27 | Disposition: A | Discharge status: Home / Self Care | Payer: PPO | Source: Ambulatory Visit | Attending: Adult Health | Admitting: Adult Health

## 2015-09-27 DIAGNOSIS — J40 Bronchitis, not specified as acute or chronic: Secondary | ICD-10-CM | POA: Insufficient documentation

## 2015-09-27 NOTE — Telephone Encounter (Signed)
Left message chest xray was clear

## 2015-10-13 ENCOUNTER — Other Ambulatory Visit (HOSPITAL_COMMUNITY): Payer: Self-pay | Admitting: Internal Medicine

## 2015-10-13 ENCOUNTER — Ambulatory Visit (HOSPITAL_COMMUNITY)
Admission: RE | Admit: 2015-10-13 | Discharge: 2015-10-13 | Disposition: A | Discharge status: Home / Self Care | Payer: PPO | Source: Ambulatory Visit | Attending: Internal Medicine | Admitting: Internal Medicine

## 2015-10-13 DIAGNOSIS — J9811 Atelectasis: Secondary | ICD-10-CM | POA: Insufficient documentation

## 2015-10-13 DIAGNOSIS — R918 Other nonspecific abnormal finding of lung field: Secondary | ICD-10-CM | POA: Insufficient documentation

## 2015-10-13 DIAGNOSIS — R0600 Dyspnea, unspecified: Secondary | ICD-10-CM

## 2015-10-13 DIAGNOSIS — R0602 Shortness of breath: Secondary | ICD-10-CM | POA: Insufficient documentation

## 2015-11-15 ENCOUNTER — Encounter: Payer: Self-pay | Admitting: Cardiovascular Disease

## 2015-11-15 ENCOUNTER — Ambulatory Visit (INDEPENDENT_AMBULATORY_CARE_PROVIDER_SITE_OTHER): Payer: PPO | Admitting: Cardiovascular Disease

## 2015-11-15 VITALS — BP 110/70 | HR 70 | Ht 62.0 in | Wt 157.0 lb

## 2015-11-15 DIAGNOSIS — E785 Hyperlipidemia, unspecified: Secondary | ICD-10-CM | POA: Diagnosis not present

## 2015-11-15 DIAGNOSIS — I10 Essential (primary) hypertension: Secondary | ICD-10-CM | POA: Diagnosis not present

## 2015-11-15 NOTE — Patient Instructions (Signed)
Medication Instructions:  Your physician recommends that you continue on your current medications as directed. Please refer to the Current Medication list given to you today.   Labwork: Your physician recommends that you return for lab work in: [place] (lipid/liver) - May 2017 The lab can be found on the FIRST FLOOR of out building in [address]   Testing/Procedures: none  Follow-Up: We request that you follow-up in: 6 months with Tarri Fuller and in 12 months with Dr Andria Rhein will receive a reminder letter in the mail two months in advance. If you don't receive a letter, please call our office to schedule the follow-up appointment.    Any Other Special Instructions Will Be Listed Below (If Applicable).     If you need a refill on your cardiac medications before your next appointment, please call your pharmacy.

## 2015-11-15 NOTE — Progress Notes (Signed)
11/15/2015 Jo Parrish   11/03/1926  [id number]  Primary Physician Glo Herring., MD Primary Cardiologist: Lorretta Harp MD [name]   HPI:   The patient is a delightful 81 year old mildly overweight widowed Caucasian female mother of 1 child who I last saw in the office approximately 68months ago. She does admit to dietary indiscretion and has had lower extremity edema and spikes in blood pressure related to this. Her last 2-D echo revealed an EF of 45-50% with grade 1 diastolic dysfunction. She otherwise denies chest pain or shortness of breath. I did clear her for right total knee replacement with Dr. Moshe Salisbury, and she has since switched surgeons to Dr. Gaynelle Arabian who performed the surgery successfully. She was seen in the emergency room at [facility] on 12/05/14 with some chest pain and blood pressure of 172/94 which she attributes to having had salty green beans earlier that day. She is very precise and quantitative with measuring her blood pressure on a daily basis and taking when necessary amlodipine and atenolol with blood pressures that are "out of range". She has intermittent left bundle branch block. Since she was here 6 months ago she has been asymptomatic. She is very active and dances several times a week.    Current Outpatient Prescriptions  Medication Sig Dispense Refill  . acetaminophen (TYLENOL) 325 MG tablet Take 2 tablets (650 mg total) by mouth every 6 (six) hours as needed. 60 tablet 0  . amLODipine (NORVASC) 5 MG tablet Take 1 tablet (5 mg total) by mouth 2 (two) times daily. 180 tablet 1  . atenolol (TENORMIN) 25 MG tablet Take 25 mg by mouth every morning.     . ciprofloxacin (CIPRO) 500 MG tablet Take 1 tablet (500 mg total) by mouth 2 (two) times daily. 20 tablet 0  . Cyanocobalamin (VITAMIN B-12 IJ) Inject as directed every 30 (thirty) days.    . diazepam (VALIUM) 10 MG tablet 10 mg as needed.     . ezetimibe (ZETIA) 10 MG  tablet Take 1 tablet (10 mg total) by mouth at bedtime. 28 tablet 0  . irbesartan (AVAPRO) 300 MG tablet Take 300 mg by mouth every morning.     Marland Kitchen levothyroxine (SYNTHROID, LEVOTHROID) 75 MCG tablet Take 75 mcg by mouth daily before breakfast.    . omeprazole (PRILOSEC) 40 MG capsule Take 40 mg by mouth every morning.     Marland Kitchen Phenylephrine-APAP-Guaifenesin (TYLENOL SINUS SEVERE PO) Take by mouth as needed.    . polyethylene glycol powder (GLYCOLAX) powder Take 1 Container by mouth daily.    . predniSONE (STERAPRED UNI-PAK 21 TAB) 10 MG (21) TBPK tablet Take 1 tablet (10 mg total) by mouth daily. Take 6 first day, then 5,4,3,2,1 21 tablet 0   No current facility-administered medications for this visit.    Allergies  Allergen Reactions  . Amoxicillin     Mouth sores  . Levaquin [Levofloxacin In D5w]     Mouth sores  . Statins Other (See Comments)    Leg problems  . Sulfa Antibiotics Other (See Comments)    Reaction unknown    Social History   Social History  . Marital Status: Widowed    Spouse Name: N/A  . Number of Children: N/A  . Years of Education: N/A   Occupational History  . Not on file.   Social History Main Topics  . Smoking status: Never Smoker   . Smokeless tobacco: Never Used  . Alcohol Use:  No  . Drug Use: No  . Sexual Activity: Yes    Birth Control/ Protection: Post-menopausal   Other Topics Concern  . Not on file   Social History Narrative     Review of Systems: General: negative for chills, fever, night sweats or weight changes.  Cardiovascular: negative for chest pain, dyspnea on exertion, edema, orthopnea, palpitations, paroxysmal nocturnal dyspnea or shortness of breath Dermatological: negative for rash Respiratory: negative for cough or wheezing Urologic: negative for hematuria Abdominal: negative for nausea, vomiting, diarrhea, bright red blood per rectum, melena, or hematemesis Neurologic: negative for visual changes, syncope, or dizziness All  other systems reviewed and are otherwise negative except as noted above.    Blood pressure 110/70, pulse 70, height 5\' 2"  (1.575 m), weight 157 lb (71.215 kg).  General appearance: alert and no distress Neck: no adenopathy, no carotid bruit, no JVD, supple, symmetrical, trachea midline and thyroid not enlarged, symmetric, no tenderness/mass/nodules Lungs: clear to auscultation bilaterally Heart: regular rate and rhythm, S1, S2 normal, no murmur, click, rub or gallop Extremities: extremities normal, atraumatic, no cyanosis or edema  EKG sinus rhythm at 70 without ST or T-wave changes. I personally reviewed this EKG  ASSESSMENT AND PLAN:   Hyperlipidemia History of hyperlipidemia on Zetia with recent lipid profile performed 06/21/15 revealed a total cholesterol 193, LDL 1:15 and HDL of 51  Essential hypertension History of hypertension with blood pressure measured at 110/70. She is on amlodipine and atenolol as well as Avapro. Continue current meds at current dosing      Lorretta Harp MD [facility], [facility] 11/15/2015 2:08 PM

## 2015-11-15 NOTE — Assessment & Plan Note (Signed)
History of hyperlipidemia on Zetia with recent lipid profile performed 06/21/15 revealed a total cholesterol 193, LDL 1:15 and HDL of 51

## 2015-11-15 NOTE — Assessment & Plan Note (Signed)
History of hypertension with blood pressure measured at 110/70. She is on amlodipine and atenolol as well as Avapro. Continue current meds at current dosing

## 2015-11-23 ENCOUNTER — Ambulatory Visit: Payer: PPO | Admitting: Cardiovascular Disease

## 2015-12-09 ENCOUNTER — Telehealth: Payer: Self-pay | Admitting: Cardiology

## 2015-12-09 NOTE — Telephone Encounter (Signed)
Pt's BP elevated 165/91 it continues to be elevated, she is concerned - instructed to increase atenolol to 25 mg BID - she has taken valium to relax she will call back if BP does not come down.  She will continue to take atenolol twice a day unless BP < [id number] systolic and then she will go back to once a day.  She will call back if further problems.

## 2015-12-10 ENCOUNTER — Emergency Department (HOSPITAL_COMMUNITY)
Admission: EM | Admit: 2015-12-10 | Discharge: 2015-12-10 | Disposition: A | Discharge status: Home / Self Care | Payer: PPO | Attending: Emergency Medicine | Admitting: Emergency Medicine

## 2015-12-10 ENCOUNTER — Encounter (HOSPITAL_COMMUNITY): Payer: Self-pay | Admitting: Emergency Medicine

## 2015-12-10 DIAGNOSIS — E039 Hypothyroidism, unspecified: Secondary | ICD-10-CM | POA: Diagnosis not present

## 2015-12-10 DIAGNOSIS — Z8739 Personal history of other diseases of the musculoskeletal system and connective tissue: Secondary | ICD-10-CM | POA: Diagnosis not present

## 2015-12-10 DIAGNOSIS — K219 Gastro-esophageal reflux disease without esophagitis: Secondary | ICD-10-CM | POA: Insufficient documentation

## 2015-12-10 DIAGNOSIS — Z8669 Personal history of other diseases of the nervous system and sense organs: Secondary | ICD-10-CM | POA: Insufficient documentation

## 2015-12-10 DIAGNOSIS — N760 Acute vaginitis: Secondary | ICD-10-CM | POA: Insufficient documentation

## 2015-12-10 DIAGNOSIS — Z880 Allergy status to penicillin: Secondary | ICD-10-CM | POA: Insufficient documentation

## 2015-12-10 DIAGNOSIS — I10 Essential (primary) hypertension: Secondary | ICD-10-CM | POA: Insufficient documentation

## 2015-12-10 DIAGNOSIS — Z87440 Personal history of urinary (tract) infections: Secondary | ICD-10-CM | POA: Insufficient documentation

## 2015-12-10 DIAGNOSIS — E860 Dehydration: Secondary | ICD-10-CM

## 2015-12-10 DIAGNOSIS — Z79899 Other long term (current) drug therapy: Secondary | ICD-10-CM | POA: Insufficient documentation

## 2015-12-10 DIAGNOSIS — R531 Weakness: Secondary | ICD-10-CM | POA: Diagnosis present

## 2015-12-10 DIAGNOSIS — E785 Hyperlipidemia, unspecified: Secondary | ICD-10-CM | POA: Insufficient documentation

## 2015-12-10 DIAGNOSIS — B9689 Other specified bacterial agents as the cause of diseases classified elsewhere: Secondary | ICD-10-CM

## 2015-12-10 LAB — CBC WITH DIFFERENTIAL/PLATELET
BASOS PCT: 0 %
Basophils Absolute: 0 10*3/uL (ref 0.0–0.1)
EOS ABS: 0.1 10*3/uL (ref 0.0–0.7)
Eosinophils Relative: 2 %
HEMATOCRIT: 39.4 % (ref 36.0–46.0)
HEMOGLOBIN: 13.6 g/dL (ref 12.0–15.0)
LYMPHS ABS: 2.1 10*3/uL (ref 0.7–4.0)
LYMPHS PCT: 37 %
MCH: 33.3 pg (ref 26.0–34.0)
MCHC: 34.5 g/dL (ref 30.0–36.0)
MCV: 96.3 fL (ref 78.0–100.0)
Monocytes Absolute: 0.4 10*3/uL (ref 0.1–1.0)
Monocytes Relative: 7 %
NEUTROS ABS: 3.1 10*3/uL (ref 1.7–7.7)
Neutrophils Relative %: 54 %
Platelets: 205 10*3/uL (ref 150–400)
RBC: 4.09 MIL/uL (ref 3.87–5.11)
RDW: 12.6 % (ref 11.5–15.5)
WBC: 5.7 10*3/uL (ref 4.0–10.5)

## 2015-12-10 LAB — COMPREHENSIVE METABOLIC PANEL
ALBUMIN: 4.2 g/dL (ref 3.5–5.0)
ALK PHOS: 54 U/L (ref 38–126)
ALT: 26 U/L (ref 14–54)
AST: 26 U/L (ref 15–41)
Anion gap: 9 (ref 5–15)
BILIRUBIN TOTAL: 0.6 mg/dL (ref 0.3–1.2)
BUN: 23 mg/dL — AB (ref 6–20)
CALCIUM: 9.3 mg/dL (ref 8.9–10.3)
CO2: 23 mmol/L (ref 22–32)
CREATININE: 1.18 mg/dL — AB (ref 0.44–1.00)
Chloride: 103 mmol/L (ref 101–111)
GFR calc Af Amer: 47 mL/min — ABNORMAL LOW (ref >60)
GFR calc non Af Amer: 40 mL/min — ABNORMAL LOW (ref >60)
Glucose, Bld: 115 mg/dL — ABNORMAL HIGH (ref 65–99)
Potassium: 4.2 mmol/L (ref 3.5–5.1)
SODIUM: 135 mmol/L (ref 135–145)
Total Protein: 7.2 g/dL (ref 6.5–8.1)

## 2015-12-10 LAB — URINALYSIS, ROUTINE W REFLEX MICROSCOPIC
Bilirubin Urine: NEGATIVE
GLUCOSE, UA: NEGATIVE mg/dL
Hgb urine dipstick: NEGATIVE
Ketones, ur: NEGATIVE mg/dL
Leukocytes, UA: NEGATIVE
Nitrite: NEGATIVE
PROTEIN: NEGATIVE mg/dL
pH: 6.5 (ref 5.0–8.0)

## 2015-12-10 LAB — WET PREP, GENITAL
SPERM: NONE SEEN
Trich, Wet Prep: NONE SEEN
Yeast Wet Prep HPF POC: NONE SEEN

## 2015-12-10 LAB — TROPONIN I: Troponin I: <0.03 ng/mL (ref <0.031)

## 2015-12-10 MED ORDER — METRONIDAZOLE 500 MG PO TABS: 500.0000 mg | ORAL_TABLET | Freq: Two times a day (BID) | ORAL | Status: DC

## 2015-12-10 MED ORDER — METRONIDAZOLE 500 MG PO TABS
500.0000 mg | ORAL_TABLET | Freq: Once | ORAL | Status: AC
Start: 2015-12-10 — End: 2015-12-10
  Administered 2015-12-10: 500 mg via ORAL
  Filled 2015-12-10: qty 1

## 2015-12-10 MED ORDER — SODIUM CHLORIDE 0.9 % IV BOLUS (SEPSIS)
500.0000 mL | Freq: Once | INTRAVENOUS | Status: AC
  Administered 2015-12-10: 500 mL via INTRAVENOUS

## 2015-12-10 NOTE — ED Provider Notes (Signed)
CSN: [id number]     Arrival date & time 12/10/15  1939 History   First MD Initiated Contact with Patient 12/10/15 1953     Chief Complaint  Patient presents with  . Weakness     (Consider location/radiation/quality/duration/timing/severity/associated sxs/prior Treatment) HPI Comments: 81 year old female with hypertension, hypothyroidism, acid reflux disease. She presents from home after having approximately 36 hours of intermittent headache, high blood pressure and having some vaginal burning. Today she has had a feeling of generalized fatigue which is noticeable for her yet does not prevent her from doing her daily activities. She denies absolutely any chest pain or difficulty breathing, she has no chest pain and denies chest tightness to me. There is no swelling of the legs, no diarrhea, no blurred vision, no weakness or numbness. Only generalized fatigue. She called her cardiologist yesterday and was told to take an extra dose of her atenolol, she states that that improved her symptoms remarkably. They have since returned  Patient is a 81 y.o. female presenting with weakness. The history is provided by the patient.  Weakness    Past Medical History  Diagnosis Date  . Hyperlipidemia   . Hypertension   . Bacterial overgrowth syndrome     Small bowel  . Sjogren's syndrome ([place])   . Rheumatoid arthritis(714.0)   . Hypothyroidism   . Complication of anesthesia   . PONV (postoperative nausea and vomiting)   . GERD (gastroesophageal reflux disease)   . Ulcer, esophagus   . Constipation   . OSA (obstructive sleep apnea)     UNABLE TO TOLERATE  MASK  . Left bundle branch block   . Urinary frequency 07/04/2015  . Vaginal burning 07/04/2015  . Vaginal dryness 07/04/2015  . Vaginal atrophy 08/02/2015  . Vulvar itching 08/15/2015  . UTI (lower urinary tract infection) 09/13/2015  . Hematuria 09/13/2015  . Cough 09/13/2015  . Wheezing on auscultation 09/13/2015   Past Surgical History   Procedure Laterality Date  . Appendectomy    . Esophagogastroduodenoscopy  06/07/2008     Gastritis. No celiac sprue/Small hiatal hernia  . Hydrogen breath test   05/17/2008    small bowel bacterial overgrowth/Hydrogen level rise consistent with small bowel bacterial  . Tonsillectomy    . Dilation and curettage of uterus    . Total knee arthroplasty Right 10/09/2013    Procedure: RIGHT TOTAL KNEE ARTHROPLASTY;  Surgeon: Gearlean Alf, MD;  Location: [facility];  Service: Orthopedics;  Laterality: Right;  . Esophagogastroduodenoscopy  June 2014    Dr. Earlean Shawl, erosive esophagitis, grade B., large hiatal hernia.  . Colonoscopy  June 2014    Dr. Earlean Shawl, multiple tubular adenomas removed, internal hemorrhoids. Next colonoscopy in 3 years.   Family History  Problem Relation Age of Onset  . Stroke Mother   . Heart disease Father   . Liver disease Father   . COPD Son   . Sleep apnea Son   . Stroke Maternal Grandmother    Social History  Substance Use Topics  . Smoking status: Never Smoker   . Smokeless tobacco: Never Used  . Alcohol Use: No   OB History    Gravida Para Term Preterm AB TAB SAB Ectopic Multiple Living   1         1     Review of Systems  Neurological: Positive for weakness.  All other systems reviewed and are negative.     Allergies  Amoxicillin; Levaquin; Statins; and Sulfa antibiotics  Home Medications  Prior to Admission medications   Medication Sig Start Date End Date Taking? Authorizing Provider  acetaminophen (TYLENOL) 325 MG tablet Take 2 tablets (650 mg total) by mouth every 6 (six) hours as needed. 10/12/13  Yes Arlee Muslim, PA-C  amLODipine (NORVASC) 5 MG tablet Take 1 tablet (5 mg total) by mouth 2 (two) times daily. 10/08/14  Yes Lorretta Harp, MD  atenolol (TENORMIN) 25 MG tablet Take 25 mg by mouth 2 (two) times daily.    Yes Historical Provider, MD  diazepam (VALIUM) 10 MG tablet Take 2.5-5 mg by mouth daily as needed for anxiety.   12/15/13  Yes Historical Provider, MD  ezetimibe (ZETIA) 10 MG tablet Take 1 tablet (10 mg total) by mouth at bedtime. 12/15/14  Yes Lorretta Harp, MD  irbesartan (AVAPRO) 300 MG tablet Take 300 mg by mouth every morning.  07/20/13  Yes Historical Provider, MD  levothyroxine (SYNTHROID, LEVOTHROID) 75 MCG tablet Take 75 mcg by mouth daily before breakfast.   Yes Historical Provider, MD  omeprazole (PRILOSEC) 40 MG capsule Take 40 mg by mouth every morning.  07/04/13  Yes Historical Provider, MD  Phenylephrine-APAP-Guaifenesin (TYLENOL SINUS SEVERE PO) Take by mouth as needed.   Yes Historical Provider, MD  polyethylene glycol powder (GLYCOLAX) powder Take 17 g by mouth daily.    Yes Historical Provider, MD  ciprofloxacin (CIPRO) 500 MG tablet Take 1 tablet (500 mg total) by mouth 2 (two) times daily. Patient not taking: Reported on 12/10/2015 09/13/15   Estill Dooms, NP  Cyanocobalamin (VITAMIN B-12 IJ) Inject as directed every 30 (thirty) days.    Historical Provider, MD  metroNIDAZOLE (FLAGYL) 500 MG tablet Take 1 tablet (500 mg total) by mouth 2 (two) times daily. 12/10/15   Noemi Chapel, MD  predniSONE (STERAPRED UNI-PAK 21 TAB) 10 MG (21) TBPK tablet Take 1 tablet (10 mg total) by mouth daily. Take 6 first day, then 5,4,3,2,1 Patient not taking: Reported on 12/10/2015 09/20/15   Estill Dooms, NP   BP 132/78 mmHg  Pulse 78  Temp(Src) 98.2 F (36.8 C) (Oral)  Resp 14  Ht 5\' 2"  (1.575 m)  Wt 158 lb (71.668 kg)  BMI 28.89 kg/m2  SpO2 98% Physical Exam  Constitutional: She appears well-developed and well-nourished. No distress.  HENT:  Head: Normocephalic and atraumatic.  Mouth/Throat: Oropharynx is clear and moist. No oropharyngeal exudate.  Eyes: Conjunctivae and EOM are normal. Pupils are equal, round, and reactive to light. Right eye exhibits no discharge. Left eye exhibits no discharge. No scleral icterus.  Neck: Normal range of motion. Neck supple. No JVD present. No  thyromegaly present.  Cardiovascular: Normal rate, regular rhythm, normal heart sounds and intact distal pulses.  Exam reveals no gallop and no friction rub.   No murmur heard. Pulmonary/Chest: Effort normal and breath sounds normal. No respiratory distress. She has no wheezes. She has no rales.  Abdominal: Soft. Bowel sounds are normal. She exhibits no distension and no mass. There is no tenderness.  Musculoskeletal: Normal range of motion. She exhibits no edema or tenderness.  Lymphadenopathy:    She has no cervical adenopathy.  Neurological: She is alert. Coordination normal.  Skin: Skin is warm and dry. No rash noted. No erythema.  Psychiatric: She has a normal mood and affect. Her behavior is normal.  Nursing note and vitals reviewed.   ED Course  Procedures (including critical care time) Labs Review Labs Reviewed  WET PREP, GENITAL - Abnormal; Notable for the following:  Clue Cells Wet Prep HPF POC PRESENT (*)    WBC, Wet Prep HPF POC FEW (*)    All other components within normal limits  URINALYSIS, ROUTINE W REFLEX MICROSCOPIC (NOT AT [facility]) - Abnormal; Notable for the following:    Color, Urine STRAW (*)    Specific Gravity, Urine <1.005 (*)    All other components within normal limits  COMPREHENSIVE METABOLIC PANEL - Abnormal; Notable for the following:    Glucose, Bld 115 (*)    BUN 23 (*)    Creatinine, Ser 1.18 (*)    GFR calc non Af Amer 40 (*)    GFR calc Af Amer 47 (*)    All other components within normal limits  URINE CULTURE  CBC WITH DIFFERENTIAL/PLATELET  TROPONIN I    Imaging Review No results found. I have personally reviewed and evaluated these images and lab results as part of my medical decision-making.   EKG Interpretation   Date/Time:  Saturday December 10 2015 19:58:57 EST Ventricular Rate:  71 PR Interval:  187 QRS Duration: 98 QT Interval:  409 QTC Calculation: 444 R Axis:   -13 Text Interpretation:  Sinus rhythm Since last tracing no  LBBB is seen on  today's ECG lafb present Abnormal ekg Left bundle branch block has  resolved Confirmed by Gianny Killman  MD, [place] ([id number]) on 12/10/2015 8:05:52 PM      MDM   Final diagnoses:  Dehydration  Bacterial vaginitis    The patient has normal vital signs other than mild hypertension, there is no tachycardia hypoxia or fever. Urinalysis has been ordered as well as labs to evaluate for the source of her generalized fatigue. She has no reproducible abdominal tenderness or lung findings to suggest focal infection. Vaginal exam to be performed by [facility] nurse practitioner, see separate documentation regarding that exam.  BV on pelvic Slight dehydradtion Vs stable, Otherwise normal labs and w/u  Well appearing  Meds given in ED:  Medications  sodium chloride 0.9 % bolus 500 mL (500 mLs Intravenous New Bag/Given 12/10/15 2129)  metroNIDAZOLE (FLAGYL) tablet 500 mg (500 mg Oral Given 12/10/15 2129)    New Prescriptions   METRONIDAZOLE (FLAGYL) 500 MG TABLET    Take 1 tablet (500 mg total) by mouth 2 (two) times daily.      Noemi Chapel, MD 12/10/15 [phone]

## 2015-12-10 NOTE — ED Notes (Signed)
Pt reports generalized weakness, "chest tightness", and urinary burning. Onset of symptoms Wed.

## 2015-12-10 NOTE — Discharge Instructions (Signed)

## 2015-12-10 NOTE — ED Provider Notes (Signed)
THIS IS A SHARED VISIT WITH DR. Aaron Edelman MILLER  Jo Parrish is a 81 y.o. female who presents to the ED with multiple complaints with one being vaginal dryness. She request a female to do her pelvic exam. Last intercourse about a year ago. She has been using OTC medication for vaginal dryness today.   BP 149/78 mmHg  Pulse 69  Temp(Src) 97.6 F (36.4 C) (Oral)  Resp 19  Ht 5\' 2"  (1.575 m)  Wt 71.668 kg  BMI 28.89 kg/m2  SpO2 96%   Pelvic exam: external genitalia without lesion, there is mild atrophy of the tissue at the introitus. Vaginal vault mucosa is moist, however, the patient has use the lubricant just prior to coming to the ED. No CMT, no adnexal tenderness or mass palpated, uterus not enlarged.   Cultures collected and wet prep.    [place], NP 12/10/15 2100  Noemi Chapel, MD 12/10/15 [phone]

## 2015-12-12 LAB — URINE CULTURE: SPECIAL REQUESTS: NORMAL

## 2015-12-22 DIAGNOSIS — E063 Autoimmune thyroiditis: Secondary | ICD-10-CM | POA: Diagnosis not present

## 2016-01-03 DIAGNOSIS — R5383 Other fatigue: Secondary | ICD-10-CM | POA: Diagnosis not present

## 2016-01-03 DIAGNOSIS — Z6828 Body mass index (BMI) 28.0-28.9, adult: Secondary | ICD-10-CM | POA: Diagnosis not present

## 2016-01-03 DIAGNOSIS — E782 Mixed hyperlipidemia: Secondary | ICD-10-CM | POA: Diagnosis not present

## 2016-01-03 DIAGNOSIS — Z1389 Encounter for screening for other disorder: Secondary | ICD-10-CM | POA: Diagnosis not present

## 2016-01-03 DIAGNOSIS — E063 Autoimmune thyroiditis: Secondary | ICD-10-CM | POA: Diagnosis not present

## 2016-01-03 DIAGNOSIS — E538 Deficiency of other specified B group vitamins: Secondary | ICD-10-CM | POA: Diagnosis not present

## 2016-01-04 ENCOUNTER — Ambulatory Visit: Payer: PPO | Admitting: "Endocrinology

## 2016-01-04 DIAGNOSIS — R5383 Other fatigue: Secondary | ICD-10-CM | POA: Diagnosis not present

## 2016-01-04 DIAGNOSIS — E781 Pure hyperglyceridemia: Secondary | ICD-10-CM | POA: Diagnosis not present

## 2016-01-04 DIAGNOSIS — Z1389 Encounter for screening for other disorder: Secondary | ICD-10-CM | POA: Diagnosis not present

## 2016-01-06 ENCOUNTER — Other Ambulatory Visit (HOSPITAL_COMMUNITY): Payer: Self-pay | Admitting: Internal Medicine

## 2016-01-06 DIAGNOSIS — M159 Polyosteoarthritis, unspecified: Secondary | ICD-10-CM

## 2016-01-10 ENCOUNTER — Ambulatory Visit (HOSPITAL_COMMUNITY)
Admission: RE | Admit: 2016-01-10 | Discharge: 2016-01-10 | Disposition: A | Discharge status: Home / Self Care | Payer: PPO | Source: Ambulatory Visit | Attending: Internal Medicine | Admitting: Internal Medicine

## 2016-01-10 DIAGNOSIS — M159 Polyosteoarthritis, unspecified: Secondary | ICD-10-CM

## 2016-01-10 DIAGNOSIS — Z780 Asymptomatic menopausal state: Secondary | ICD-10-CM | POA: Diagnosis not present

## 2016-01-10 DIAGNOSIS — M8589 Other specified disorders of bone density and structure, multiple sites: Secondary | ICD-10-CM | POA: Insufficient documentation

## 2016-01-12 DIAGNOSIS — D122 Benign neoplasm of ascending colon: Secondary | ICD-10-CM | POA: Diagnosis not present

## 2016-01-12 DIAGNOSIS — K641 Second degree hemorrhoids: Secondary | ICD-10-CM | POA: Diagnosis not present

## 2016-01-12 DIAGNOSIS — D125 Benign neoplasm of sigmoid colon: Secondary | ICD-10-CM | POA: Diagnosis not present

## 2016-01-12 DIAGNOSIS — R195 Other fecal abnormalities: Secondary | ICD-10-CM | POA: Diagnosis not present

## 2016-01-12 DIAGNOSIS — K648 Other hemorrhoids: Secondary | ICD-10-CM | POA: Diagnosis not present

## 2016-01-12 DIAGNOSIS — K625 Hemorrhage of anus and rectum: Secondary | ICD-10-CM | POA: Diagnosis not present

## 2016-01-12 DIAGNOSIS — D124 Benign neoplasm of descending colon: Secondary | ICD-10-CM | POA: Diagnosis not present

## 2016-01-12 DIAGNOSIS — Z86010 Personal history of colonic polyps: Secondary | ICD-10-CM | POA: Diagnosis not present

## 2016-01-12 DIAGNOSIS — D123 Benign neoplasm of transverse colon: Secondary | ICD-10-CM | POA: Diagnosis not present

## 2016-01-12 DIAGNOSIS — K921 Melena: Secondary | ICD-10-CM | POA: Diagnosis not present

## 2016-01-16 ENCOUNTER — Ambulatory Visit (INDEPENDENT_AMBULATORY_CARE_PROVIDER_SITE_OTHER): Payer: PPO | Admitting: Adult Health

## 2016-01-16 ENCOUNTER — Encounter: Payer: Self-pay | Admitting: Adult Health

## 2016-01-16 VITALS — BP 110/70 | HR 72 | Ht 62.0 in | Wt 160.0 lb

## 2016-01-16 DIAGNOSIS — N898 Other specified noninflammatory disorders of vagina: Secondary | ICD-10-CM | POA: Diagnosis not present

## 2016-01-16 DIAGNOSIS — N3945 Continuous leakage: Secondary | ICD-10-CM | POA: Diagnosis not present

## 2016-01-16 DIAGNOSIS — R300 Dysuria: Secondary | ICD-10-CM

## 2016-01-16 DIAGNOSIS — R350 Frequency of micturition: Secondary | ICD-10-CM | POA: Diagnosis not present

## 2016-01-16 DIAGNOSIS — N949 Unspecified condition associated with female genital organs and menstrual cycle: Secondary | ICD-10-CM | POA: Diagnosis not present

## 2016-01-16 DIAGNOSIS — R32 Unspecified urinary incontinence: Secondary | ICD-10-CM | POA: Insufficient documentation

## 2016-01-16 HISTORY — DX: Unspecified urinary incontinence: R32

## 2016-01-16 LAB — POCT URINALYSIS DIPSTICK
Blood, UA: NEGATIVE
GLUCOSE UA: NEGATIVE
LEUKOCYTES UA: NEGATIVE
NITRITE UA: NEGATIVE
Protein, UA: NEGATIVE

## 2016-01-16 MED ORDER — ESTRADIOL 0.1 MG/GM VA CREA: TOPICAL_CREAM | VAGINAL | Status: DC

## 2016-01-16 NOTE — Patient Instructions (Signed)
Use estrace 2-3 x weekly Try desitin and vaseline  Follow up prn

## 2016-01-16 NOTE — Progress Notes (Signed)
Subjective:     Patient ID: Jo Parrish, female   DOB: March 04, 1926, 82 y.o.   MRN: [id number]  HPI Jo Parrish is a 82 year old white female, in complaining of vaginal burning, burning with urination, urinary frequency and leakage and vaginal dryness.   Review of Systems  Patient denies any headaches, hearing loss, fatigue, blurred vision, shortness of breath, chest pain, abdominal pain, problems with bowel movements, or intercourse(not having sex). No joint pain or mood swings.See HPI for positives. Reviewed past medical,surgical, social and family history. Reviewed medications and allergies.     Objective:   Physical Exam  BP 110/70 mmHg  Pulse 72  Ht 5\' 2"  (1.575 m)  Wt 160 lb (72.576 kg)  BMI 29.26 kg/m2   urine negative, Skin warm and dry.Pelvic: external genitalia is normal in appearance no lesions, vagina: atrophic, and dry,urethra has no lesions or masses noted, cervix:atrophic, uterus: normal size, shape and contour, non tender, no masses felt, adnexa: no masses or tenderness noted. Bladder is non tender and no masses felt.  Easily leaks urine.Try estrace cream again and use desitin as barrier against moisture. Face time 15 minutes.  Assessment:     Vaginal burning Burning with urination Urinary frequency  UI Vaginal dryness    Plan:     Use estrace vaginal cream, dime size amount 2-3 x weekly at vaginal opening, 3 tubes given 36 gm lot [id number] exp 9/19 Use aquaphor or desitin and vaseline to external tissues Follow up prn

## 2016-01-18 ENCOUNTER — Telehealth: Payer: Self-pay | Admitting: *Deleted

## 2016-01-18 NOTE — Telephone Encounter (Signed)
She used estrace this morning about 8:30 then about 1pm felt faint and left side hurt, I don't feel it is related but do follow up with PCP

## 2016-01-21 DIAGNOSIS — R5383 Other fatigue: Secondary | ICD-10-CM | POA: Diagnosis not present

## 2016-01-25 DIAGNOSIS — K641 Second degree hemorrhoids: Secondary | ICD-10-CM | POA: Diagnosis not present

## 2016-01-30 DIAGNOSIS — Z6828 Body mass index (BMI) 28.0-28.9, adult: Secondary | ICD-10-CM | POA: Diagnosis not present

## 2016-01-30 DIAGNOSIS — E538 Deficiency of other specified B group vitamins: Secondary | ICD-10-CM | POA: Diagnosis not present

## 2016-01-30 DIAGNOSIS — Z0000 Encounter for general adult medical examination without abnormal findings: Secondary | ICD-10-CM | POA: Diagnosis not present

## 2016-02-09 ENCOUNTER — Telehealth: Payer: Self-pay | Admitting: Cardiovascular Disease

## 2016-02-09 NOTE — Telephone Encounter (Signed)
New message  Pt c/o of Chest Pain: 1. Are you having CP right now? No. But she did last night more chest tightness  No pain 2. Are you experiencing any other symptoms (ex. SOB, nausea, vomiting, sweating)? Sob and slight nausea  3. How long have you been experiencing CP? Chest tightness about 10 days but it got worse last night 4. Is your CP continuous or coming and going? Tightness was for the first time last night  5. Have you taken Nitroglycerin? No   Pt c/o Shortness Of Breath: STAT if SOB developed within the last 24 hours or pt is noticeably SOB on the phone  1. Are you currently SOB (can you hear that pt is SOB on the phone)? Yes .Marland Kitchen She can get around but it feels better when she is sitting around doing nothing.  2. How long have you been experiencing SOB? For a  Couple of months. But it is getting worse daily  3. Are you SOB when sitting or when up moving around? Moving around 4.  Are you currently experiencing any other symptoms? Fatigue  Comments: primary care checked for oxygen in the blood. She is supposed to stop and get a testing on 02/10/2016. Request a call back to discuss.

## 2016-02-09 NOTE — Telephone Encounter (Signed)
Spoke with pt, she had an episode of chest tightness last night. She also reports increased SOB. She is having testing tomorrow per the medical doctor because they found her oxygen was low in her blood. She did have some swelling in her feet last night, but she took a pill and the swelling is gone today. She wanted to be seen but then decides she would wait and talk to the medical doctor because the oxygen low in her blood maybe the cause of her SOB and fatigue. appt was made for Monday but the pt decided to cancel and call back if needed. Patient voiced understanding to call 911 if she has a change in her symptoms and gets concerned.

## 2016-02-10 DIAGNOSIS — G4730 Sleep apnea, unspecified: Secondary | ICD-10-CM | POA: Diagnosis not present

## 2016-02-13 ENCOUNTER — Encounter (HOSPITAL_COMMUNITY)
Admission: RE | Admit: 2016-02-13 | Discharge: 2016-02-13 | Disposition: A | Discharge status: Home / Self Care | Payer: PPO | Source: Ambulatory Visit | Attending: Family Medicine | Admitting: Family Medicine

## 2016-02-13 ENCOUNTER — Ambulatory Visit: Payer: PPO | Admitting: Physician Assistant

## 2016-02-13 DIAGNOSIS — M810 Age-related osteoporosis without current pathological fracture: Secondary | ICD-10-CM | POA: Insufficient documentation

## 2016-02-13 MED ORDER — ZOLEDRONIC ACID 5 MG/100ML IV SOLN
5.0000 mg | Freq: Once | INTRAVENOUS | Status: AC
  Administered 2016-02-13: 5 mg via INTRAVENOUS
  Filled 2016-02-13: qty 100

## 2016-02-13 MED ORDER — SODIUM CHLORIDE 0.9 % IV SOLN
Freq: Once | INTRAVENOUS | Status: AC
  Administered 2016-02-13: 14:00:00 via INTRAVENOUS

## 2016-02-15 DIAGNOSIS — K641 Second degree hemorrhoids: Secondary | ICD-10-CM | POA: Diagnosis not present

## 2016-02-23 DIAGNOSIS — G4730 Sleep apnea, unspecified: Secondary | ICD-10-CM | POA: Diagnosis not present

## 2016-02-27 ENCOUNTER — Emergency Department (HOSPITAL_COMMUNITY): Payer: PPO

## 2016-02-27 ENCOUNTER — Encounter (HOSPITAL_COMMUNITY): Payer: Self-pay | Admitting: Emergency Medicine

## 2016-02-27 ENCOUNTER — Emergency Department (HOSPITAL_COMMUNITY)
Admission: EM | Admit: 2016-02-27 | Discharge: 2016-02-27 | Disposition: A | Discharge status: Home / Self Care | Payer: PPO | Attending: Emergency Medicine | Admitting: Emergency Medicine

## 2016-02-27 DIAGNOSIS — R05 Cough: Secondary | ICD-10-CM | POA: Insufficient documentation

## 2016-02-27 DIAGNOSIS — R112 Nausea with vomiting, unspecified: Secondary | ICD-10-CM | POA: Insufficient documentation

## 2016-02-27 DIAGNOSIS — E039 Hypothyroidism, unspecified: Secondary | ICD-10-CM | POA: Insufficient documentation

## 2016-02-27 DIAGNOSIS — R0602 Shortness of breath: Secondary | ICD-10-CM | POA: Diagnosis not present

## 2016-02-27 DIAGNOSIS — Z79899 Other long term (current) drug therapy: Secondary | ICD-10-CM | POA: Diagnosis not present

## 2016-02-27 DIAGNOSIS — R000 Tachycardia, unspecified: Secondary | ICD-10-CM | POA: Insufficient documentation

## 2016-02-27 DIAGNOSIS — R509 Fever, unspecified: Secondary | ICD-10-CM | POA: Diagnosis not present

## 2016-02-27 DIAGNOSIS — E663 Overweight: Secondary | ICD-10-CM | POA: Diagnosis not present

## 2016-02-27 DIAGNOSIS — I10 Essential (primary) hypertension: Secondary | ICD-10-CM | POA: Diagnosis not present

## 2016-02-27 DIAGNOSIS — K5900 Constipation, unspecified: Secondary | ICD-10-CM | POA: Insufficient documentation

## 2016-02-27 DIAGNOSIS — K219 Gastro-esophageal reflux disease without esophagitis: Secondary | ICD-10-CM | POA: Diagnosis not present

## 2016-02-27 DIAGNOSIS — Z6827 Body mass index (BMI) 27.0-27.9, adult: Secondary | ICD-10-CM | POA: Diagnosis not present

## 2016-02-27 DIAGNOSIS — E785 Hyperlipidemia, unspecified: Secondary | ICD-10-CM | POA: Insufficient documentation

## 2016-02-27 DIAGNOSIS — Z1389 Encounter for screening for other disorder: Secondary | ICD-10-CM | POA: Diagnosis not present

## 2016-02-27 DIAGNOSIS — K5669 Other intestinal obstruction: Secondary | ICD-10-CM | POA: Diagnosis not present

## 2016-02-27 DIAGNOSIS — E063 Autoimmune thyroiditis: Secondary | ICD-10-CM | POA: Diagnosis not present

## 2016-02-27 DIAGNOSIS — R1013 Epigastric pain: Secondary | ICD-10-CM | POA: Diagnosis not present

## 2016-02-27 DIAGNOSIS — K449 Diaphragmatic hernia without obstruction or gangrene: Secondary | ICD-10-CM | POA: Diagnosis not present

## 2016-02-27 LAB — COMPREHENSIVE METABOLIC PANEL
ALBUMIN: 3.6 g/dL (ref 3.5–5.0)
ALK PHOS: 57 U/L (ref 38–126)
ALT: 26 U/L (ref 14–54)
ANION GAP: 5 (ref 5–15)
AST: 22 U/L (ref 15–41)
BUN: 11 mg/dL (ref 6–20)
CALCIUM: 8.2 mg/dL — AB (ref 8.9–10.3)
CHLORIDE: 104 mmol/L (ref 101–111)
CO2: 26 mmol/L (ref 22–32)
Creatinine, Ser: 0.77 mg/dL (ref 0.44–1.00)
GFR calc Af Amer: >60 mL/min (ref >60)
GFR calc non Af Amer: >60 mL/min (ref >60)
GLUCOSE: 111 mg/dL — AB (ref 65–99)
POTASSIUM: 3.9 mmol/L (ref 3.5–5.1)
SODIUM: 135 mmol/L (ref 135–145)
TOTAL PROTEIN: 6.8 g/dL (ref 6.5–8.1)
Total Bilirubin: 0.4 mg/dL (ref 0.3–1.2)

## 2016-02-27 LAB — CBC WITH DIFFERENTIAL/PLATELET
BASOS ABS: 0 10*3/uL (ref 0.0–0.1)
BASOS PCT: 0 %
EOS PCT: 0 %
Eosinophils Absolute: 0 10*3/uL (ref 0.0–0.7)
HCT: 37.8 % (ref 36.0–46.0)
Hemoglobin: 12.7 g/dL (ref 12.0–15.0)
LYMPHS PCT: 16 %
Lymphs Abs: 0.9 10*3/uL (ref 0.7–4.0)
MCH: 32.2 pg (ref 26.0–34.0)
MCHC: 33.6 g/dL (ref 30.0–36.0)
MCV: 95.9 fL (ref 78.0–100.0)
Monocytes Absolute: 0.4 10*3/uL (ref 0.1–1.0)
Monocytes Relative: 8 %
NEUTROS ABS: 4.3 10*3/uL (ref 1.7–7.7)
Neutrophils Relative %: 76 %
PLATELETS: 211 10*3/uL (ref 150–400)
RBC: 3.94 MIL/uL (ref 3.87–5.11)
RDW: 12.1 % (ref 11.5–15.5)
WBC: 5.7 10*3/uL (ref 4.0–10.5)

## 2016-02-27 LAB — URINALYSIS, ROUTINE W REFLEX MICROSCOPIC
Bilirubin Urine: NEGATIVE
Glucose, UA: NEGATIVE mg/dL
Hgb urine dipstick: NEGATIVE
KETONES UR: NEGATIVE mg/dL
LEUKOCYTES UA: NEGATIVE
NITRITE: NEGATIVE
PH: 7 (ref 5.0–8.0)
Protein, ur: NEGATIVE mg/dL
SPECIFIC GRAVITY, URINE: 1.01 (ref 1.005–1.030)

## 2016-02-27 LAB — LIPASE, BLOOD: Lipase: 25 U/L (ref 11–51)

## 2016-02-27 LAB — I-STAT CG4 LACTIC ACID, ED
LACTIC ACID, VENOUS: 1.15 mmol/L (ref 0.5–2.0)
LACTIC ACID, VENOUS: 2.56 mmol/L — AB (ref 0.5–2.0)

## 2016-02-27 MED ORDER — SODIUM CHLORIDE 0.9 % IV BOLUS (SEPSIS)
1000.0000 mL | Freq: Once | INTRAVENOUS | Status: AC
  Administered 2016-02-27: 1000 mL via INTRAVENOUS

## 2016-02-27 MED ORDER — IOHEXOL 300 MG/ML  SOLN
100.0000 mL | Freq: Once | INTRAMUSCULAR | Status: AC | PRN
  Administered 2016-02-27: 100 mL via INTRAVENOUS

## 2016-02-27 NOTE — ED Provider Notes (Signed)
CSN: [id number]     Arrival date & time 02/27/16  0855 History   First MD Initiated Contact with Patient 02/27/16 [phone]     Chief Complaint  Patient presents with  . Abdominal Pain   HPI Comments: 82 year old female presents from her PCP's office (Dr. Gerarda Fraction) for nausea and dehydration. She reports she has had issues with nausea for 2 years but has gotten worse in the past 2 weeks. Reports associated fever, chills, cough, decreased appetite. She presented to her PCP this morning who advised her to come to the ED for fluid replacement and for CT of abdomen to r/o a SBO. Denies abdominal pain, constipation, diarrhea, recent abdominal surgery. Able to pass flatus. Last meal was 12am last night and has been taking Tylenol for fevers. Last colonoscopy was in Jan when several polyps were removed.   The history is provided by the patient.    Past Medical History  Diagnosis Date  . Hyperlipidemia   . Hypertension   . Bacterial overgrowth syndrome     Small bowel  . Sjogren's syndrome ([place])   . Rheumatoid arthritis(714.0)   . Hypothyroidism   . Complication of anesthesia   . PONV (postoperative nausea and vomiting)   . GERD (gastroesophageal reflux disease)   . Ulcer, esophagus   . Constipation   . OSA (obstructive sleep apnea)     UNABLE TO TOLERATE  MASK  . Left bundle branch block   . Urinary frequency 07/04/2015  . Vaginal burning 07/04/2015  . Vaginal dryness 07/04/2015  . Vaginal atrophy 08/02/2015  . Vulvar itching 08/15/2015  . UTI (lower urinary tract infection) 09/13/2015  . Hematuria 09/13/2015  . Cough 09/13/2015  . Wheezing on auscultation 09/13/2015  . UI (urinary incontinence) 01/16/2016   Past Surgical History  Procedure Laterality Date  . Appendectomy    . Esophagogastroduodenoscopy  06/07/2008     Gastritis. No celiac sprue/Small hiatal hernia  . Hydrogen breath test   05/17/2008    small bowel bacterial overgrowth/Hydrogen level rise consistent with small bowel bacterial   . Tonsillectomy    . Dilation and curettage of uterus    . Total knee arthroplasty Right 10/09/2013    Procedure: RIGHT TOTAL KNEE ARTHROPLASTY;  Surgeon: Gearlean Alf, MD;  Location: [facility];  Service: Orthopedics;  Laterality: Right;  . Esophagogastroduodenoscopy  June 2014    Dr. Earlean Shawl, erosive esophagitis, grade B., large hiatal hernia.  . Colonoscopy  June 2014    Dr. Earlean Shawl, multiple tubular adenomas removed, internal hemorrhoids. Next colonoscopy in 3 years.   Family History  Problem Relation Age of Onset  . Stroke Mother   . Heart disease Father   . Liver disease Father   . COPD Son   . Sleep apnea Son   . Stroke Maternal Grandmother    Social History  Substance Use Topics  . Smoking status: Never Smoker   . Smokeless tobacco: Never Used  . Alcohol Use: No   OB History    Gravida Para Term Preterm AB TAB SAB Ectopic Multiple Living   1         1     Review of Systems  Constitutional: Positive for fever, chills and appetite change.  HENT:       Reports upper respiratory infection a week ago  Respiratory: Positive for cough. Negative for shortness of breath.   Cardiovascular: Negative for chest pain.  Gastrointestinal: Positive for nausea and vomiting. Negative for abdominal pain, diarrhea and  constipation.  Genitourinary: Negative for dysuria and difficulty urinating.  Musculoskeletal: Negative for gait problem.  Skin: Negative for color change.  Neurological: Negative for syncope and light-headedness.  Psychiatric/Behavioral: Negative for sleep disturbance.    Allergies  Amoxicillin; Levaquin; Statins; and Sulfa antibiotics  Home Medications   Prior to Admission medications   Medication Sig Start Date End Date Taking? Authorizing Provider  acetaminophen (TYLENOL) 325 MG tablet Take 2 tablets (650 mg total) by mouth every 6 (six) hours as needed. 10/12/13   Arlee Muslim, PA-C  amLODipine (NORVASC) 5 MG tablet Take 1 tablet (5 mg total) by mouth 2 (two)  times daily. 10/08/14   Lorretta Harp, MD  atenolol (TENORMIN) 25 MG tablet Take 25 mg by mouth daily.     Historical Provider, MD  cholecalciferol (VITAMIN D) 1000 units tablet Take 1,000 Units by mouth daily.    Historical Provider, MD  Cyanocobalamin (VITAMIN B-12) 5000 MCG SUBL Place under the tongue daily.    Historical Provider, MD  diazepam (VALIUM) 10 MG tablet Take 2.5-5 mg by mouth daily as needed for anxiety.  12/15/13   Historical Provider, MD  estradiol (ESTRACE VAGINAL) 0.1 MG/GM vaginal cream Use estrace at vaginal opening 2-3 x a week 01/16/16   Estill Dooms, NP  ezetimibe (ZETIA) 10 MG tablet Take 1 tablet (10 mg total) by mouth at bedtime. 12/15/14   Lorretta Harp, MD  irbesartan (AVAPRO) 300 MG tablet Take 300 mg by mouth every morning.  07/20/13   Historical Provider, MD  levothyroxine (SYNTHROID, LEVOTHROID) 75 MCG tablet Take 75 mcg by mouth daily before breakfast.    Historical Provider, MD  omeprazole (PRILOSEC) 40 MG capsule Take 40 mg by mouth every morning.  07/04/13   Historical Provider, MD  polyethylene glycol powder (GLYCOLAX) powder Take 17 g by mouth daily.     Historical Provider, MD   BP 155/85 mmHg  Pulse 106  Temp(Src) 99.2 F (37.3 C)  Resp 18  Ht 5\' 2"  (1.575 m)  Wt 71.215 kg  BMI 28.71 kg/m2  SpO2 96%   Physical Exam  Constitutional: She is oriented to person, place, and time. She appears well-developed and well-nourished. No distress.  HENT:  Head: Normocephalic and atraumatic.  Eyes: Pupils are equal, round, and reactive to light.  Cardiovascular: Regular rhythm.  Tachycardia present.  Exam reveals no gallop and no friction rub.   No murmur heard. Pulmonary/Chest: Effort normal and breath sounds normal. No respiratory distress. She has no wheezes. She has no rales.  Abdominal: Soft. Bowel sounds are normal. She exhibits no distension and no mass. There is tenderness. There is no rebound and no guarding.  Suprapubic and epigastric  tenderness  Neurological: She is alert and oriented to person, place, and time.  Skin: Skin is warm and dry.  Psychiatric: She has a normal mood and affect.    ED Course  Procedures (including critical care time) Labs Review Labs Reviewed  COMPREHENSIVE METABOLIC PANEL - Abnormal; Notable for the following:    Glucose, Bld 111 (*)    Calcium 8.2 (*)    All other components within normal limits  I-STAT CG4 LACTIC ACID, ED - Abnormal; Notable for the following:    Lactic Acid, Venous 2.56 (*)    All other components within normal limits  URINE CULTURE  URINALYSIS, ROUTINE W REFLEX MICROSCOPIC (NOT AT [facility])  CBC WITH DIFFERENTIAL/PLATELET  LIPASE, BLOOD  I-STAT CG4 LACTIC ACID, ED   Imaging Review Dg Chest 2  View  02/27/2016  CLINICAL DATA:  Five day history of cough; three-day history of fever with shortness of breath EXAM: CHEST  2 VIEW COMPARISON:  October 13, 2015 FINDINGS: There is borderline hyperexpansion, stable. No edema or consolidation. Heart size and pulmonary vascularity are normal. No adenopathy. There is mid thoracic levoscoliosis. IMPRESSION: Borderline hyperexpansion without edema or consolidation. Electronically Signed   By: Lowella Grip III M.D.   On: 02/27/2016 09:54   Ct Abdomen Pelvis W Contrast  02/27/2016  CLINICAL DATA:  Nausea for 2 weeks with cough, history of appendectomy EXAM: CT ABDOMEN AND PELVIS WITH CONTRAST TECHNIQUE: Multidetector CT imaging of the abdomen and pelvis was performed using the standard protocol following bolus administration of intravenous contrast. CONTRAST:  14mL OMNIPAQUE IOHEXOL 300 MG/ML  SOLN COMPARISON:  04/21/2015 FINDINGS: Lower chest:  Small hiatal hernia.  Lung bases are clear. Hepatobiliary: Normal Pancreas: Normal Spleen: Normal Adrenals/Urinary Tract: Normal Stomach/Bowel: Small hiatal hernia involving the stomach as described above. Nonobstructive bowel gas pattern. Appendix is surgically absent. There is stool throughout  the large bowel. Stool distends the rectum to 6 cm. Vascular/Lymphatic: No significant abdominal or pelvic adenopathy. Moderate atherosclerotic aortoiliac calcification. Reproductive: Negative Other: There is no ascites. Musculoskeletal: No acute musculoskeletal findings. IMPRESSION: No acute abnormalities. Small hiatal hernia. Constipation with mild fecal impaction in the rectum. Electronically Signed   By: Skipper Cliche M.D.   On: 02/27/2016 12:42   I have personally reviewed and evaluated these images and lab results as part of my medical decision-making.   EKG Interpretation   Date/Time:  Monday February 27 2016 09:09:11 EDT Ventricular Rate:  105 PR Interval:  154 QRS Duration: 94 QT Interval:  303 QTC Calculation: 400 R Axis:   49 Text Interpretation:  Sinus tachycardia Borderline repolarization  abnormality Baseline wander in lead(s) V3 Since last tracing rate faster  Confirmed by MILLER  MD, BRIAN ([id number]) on 02/27/2016 9:54:01 AM       MDM   Final diagnoses:  Constipation, unspecified constipation type   82 year old female who was sent by her PCP to r/o an SBO and treat her dehydration. Pt received 1 L of NS in the ED. CT of abdomen was negative for SBO however showed constipation. Pt is already taking Miralax therefore recommended to double her dose. Also advised to take Tylenol for low grade temp as needed and to follow up with Dr. Gerarda Fraction about her CT results. Patient informed of clinical course, understands medical decision-making process, and agrees with plan.  Recardo Evangelist, PA-C 02/27/16 [place], PA-C 02/28/16 [place], MD 03/02/16 [phone]

## 2016-02-27 NOTE — ED Notes (Signed)
Pt c/o upper abd pain/n x 2 weeks. lbm yesterday. Pt sent by pcp-[facility] for evaluation.

## 2016-02-27 NOTE — ED Notes (Signed)
Accidentally hit completed for CT but pt has not gone.  CT informed.

## 2016-02-27 NOTE — ED Provider Notes (Signed)
The patient is an 82 year old female, she has no prior history of abdominal surgery though according to her family doctor she does have a history of a prior small bowel obstruction. The patient reports that she underwent colonoscopy within the last 2 months because of a history of polyps, they found 5 polyps and she states they were "the bed kind". She reports that she's been having some intermittent nausea but has been getting worse over the last 24 hours and she presented to her family doctor's office this morning with epigastric pain, according to her family doctor she was sent to the emergency department for rule out of small bowel obstruction. On exam the patient has a soft abdomen, she does have diffuse tympanitic sounds to percussion but has normal bowel sounds and has had a bowel movement as recently as yesterday, she has had flatus this morning. At this time she is not nauseated nor does she have any abdominal pain. Cardiac and pulmonary exams are normal except for mild tachycardia. Labs and CT scan of the ordered, chest x-ray ordered due to the patient's upper respiratory illness. This chest x-ray is negative. She reports that she was exposed to her son who she had at the hospital for over 8 hours last week who was diagnosed with an influenza-like illness.  Medical screening examination/treatment/procedure(s) were conducted as a shared visit with non-physician practitioner(s) and myself.  I personally evaluated the patient during the encounter.  Clinical Impression:   Final diagnoses:  Constipation, unspecified constipation type         Noemi Chapel, MD 03/02/16 [phone]

## 2016-02-27 NOTE — ED Notes (Signed)
MD at bedside. 

## 2016-02-27 NOTE — Discharge Instructions (Signed)

## 2016-02-28 LAB — URINE CULTURE: Culture: NO GROWTH

## 2016-03-09 DIAGNOSIS — M791 Myalgia: Secondary | ICD-10-CM | POA: Diagnosis not present

## 2016-03-09 DIAGNOSIS — M542 Cervicalgia: Secondary | ICD-10-CM | POA: Diagnosis not present

## 2016-03-09 DIAGNOSIS — M5441 Lumbago with sciatica, right side: Secondary | ICD-10-CM | POA: Diagnosis not present

## 2016-03-09 DIAGNOSIS — M5442 Lumbago with sciatica, left side: Secondary | ICD-10-CM | POA: Diagnosis not present

## 2016-03-12 DIAGNOSIS — G4733 Obstructive sleep apnea (adult) (pediatric): Secondary | ICD-10-CM | POA: Diagnosis not present

## 2016-03-14 DIAGNOSIS — K641 Second degree hemorrhoids: Secondary | ICD-10-CM | POA: Diagnosis not present

## 2016-03-19 DIAGNOSIS — G4730 Sleep apnea, unspecified: Secondary | ICD-10-CM | POA: Diagnosis not present

## 2016-03-19 DIAGNOSIS — Z1389 Encounter for screening for other disorder: Secondary | ICD-10-CM | POA: Diagnosis not present

## 2016-03-19 DIAGNOSIS — Z6827 Body mass index (BMI) 27.0-27.9, adult: Secondary | ICD-10-CM | POA: Diagnosis not present

## 2016-03-19 DIAGNOSIS — I10 Essential (primary) hypertension: Secondary | ICD-10-CM | POA: Diagnosis not present

## 2016-03-19 DIAGNOSIS — E063 Autoimmune thyroiditis: Secondary | ICD-10-CM | POA: Diagnosis not present

## 2016-03-19 DIAGNOSIS — R109 Unspecified abdominal pain: Secondary | ICD-10-CM | POA: Diagnosis not present

## 2016-03-19 DIAGNOSIS — E663 Overweight: Secondary | ICD-10-CM | POA: Diagnosis not present

## 2016-03-27 DIAGNOSIS — E538 Deficiency of other specified B group vitamins: Secondary | ICD-10-CM | POA: Diagnosis not present

## 2016-04-09 DIAGNOSIS — M5136 Other intervertebral disc degeneration, lumbar region: Secondary | ICD-10-CM | POA: Diagnosis not present

## 2016-04-09 DIAGNOSIS — M5441 Lumbago with sciatica, right side: Secondary | ICD-10-CM | POA: Diagnosis not present

## 2016-04-09 DIAGNOSIS — M791 Myalgia: Secondary | ICD-10-CM | POA: Diagnosis not present

## 2016-04-09 DIAGNOSIS — M5442 Lumbago with sciatica, left side: Secondary | ICD-10-CM | POA: Diagnosis not present

## 2016-04-09 DIAGNOSIS — G8929 Other chronic pain: Secondary | ICD-10-CM | POA: Diagnosis not present

## 2016-04-12 DIAGNOSIS — G4733 Obstructive sleep apnea (adult) (pediatric): Secondary | ICD-10-CM | POA: Diagnosis not present

## 2016-04-16 DIAGNOSIS — G4730 Sleep apnea, unspecified: Secondary | ICD-10-CM | POA: Diagnosis not present

## 2016-04-16 DIAGNOSIS — J9801 Acute bronchospasm: Secondary | ICD-10-CM | POA: Diagnosis not present

## 2016-04-16 DIAGNOSIS — Z6827 Body mass index (BMI) 27.0-27.9, adult: Secondary | ICD-10-CM | POA: Diagnosis not present

## 2016-04-16 DIAGNOSIS — Z1389 Encounter for screening for other disorder: Secondary | ICD-10-CM | POA: Diagnosis not present

## 2016-04-16 DIAGNOSIS — J019 Acute sinusitis, unspecified: Secondary | ICD-10-CM | POA: Diagnosis not present

## 2016-04-16 DIAGNOSIS — J209 Acute bronchitis, unspecified: Secondary | ICD-10-CM | POA: Diagnosis not present

## 2016-04-16 DIAGNOSIS — K219 Gastro-esophageal reflux disease without esophagitis: Secondary | ICD-10-CM | POA: Diagnosis not present

## 2016-04-16 DIAGNOSIS — E782 Mixed hyperlipidemia: Secondary | ICD-10-CM | POA: Diagnosis not present

## 2016-04-16 DIAGNOSIS — E663 Overweight: Secondary | ICD-10-CM | POA: Diagnosis not present

## 2016-04-16 DIAGNOSIS — E063 Autoimmune thyroiditis: Secondary | ICD-10-CM | POA: Diagnosis not present

## 2016-04-23 DIAGNOSIS — M5136 Other intervertebral disc degeneration, lumbar region: Secondary | ICD-10-CM | POA: Diagnosis not present

## 2016-04-23 DIAGNOSIS — M5442 Lumbago with sciatica, left side: Secondary | ICD-10-CM | POA: Diagnosis not present

## 2016-04-23 DIAGNOSIS — M791 Myalgia: Secondary | ICD-10-CM | POA: Diagnosis not present

## 2016-04-23 DIAGNOSIS — M5441 Lumbago with sciatica, right side: Secondary | ICD-10-CM | POA: Diagnosis not present

## 2016-04-23 DIAGNOSIS — G8929 Other chronic pain: Secondary | ICD-10-CM | POA: Diagnosis not present

## 2016-04-24 DIAGNOSIS — E782 Mixed hyperlipidemia: Secondary | ICD-10-CM | POA: Diagnosis not present

## 2016-04-24 DIAGNOSIS — Z1389 Encounter for screening for other disorder: Secondary | ICD-10-CM | POA: Diagnosis not present

## 2016-04-24 DIAGNOSIS — E785 Hyperlipidemia, unspecified: Secondary | ICD-10-CM | POA: Diagnosis not present

## 2016-05-07 DIAGNOSIS — D519 Vitamin B12 deficiency anemia, unspecified: Secondary | ICD-10-CM | POA: Diagnosis not present

## 2016-05-13 IMAGING — CT CT ABD-PELV W/ CM
2 of 5 series · 16 of 46 positions shown, 18 images · IV contrast (Omnipaque 300)
Comparison: CT of the abdomen and pelvis June 29, 2008

CLINICAL DATA: Intermittent abdominal pain and nausea for 2 weeks
radiating to upper abdomen. Status post appendectomy. Last bowel
movement yesterday.

EXAM:
CT ABDOMEN AND PELVIS WITH CONTRAST
TECHNIQUE: Multidetector CT imaging of the abdomen and pelvis was performed
using the standard protocol following bolus administration of
intravenous contrast.
CONTRAST:  50mL OMNIPAQUE IOHEXOL 300 MG/ML SOLN, 100mL OMNIPAQUE
IOHEXOL 300 MG/ML SOLN

[Series 2: abd_pel_with 5.0 b40f · axial · 0.70mm/px · z∈[-511,-116]mm · 13 of 91 slices shown, 15 images]
[im 6/91  soft-tissue]
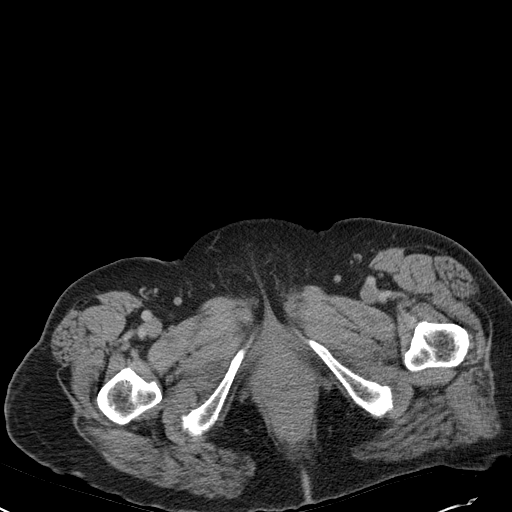
[im 6/91  bone]
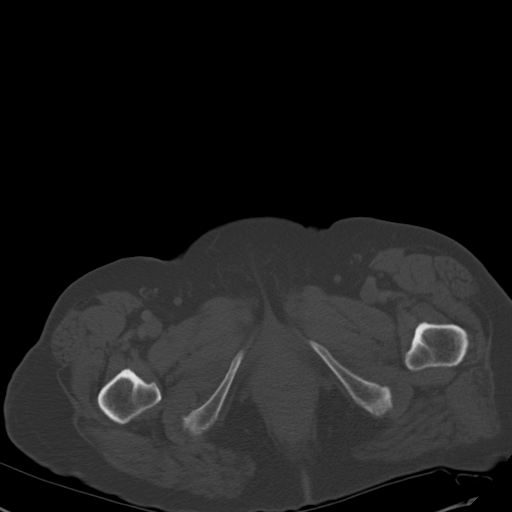
[im 11/91  soft-tissue]
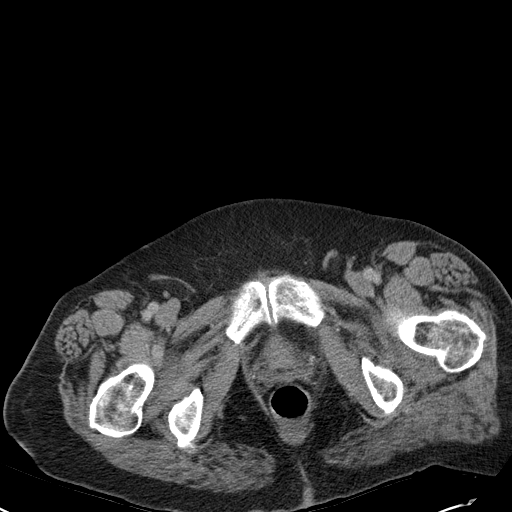
[im 22/91  soft-tissue]
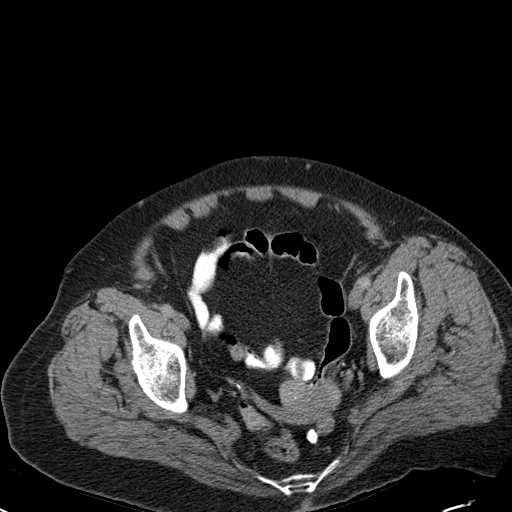
[im 27/91  soft-tissue]
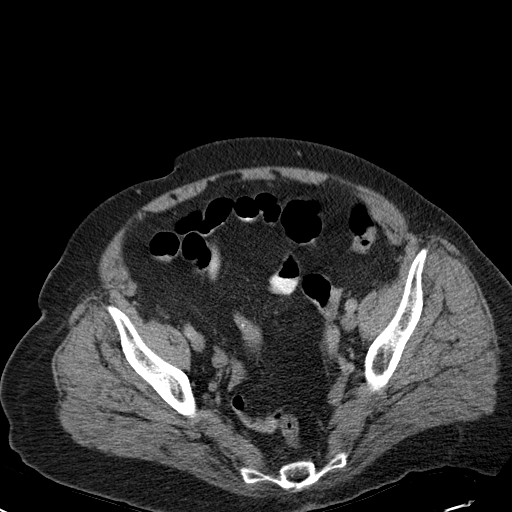
[im 32/91  soft-tissue]
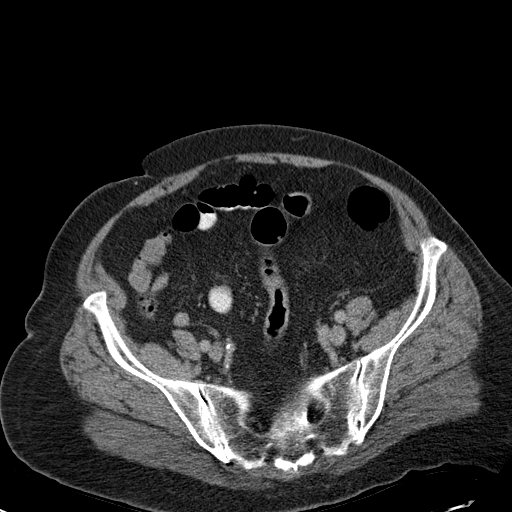
[im 38/91  soft-tissue]
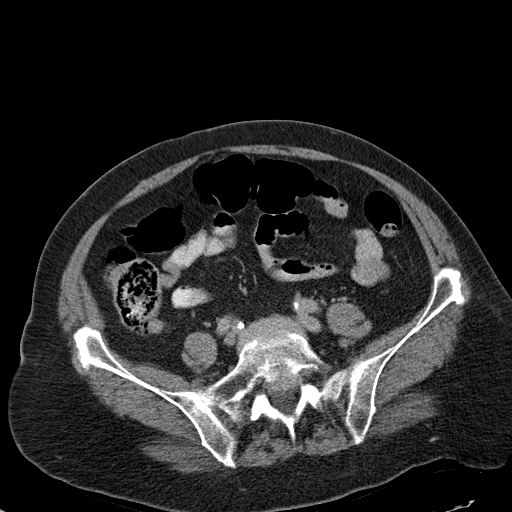
[im 48/91  soft-tissue]
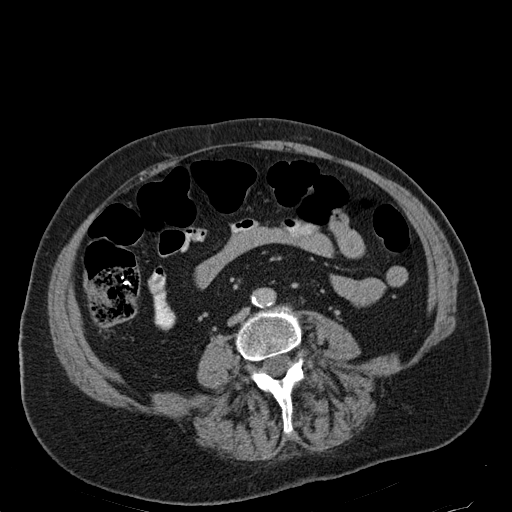
[im 53/91  soft-tissue]
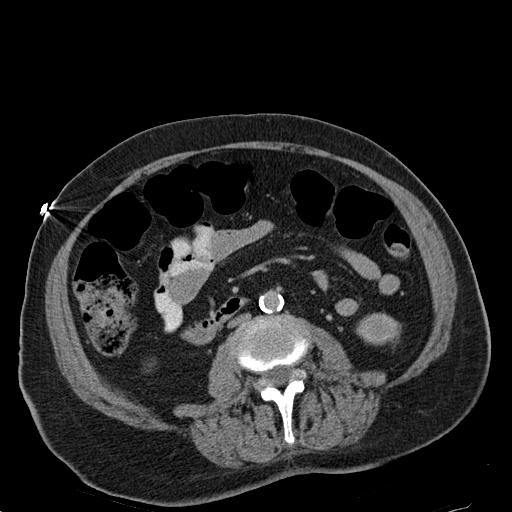
[im 59/91  soft-tissue]
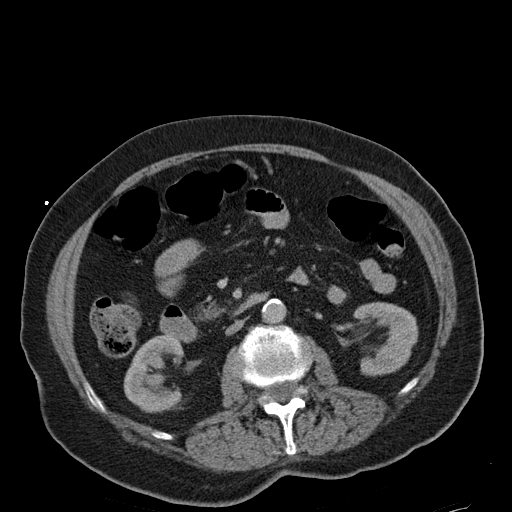
[im 59/91  bone]
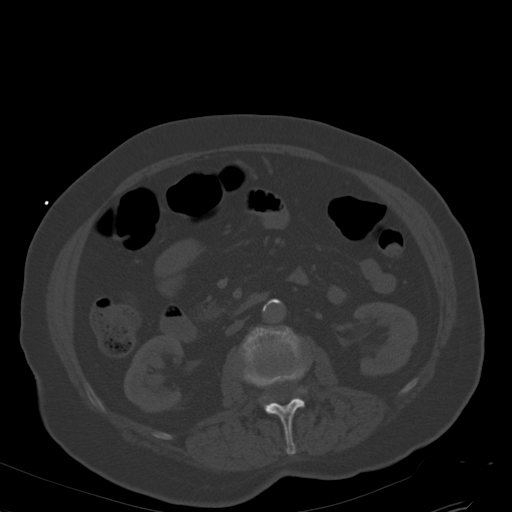
[im 64/91  soft-tissue]
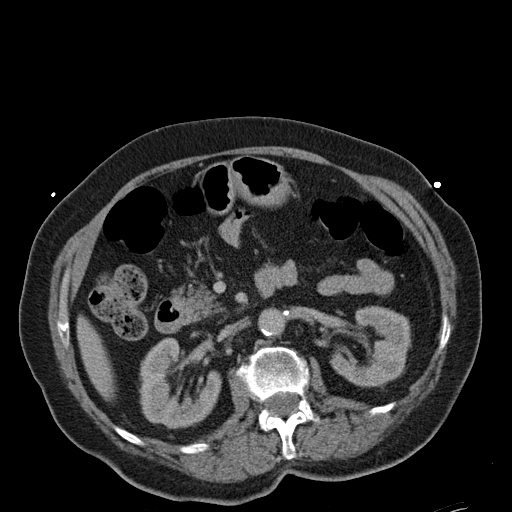
[im 69/91  soft-tissue]
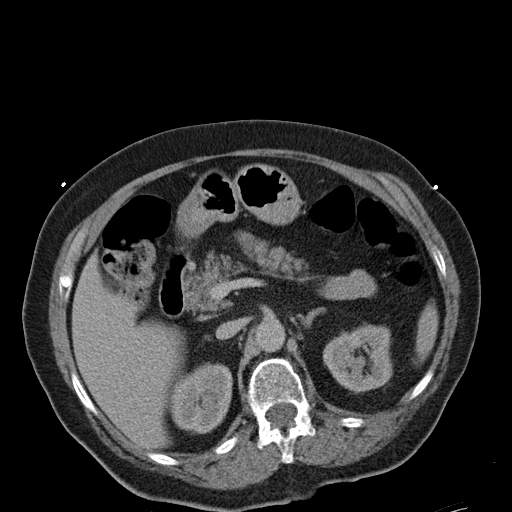
[im 80/91  soft-tissue]
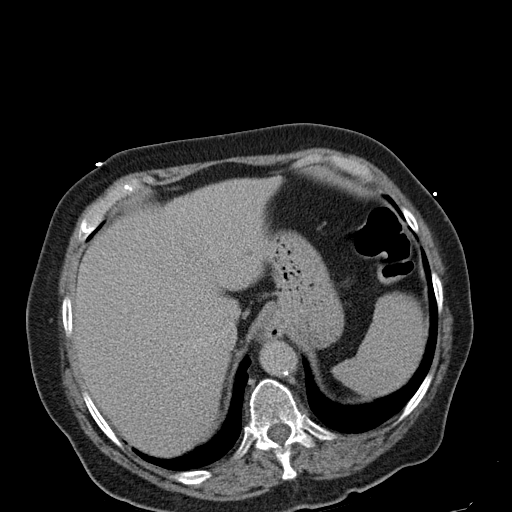
[im 85/91  soft-tissue]
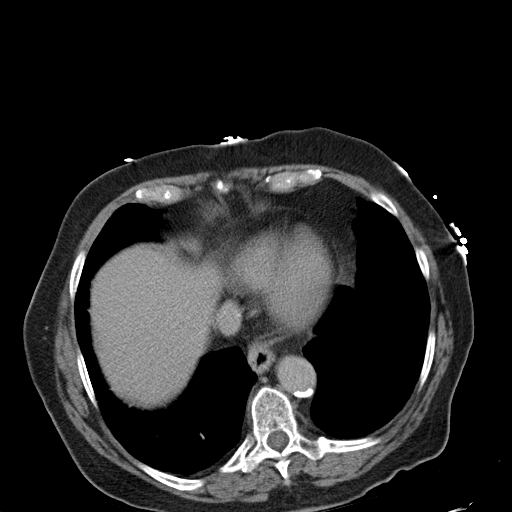

[Series 3: abd_pel_with 3.0 spo · coronal · 0.74mm/px · 3 of 89 slices shown]
[im 30/89  soft-tissue]
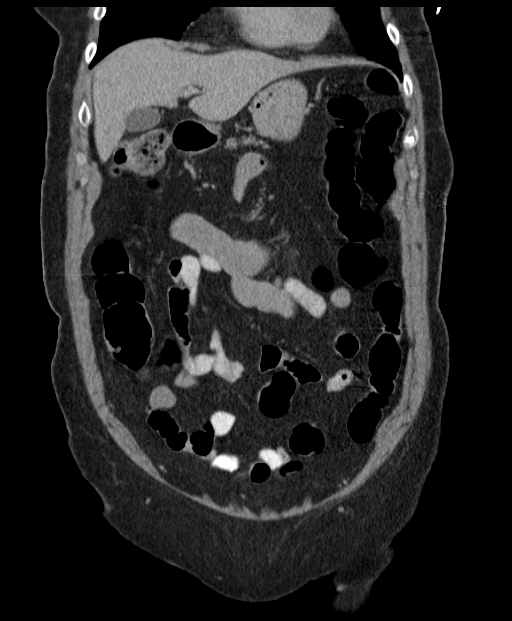
[im 40/89  soft-tissue]
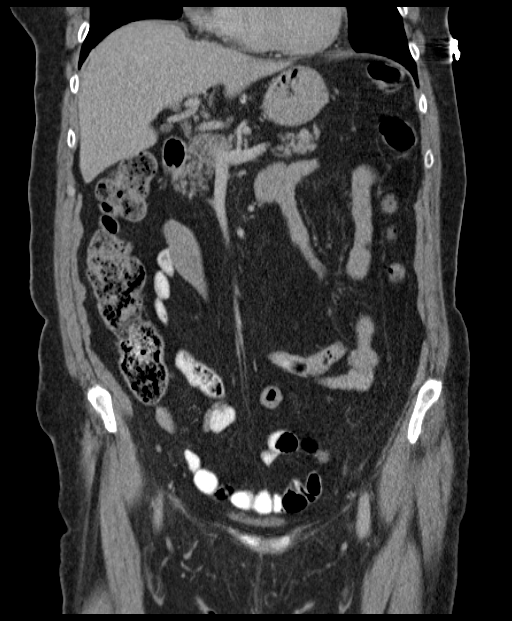
[im 49/89  soft-tissue]
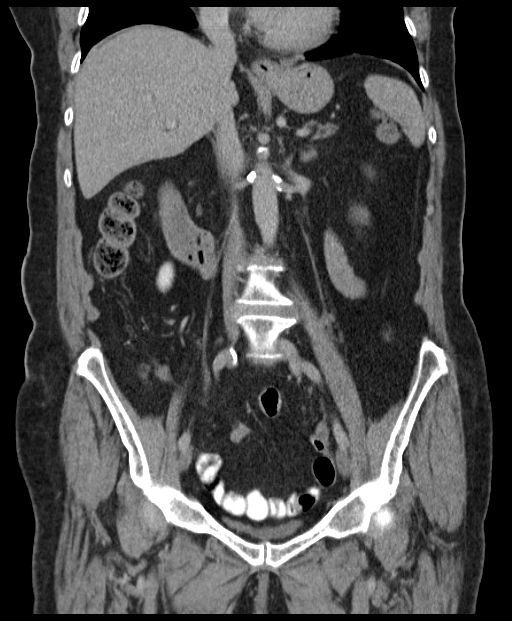

[16 of 46 positions shown; findings below may reference images not displayed]

FINDINGS: LUNG BASES: Included view of the lung bases demonstrate dependent
atelectasis. Visualized heart and pericardium are unremarkable.

SOLID ORGANS: The liver, spleen, gallbladder, pancreas are
unremarkable. Mildly thickened adrenal glands most consistent with
hyperplasia.

GASTROINTESTINAL TRACT: Small hiatal hernia. The stomach, small and
large bowel are normal in course and caliber without inflammatory
changes. Enteric contrast has not yet reached the distal small
bowel. Nonvisualized appendix consistent with provided history.

KIDNEYS/ URINARY TRACT: Kidneys are orthotopic, demonstrating
symmetric enhancement. No nephrolithiasis, hydronephrosis or solid
renal masses. The unopacified ureters are normal in course and
caliber. Delayed imaging through the kidneys demonstrates symmetric
prompt contrast excretion within the proximal urinary collecting
system. Urinary bladder is decompressed and unremarkable.

PERITONEUM/RETROPERITONEUM: Aortoiliac vessels are normal in course
and caliber, moderate calcific atherosclerosis. No lymphadenopathy
by CT size criteria. The uterus is redundant, versus small sub
serosal leiomyoma. No intraperitoneal free fluid nor free air.

SOFT TISSUE/OSSEOUS STRUCTURES: Non-suspicious. Moderate lower
lumbar facet arthropathy without acute lumbar spine fracture nor
malalignment.
IMPRESSION: No acute intra-abdominal or pelvic process.

Small hiatal hernia.

By: Anderson Sejin

## 2016-05-29 DIAGNOSIS — H5211 Myopia, right eye: Secondary | ICD-10-CM | POA: Diagnosis not present

## 2016-05-29 DIAGNOSIS — H43813 Vitreous degeneration, bilateral: Secondary | ICD-10-CM | POA: Diagnosis not present

## 2016-06-06 DIAGNOSIS — E538 Deficiency of other specified B group vitamins: Secondary | ICD-10-CM | POA: Diagnosis not present

## 2016-06-07 ENCOUNTER — Encounter: Payer: Self-pay | Admitting: Physician Assistant

## 2016-06-07 ENCOUNTER — Ambulatory Visit (INDEPENDENT_AMBULATORY_CARE_PROVIDER_SITE_OTHER): Payer: PPO | Admitting: Physician Assistant

## 2016-06-07 VITALS — BP 112/62 | HR 86 | Ht 62.0 in | Wt 153.6 lb

## 2016-06-07 DIAGNOSIS — I5032 Chronic diastolic (congestive) heart failure: Secondary | ICD-10-CM

## 2016-06-07 DIAGNOSIS — I10 Essential (primary) hypertension: Secondary | ICD-10-CM

## 2016-06-07 LAB — BASIC METABOLIC PANEL
BUN: 19 mg/dL (ref 7–25)
CALCIUM: 9.3 mg/dL (ref 8.6–10.4)
CO2: 27 mmol/L (ref 20–31)
CREATININE: 0.96 mg/dL — AB (ref 0.60–0.88)
Chloride: 103 mmol/L (ref 98–110)
Glucose, Bld: 77 mg/dL (ref 65–99)
Potassium: 4.7 mmol/L (ref 3.5–5.3)
Sodium: 140 mmol/L (ref 135–146)

## 2016-06-07 LAB — MAGNESIUM: Magnesium: 2.2 mg/dL (ref 1.5–2.5)

## 2016-06-07 MED ORDER — POTASSIUM CHLORIDE CRYS ER 20 MEQ PO TBCR: EXTENDED_RELEASE_TABLET | ORAL | Status: DC

## 2016-06-07 MED ORDER — EZETIMIBE 10 MG PO TABS: 10.0000 mg | ORAL_TABLET | Freq: Every day | ORAL | Status: DC

## 2016-06-07 MED ORDER — FUROSEMIDE 20 MG PO TABS: ORAL_TABLET | ORAL | Status: DC

## 2016-06-07 NOTE — Progress Notes (Signed)
Cardiology Office Note   Date:  06/07/2016   ID:  [name], [name] Oct 02, 1926, MRN [id number]  PCP:  Glo Herring., MD  Cardiologist:  Dr Stacy Gardner, PA-C   Chief Complaint  Patient presents with  . cramping legs hands feet    History of Present Illness: Jo Parrish is a 82 y.o. female with a history of Sjogren's syndrome, HTN, HLD, RA, LBBB, GERD. Echo 2014 w/ EF 45-50%, grade 1 dd.  Jo Parrish presents for Follow-up of her heart failure and assessment of the cramping pains in her extremities.  [name] is generally very active. She occasionally gets swelling in her lower extremities. She no longer has any Lasix and requests a prescription for some so that she can manage the volume. She feels that she generally eats a low-sodium diet but admits she may get some extra sodium upon occasion. She has severe obstructive sleep apnea and recently had a study which she states indicated she was having 70 episodes an hour. She has tried C Pap but has not been able to tolerated, despite trying different regimens and different equipment.  She has not had any ischemic symptoms. She never gets chest pain with exertion. Except when she is having swelling in her legs, she has no significant dyspnea on exertion. She mows the yard and does yard work. She goes dancing twice a week. She enjoys this. She has some fatigue, especially in hot muggy weather, but is doing fairly well with this. She is still working 8 hours a week.   She is intolerant of statins with muscle aches and does not wish to try one again. She brings in a lipid profile from her family physician. It shows an LDL of 117. She is compliant with the Zetia, but once a prescription for the generic medication. She admits that she has occasional high cholesterol intake such as this morning when she had breakfast sausage. However, she states she normally eats a low-sodium diet.   Past Medical History  Diagnosis Date  .  Hyperlipidemia   . Hypertension   . Bacterial overgrowth syndrome     Small bowel  . Sjogren's syndrome ([place])   . Rheumatoid arthritis(714.0)   . Hypothyroidism   . Complication of anesthesia   . PONV (postoperative nausea and vomiting)   . GERD (gastroesophageal reflux disease)   . Ulcer, esophagus   . Constipation   . OSA (obstructive sleep apnea)     UNABLE TO TOLERATE  MASK  . Left bundle branch block   . Urinary frequency 07/04/2015  . Vaginal burning 07/04/2015  . Vaginal dryness 07/04/2015  . Vaginal atrophy 08/02/2015  . Vulvar itching 08/15/2015  . UTI (lower urinary tract infection) 09/13/2015  . Hematuria 09/13/2015  . Cough 09/13/2015  . Wheezing on auscultation 09/13/2015  . UI (urinary incontinence) 01/16/2016    Past Surgical History  Procedure Laterality Date  . Appendectomy    . Esophagogastroduodenoscopy  06/07/2008     Gastritis. No celiac sprue/Small hiatal hernia  . Hydrogen breath test   05/17/2008    small bowel bacterial overgrowth/Hydrogen level rise consistent with small bowel bacterial  . Tonsillectomy    . Dilation and curettage of uterus    . Total knee arthroplasty Right 10/09/2013    Procedure: RIGHT TOTAL KNEE ARTHROPLASTY;  Surgeon: Gearlean Alf, MD;  Location: [facility];  Service: Orthopedics;  Laterality: Right;  . Esophagogastroduodenoscopy  June 2014    Dr.  Medoff, erosive esophagitis, grade B., large hiatal hernia.  . Colonoscopy  June 2014    Dr. Earlean Shawl, multiple tubular adenomas removed, internal hemorrhoids. Next colonoscopy in 3 years.    Current Outpatient Prescriptions  Medication Sig Dispense Refill  . amLODipine (NORVASC) 5 MG tablet Take 1 tablet (5 mg total) by mouth 2 (two) times daily. 180 tablet 1  . atenolol (TENORMIN) 25 MG tablet Take 25 mg by mouth daily.     . cholecalciferol (VITAMIN D) 1000 units tablet Take 1,000 Units by mouth daily.    Marland Kitchen ezetimibe (ZETIA) 10 MG tablet Take 1 tablet (10 mg total) by mouth at bedtime.  28 tablet 0  . irbesartan (AVAPRO) 300 MG tablet Take 300 mg by mouth every morning.     Marland Kitchen levothyroxine (SYNTHROID, LEVOTHROID) 75 MCG tablet Take 75 mcg by mouth daily before breakfast.    . omeprazole (PRILOSEC) 40 MG capsule Take 40 mg by mouth every morning.     Marland Kitchen OVER THE COUNTER MEDICATION Place 1 capsule under the tongue 3 (three) times daily. Muscle cramp pain reliever    . polyethylene glycol powder (GLYCOLAX) powder Take 17 g by mouth daily.     Marland Kitchen VITAMIN B1-B12 IJ Inject as directed every 30 (thirty) days.     No current facility-administered medications for this visit.    Allergies:   Amoxicillin; Levaquin; Statins; and Sulfa antibiotics    Social History:  The patient  reports that she has never smoked. She has never used smokeless tobacco. She reports that she does not drink alcohol or use illicit drugs.   Family History:  The patient's family history includes COPD in her son; Heart disease in her father; Liver disease in her father; Sleep apnea in her son; Stroke in her maternal grandmother and mother.    ROS:  Please see the history of present illness. All other systems are reviewed and negative.    PHYSICAL EXAM: VS:  BP 112/62 mmHg  Pulse 86  Ht 5\' 2"  (1.575 m)  Wt 153 lb 9.6 oz (69.673 kg)  BMI 28.09 kg/m2  SpO2 97% , BMI Body mass index is 28.09 kg/(m^2). GEN: Well nourished, well developed, female in no acute distress HEENT: normal for age  Neck: no JVD, no carotid bruit, no masses Cardiac: RRR; soft murmur, no rubs, or gallops Respiratory:  clear to auscultation bilaterally, normal work of breathing GI: soft, nontender, nondistended, + BS MS: no deformity or atrophy; no edema; distal pulses are 2+ in all 4 extremities  Skin: warm and dry, no rash Neuro:  Strength and sensation are intact Psych: euthymic mood, full affect   EKG:  EKG is not ordered today.   Recent Labs: 02/27/2016: ALT 26; BUN 11; Creatinine, Ser 0.77; Hemoglobin 12.7; Platelets 211;  Potassium 3.9; Sodium 135    Lipid Panel    Component Value Date/Time   CHOL 193 06/21/2015 0910   TRIG 137 06/21/2015 0910   HDL 51 06/21/2015 0910   CHOLHDL 3.8 06/21/2015 0910   VLDL 27 06/21/2015 0910   LDLCALC 115* 06/21/2015 0910     Wt Readings from Last 3 Encounters:  06/07/16 153 lb 9.6 oz (69.673 kg)  02/27/16 157 lb (71.215 kg)  01/16/16 160 lb (72.576 kg)     Other studies Reviewed: Additional studies/ records that were reviewed today include: Records from outside the system, office notes and previous evaluation.  ASSESSMENT AND PLAN:  1.  Chronic combined systolic and diastolic CHF: Her EF was  approximately 50% with grade 1 diastolic dysfunction in 123456. Her symptoms are generally well-controlled, she seems to be in a steady state except for occasional dietary indiscretions. We will give her prescription for when necessary Lasix as well as when necessary potassium.  2. Extremity cramping: We will check a BMET as well as magnesium level. Supplementation will be based on those results. She may get improvement in her symptoms with low dose electrolyte supplementation if her values are low-normal   Current medicines are reviewed at length with the patient today.  The patient does not have concerns regarding medicines.  The following changes have been made:  Add when necessary Lasix and potassium, generic prescription for Zetia  Labs/ tests ordered today include:   Orders Placed This Encounter  Procedures  . Basic metabolic panel  . Magnesium     Disposition:   FU with [name]  Signed, Rosaria Ferries, PA-C  06/07/2016 5:01 PM    [place] Phone: [phone]; Fax: [phone]  This note was written with the assistance of speech recognition software. Please excuse any transcriptional errors.

## 2016-06-07 NOTE — Patient Instructions (Signed)
Take Lasix 20 mg daily if needed  Take KDur 20 meq daily when you take Lasix   Lab work today ( bmet,magnesium )     Your physician wants you to follow-up in: 6 months with Dr.Berry. You will receive a reminder letter in the mail two months in advance. If you don't receive a letter, please call our office to schedule the follow-up appointment.

## 2016-06-20 ENCOUNTER — Telehealth: Payer: Self-pay | Admitting: Physician Assistant

## 2016-06-20 NOTE — Telephone Encounter (Signed)
FORWARD TO [place]HELLEY 

## 2016-06-20 NOTE — Telephone Encounter (Signed)
F/u  Pt returning RN phone call. Please call back and discuss.   

## 2016-06-20 NOTE — Telephone Encounter (Signed)
Called patient with results. Patient verbalized understanding. 

## 2016-07-26 DIAGNOSIS — E538 Deficiency of other specified B group vitamins: Secondary | ICD-10-CM | POA: Diagnosis not present

## 2016-07-27 DIAGNOSIS — N390 Urinary tract infection, site not specified: Secondary | ICD-10-CM | POA: Diagnosis not present

## 2016-08-01 ENCOUNTER — Ambulatory Visit: Payer: PPO | Admitting: Adult Health

## 2016-08-01 DIAGNOSIS — N390 Urinary tract infection, site not specified: Secondary | ICD-10-CM | POA: Diagnosis not present

## 2016-08-06 ENCOUNTER — Ambulatory Visit: Payer: PPO | Admitting: Adult Health

## 2016-08-07 DIAGNOSIS — M5136 Other intervertebral disc degeneration, lumbar region: Secondary | ICD-10-CM | POA: Diagnosis not present

## 2016-08-15 DIAGNOSIS — N3000 Acute cystitis without hematuria: Secondary | ICD-10-CM | POA: Diagnosis not present

## 2016-09-18 DIAGNOSIS — M5136 Other intervertebral disc degeneration, lumbar region: Secondary | ICD-10-CM | POA: Diagnosis not present

## 2016-10-16 DIAGNOSIS — M5136 Other intervertebral disc degeneration, lumbar region: Secondary | ICD-10-CM | POA: Diagnosis not present

## 2016-10-23 DIAGNOSIS — Z1389 Encounter for screening for other disorder: Secondary | ICD-10-CM | POA: Diagnosis not present

## 2016-10-23 DIAGNOSIS — K219 Gastro-esophageal reflux disease without esophagitis: Secondary | ICD-10-CM | POA: Diagnosis not present

## 2016-10-23 DIAGNOSIS — I10 Essential (primary) hypertension: Secondary | ICD-10-CM | POA: Diagnosis not present

## 2016-10-23 DIAGNOSIS — E063 Autoimmune thyroiditis: Secondary | ICD-10-CM | POA: Diagnosis not present

## 2016-10-23 DIAGNOSIS — Z6827 Body mass index (BMI) 27.0-27.9, adult: Secondary | ICD-10-CM | POA: Diagnosis not present

## 2016-11-02 ENCOUNTER — Ambulatory Visit (INDEPENDENT_AMBULATORY_CARE_PROVIDER_SITE_OTHER): Payer: PPO | Admitting: Cardiovascular Disease

## 2016-11-02 ENCOUNTER — Encounter: Payer: Self-pay | Admitting: Cardiovascular Disease

## 2016-11-02 VITALS — BP 130/74 | HR 70 | Ht 62.0 in | Wt 157.4 lb

## 2016-11-02 DIAGNOSIS — M5136 Other intervertebral disc degeneration, lumbar region: Secondary | ICD-10-CM | POA: Diagnosis not present

## 2016-11-02 DIAGNOSIS — I5032 Chronic diastolic (congestive) heart failure: Secondary | ICD-10-CM | POA: Diagnosis not present

## 2016-11-02 DIAGNOSIS — E785 Hyperlipidemia, unspecified: Secondary | ICD-10-CM | POA: Diagnosis not present

## 2016-11-02 DIAGNOSIS — I10 Essential (primary) hypertension: Secondary | ICD-10-CM

## 2016-11-02 DIAGNOSIS — M791 Myalgia: Secondary | ICD-10-CM | POA: Diagnosis not present

## 2016-11-02 NOTE — Assessment & Plan Note (Signed)
History of hyperlipidemia on Zetia followed by her PCP 

## 2016-11-02 NOTE — Assessment & Plan Note (Signed)
History of chronic diastolic heart failure with 2-D echo performed 12/29/12 which revealed basal septal hypertrophy, EF in the 40% range and grade 1 diastolic dysfunction. She is aware of salt restriction.

## 2016-11-02 NOTE — Progress Notes (Signed)
11/02/2016 Jo Parrish   [birth year]  [id number]  Primary Physician Glo Herring., MD Primary Cardiologist: Lorretta Harp MD [name]  HPI:  The patient is a delightful 82 year old mildly overweight widowed Caucasian female mother of 1 child who I last saw in the office approximately 11/15/15 She does admit to dietary indiscretion and has had lower extremity edema and spikes in blood pressure related to this. Her last 2-D echo revealed an EF of 45-50% with grade 1 diastolic dysfunction. She otherwise denies chest pain or shortness of breath. I did clear her for right total knee replacement with Dr. Moshe Salisbury, and she has since switched surgeons to Dr. Gaynelle Arabian who performed the surgery successfully. She was seen in the emergency room at [facility] on 12/05/14 with some chest pain and blood pressure of 172/94 which she attributes to having had salty green beans earlier that day. She is very precise and quantitative with measuring her blood pressure on a daily basis and taking when necessary amlodipine and atenolol with blood pressures that are "out of range". She has intermittent left bundle branch block. Since she was here 12 months ago she has been asymptomatic. She is very active and dances several times a week. She also has a black comfortable name [name].   Current Outpatient Prescriptions  Medication Sig Dispense Refill  . amLODipine (NORVASC) 5 MG tablet Take 1 tablet (5 mg total) by mouth 2 (two) times daily. 180 tablet 1  . atenolol (TENORMIN) 25 MG tablet Take 25 mg by mouth daily.     . cholecalciferol (VITAMIN D) 1000 units tablet Take 1,000 Units by mouth daily.    Marland Kitchen ezetimibe (ZETIA) 10 MG tablet Take 1 tablet (10 mg total) by mouth at bedtime. 90 tablet 3  . furosemide (LASIX) 20 MG tablet Take 20 mg daily if needed for swelling 90 tablet 3  . irbesartan (AVAPRO) 300 MG tablet Take 300 mg by mouth every morning.     Marland Kitchen levothyroxine  (SYNTHROID, LEVOTHROID) 75 MCG tablet Take 75 mcg by mouth daily before breakfast.    . omeprazole (PRILOSEC) 40 MG capsule Take 40 mg by mouth every morning.     Marland Kitchen OVER THE COUNTER MEDICATION Place 1 capsule under the tongue 3 (three) times daily. Muscle cramp pain reliever    . polyethylene glycol powder (GLYCOLAX) powder Take 17 g by mouth daily.     Marland Kitchen VITAMIN B1-B12 IJ Inject as directed every 30 (thirty) days.     No current facility-administered medications for this visit.     Allergies  Allergen Reactions  . Amoxicillin     Mouth sores  . Levaquin [Levofloxacin In D5w]     Mouth sores  . Statins Other (See Comments)    Leg problems  . Sulfa Antibiotics Other (See Comments)    Reaction unknown    Social History   Social History  . Marital status: Widowed    Spouse name: N/A  . Number of children: N/A  . Years of education: N/A   Occupational History  . [employer]    Social History Main Topics  . Smoking status: Never Smoker  . Smokeless tobacco: Never Used  . Alcohol use No  . Drug use: No  . Sexual activity: Not Currently    Birth control/ protection: Post-menopausal   Other Topics Concern  . Not on file   Social History Narrative  . No narrative on file  Review of Systems: General: negative for chills, fever, night sweats or weight changes.  Cardiovascular: negative for chest pain, dyspnea on exertion, edema, orthopnea, palpitations, paroxysmal nocturnal dyspnea or shortness of breath Dermatological: negative for rash Respiratory: negative for cough or wheezing Urologic: negative for hematuria Abdominal: negative for nausea, vomiting, diarrhea, bright red blood per rectum, melena, or hematemesis Neurologic: negative for visual changes, syncope, or dizziness All other systems reviewed and are otherwise negative except as noted above.    Blood pressure 130/74, pulse 70, height 5\' 2"  (1.575 m), weight 157 lb 6.4 oz (71.4 kg).  General appearance:  alert and no distress Neck: no adenopathy, no carotid bruit, no JVD, supple, symmetrical, trachea midline and thyroid not enlarged, symmetric, no tenderness/mass/nodules Lungs: clear to auscultation bilaterally Heart: regular rate and rhythm, S1, S2 normal, no murmur, click, rub or gallop Extremities: extremities normal, atraumatic, no cyanosis or edema  EKG sinus rhythm at 70 without ST or T-wave changes. I personally reviewed his EKG  ASSESSMENT AND PLAN:   Chronic diastolic heart failure History of chronic diastolic heart failure with 2-D echo performed 12/29/12 which revealed basal septal hypertrophy, EF in the 40% range and grade 1 diastolic dysfunction. She is aware of salt restriction.  Essential hypertension History of hypertension blood pressure measured at 130/74. She is on amlodipine, atenolol and Avapro. Continue current meds at current dosing  Hyperlipidemia History of hyperlipidemia on Zetia followed by her PCP.      Lorretta Harp MD FACP,FACC,FAHA, [facility] 11/02/2016 10:58 AM

## 2016-11-02 NOTE — Patient Instructions (Signed)

## 2016-11-02 NOTE — Assessment & Plan Note (Signed)
History of hypertension blood pressure measured at 130/74. She is on amlodipine, atenolol and Avapro. Continue current meds at current dosing

## 2016-11-07 DIAGNOSIS — E785 Hyperlipidemia, unspecified: Secondary | ICD-10-CM | POA: Diagnosis not present

## 2016-11-14 ENCOUNTER — Ambulatory Visit: Payer: PPO | Admitting: Cardiovascular Disease

## 2016-11-20 DIAGNOSIS — M5136 Other intervertebral disc degeneration, lumbar region: Secondary | ICD-10-CM | POA: Diagnosis not present

## 2016-11-21 ENCOUNTER — Ambulatory Visit: Payer: PPO | Admitting: Cardiovascular Disease

## 2016-12-19 DIAGNOSIS — Z1389 Encounter for screening for other disorder: Secondary | ICD-10-CM | POA: Diagnosis not present

## 2016-12-19 DIAGNOSIS — E538 Deficiency of other specified B group vitamins: Secondary | ICD-10-CM | POA: Diagnosis not present

## 2016-12-19 DIAGNOSIS — J329 Chronic sinusitis, unspecified: Secondary | ICD-10-CM | POA: Diagnosis not present

## 2016-12-19 DIAGNOSIS — I5032 Chronic diastolic (congestive) heart failure: Secondary | ICD-10-CM | POA: Diagnosis not present

## 2016-12-19 DIAGNOSIS — K219 Gastro-esophageal reflux disease without esophagitis: Secondary | ICD-10-CM | POA: Diagnosis not present

## 2016-12-19 DIAGNOSIS — E063 Autoimmune thyroiditis: Secondary | ICD-10-CM | POA: Diagnosis not present

## 2016-12-19 DIAGNOSIS — Z6828 Body mass index (BMI) 28.0-28.9, adult: Secondary | ICD-10-CM | POA: Diagnosis not present

## 2016-12-19 DIAGNOSIS — E663 Overweight: Secondary | ICD-10-CM | POA: Diagnosis not present

## 2017-01-08 DIAGNOSIS — N390 Urinary tract infection, site not specified: Secondary | ICD-10-CM | POA: Diagnosis not present

## 2017-02-07 DIAGNOSIS — Z1389 Encounter for screening for other disorder: Secondary | ICD-10-CM | POA: Diagnosis not present

## 2017-02-07 DIAGNOSIS — Z6827 Body mass index (BMI) 27.0-27.9, adult: Secondary | ICD-10-CM | POA: Diagnosis not present

## 2017-02-07 DIAGNOSIS — R55 Syncope and collapse: Secondary | ICD-10-CM | POA: Diagnosis not present

## 2017-02-07 DIAGNOSIS — E063 Autoimmune thyroiditis: Secondary | ICD-10-CM | POA: Diagnosis not present

## 2017-02-07 DIAGNOSIS — E538 Deficiency of other specified B group vitamins: Secondary | ICD-10-CM | POA: Diagnosis not present

## 2017-02-07 DIAGNOSIS — I5032 Chronic diastolic (congestive) heart failure: Secondary | ICD-10-CM | POA: Diagnosis not present

## 2017-02-12 ENCOUNTER — Encounter (HOSPITAL_COMMUNITY): Payer: PPO

## 2017-02-13 DIAGNOSIS — M5136 Other intervertebral disc degeneration, lumbar region: Secondary | ICD-10-CM | POA: Diagnosis not present

## 2017-02-14 ENCOUNTER — Encounter (HOSPITAL_COMMUNITY)
Admission: RE | Admit: 2017-02-14 | Discharge: 2017-02-14 | Disposition: A | Discharge status: Home / Self Care | Payer: PPO | Source: Ambulatory Visit | Attending: Internal Medicine | Admitting: Internal Medicine

## 2017-02-14 ENCOUNTER — Encounter (HOSPITAL_COMMUNITY): Payer: Self-pay

## 2017-02-14 DIAGNOSIS — Z1389 Encounter for screening for other disorder: Secondary | ICD-10-CM | POA: Diagnosis not present

## 2017-02-14 DIAGNOSIS — M810 Age-related osteoporosis without current pathological fracture: Secondary | ICD-10-CM | POA: Insufficient documentation

## 2017-02-14 LAB — COMPREHENSIVE METABOLIC PANEL
ALBUMIN: 4.2 g/dL (ref 3.5–5.0)
ALK PHOS: 40 U/L (ref 38–126)
ALT: 23 U/L (ref 14–54)
ANION GAP: 6 (ref 5–15)
AST: 26 U/L (ref 15–41)
BILIRUBIN TOTAL: 0.6 mg/dL (ref 0.3–1.2)
BUN: 14 mg/dL (ref 6–20)
CALCIUM: 9.2 mg/dL (ref 8.9–10.3)
CO2: 29 mmol/L (ref 22–32)
CREATININE: 0.75 mg/dL (ref 0.44–1.00)
Chloride: 105 mmol/L (ref 101–111)
GFR calc non Af Amer: >60 mL/min (ref >60)
GLUCOSE: 83 mg/dL (ref 65–99)
Potassium: 3.8 mmol/L (ref 3.5–5.1)
Sodium: 140 mmol/L (ref 135–145)
TOTAL PROTEIN: 7.3 g/dL (ref 6.5–8.1)

## 2017-02-14 LAB — PHOSPHORUS: Phosphorus: 3.7 mg/dL (ref 2.5–4.6)

## 2017-02-14 LAB — MAGNESIUM: Magnesium: 2 mg/dL (ref 1.7–2.4)

## 2017-02-14 MED ORDER — ZOLEDRONIC ACID 5 MG/100ML IV SOLN
INTRAVENOUS | Status: AC
  Filled 2017-02-14: qty 100

## 2017-02-14 MED ORDER — SODIUM CHLORIDE 0.9 % IV SOLN
INTRAVENOUS | Status: DC
  Administered 2017-02-14: 250 mL via INTRAVENOUS

## 2017-02-14 MED ORDER — ZOLEDRONIC ACID 5 MG/100ML IV SOLN
5.0000 mg | Freq: Once | INTRAVENOUS | Status: AC
  Administered 2017-02-14: 5 mg via INTRAVENOUS

## 2017-02-14 NOTE — Progress Notes (Signed)
Results for Jo Parrish, Jo Parrish (MRN [id number]) as of 02/14/2017 15:56  Yearly Reclast IV given as ordered. Tolerated well  Ref. Range 02/14/2017 13:00  Sodium Latest Ref Range: 135 - 145 mmol/L 140  Potassium Latest Ref Range: 3.5 - 5.1 mmol/L 3.8  Chloride Latest Ref Range: 101 - 111 mmol/L 105  CO2 Latest Ref Range: 22 - 32 mmol/L 29  Glucose Latest Ref Range: 65 - 99 mg/dL 83  BUN Latest Ref Range: 6 - 20 mg/dL 14  Creatinine Latest Ref Range: 0.44 - 1.00 mg/dL 0.75  Calcium Latest Ref Range: 8.9 - 10.3 mg/dL 9.2  Anion gap Latest Ref Range: 5 - 15  6  Phosphorus Latest Ref Range: 2.5 - 4.6 mg/dL 3.7  Magnesium Latest Ref Range: 1.7 - 2.4 mg/dL 2.0  Alkaline Phosphatase Latest Ref Range: 38 - 126 U/L 40  Albumin Latest Ref Range: 3.5 - 5.0 g/dL 4.2  AST Latest Ref Range: 15 - 41 U/L 26  ALT Latest Ref Range: 14 - 54 U/L 23  Total Protein Latest Ref Range: 6.5 - 8.1 g/dL 7.3  Total Bilirubin Latest Ref Range: 0.3 - 1.2 mg/dL 0.6  EGFR (African American) Latest Ref Range: >60 mL/min >60  EGFR (Non-African Amer.) Latest Ref Range: >60 mL/min >60

## 2017-03-13 DIAGNOSIS — E538 Deficiency of other specified B group vitamins: Secondary | ICD-10-CM | POA: Diagnosis not present

## 2017-03-19 DIAGNOSIS — E063 Autoimmune thyroiditis: Secondary | ICD-10-CM | POA: Diagnosis not present

## 2017-03-27 DIAGNOSIS — M5136 Other intervertebral disc degeneration, lumbar region: Secondary | ICD-10-CM | POA: Diagnosis not present

## 2017-04-03 DIAGNOSIS — G8929 Other chronic pain: Secondary | ICD-10-CM | POA: Diagnosis not present

## 2017-04-03 DIAGNOSIS — M5441 Lumbago with sciatica, right side: Secondary | ICD-10-CM | POA: Diagnosis not present

## 2017-04-03 DIAGNOSIS — M5442 Lumbago with sciatica, left side: Secondary | ICD-10-CM | POA: Diagnosis not present

## 2017-04-03 DIAGNOSIS — M5136 Other intervertebral disc degeneration, lumbar region: Secondary | ICD-10-CM | POA: Diagnosis not present

## 2017-04-10 DIAGNOSIS — M5136 Other intervertebral disc degeneration, lumbar region: Secondary | ICD-10-CM | POA: Diagnosis not present

## 2017-04-11 ENCOUNTER — Telehealth (HOSPITAL_COMMUNITY): Payer: Self-pay | Admitting: Physical Therapy

## 2017-04-11 NOTE — Telephone Encounter (Signed)
Pt came in a cx this apptment due to her son needing heart surgery. She will call us back to reschedule at a later date.

## 2017-04-16 ENCOUNTER — Ambulatory Visit (HOSPITAL_COMMUNITY): Payer: PPO | Admitting: Physical Therapy

## 2017-05-01 DIAGNOSIS — M5441 Lumbago with sciatica, right side: Secondary | ICD-10-CM | POA: Diagnosis not present

## 2017-05-01 DIAGNOSIS — M5442 Lumbago with sciatica, left side: Secondary | ICD-10-CM | POA: Diagnosis not present

## 2017-05-01 DIAGNOSIS — M5136 Other intervertebral disc degeneration, lumbar region: Secondary | ICD-10-CM | POA: Diagnosis not present

## 2017-05-01 DIAGNOSIS — M47816 Spondylosis without myelopathy or radiculopathy, lumbar region: Secondary | ICD-10-CM | POA: Diagnosis not present

## 2017-05-08 DIAGNOSIS — E538 Deficiency of other specified B group vitamins: Secondary | ICD-10-CM | POA: Diagnosis not present

## 2017-05-09 ENCOUNTER — Encounter (HOSPITAL_COMMUNITY): Payer: Self-pay | Admitting: Physical Therapy

## 2017-05-09 ENCOUNTER — Ambulatory Visit (HOSPITAL_COMMUNITY): Payer: PPO | Attending: Physical Medicine and Rehabilitation | Admitting: Physical Therapy

## 2017-05-09 DIAGNOSIS — R262 Difficulty in walking, not elsewhere classified: Secondary | ICD-10-CM | POA: Diagnosis not present

## 2017-05-09 DIAGNOSIS — M25551 Pain in right hip: Secondary | ICD-10-CM | POA: Diagnosis not present

## 2017-05-09 DIAGNOSIS — R29898 Other symptoms and signs involving the musculoskeletal system: Secondary | ICD-10-CM | POA: Insufficient documentation

## 2017-05-09 DIAGNOSIS — T1501XA Foreign body in cornea, right eye, initial encounter: Secondary | ICD-10-CM | POA: Diagnosis not present

## 2017-05-09 DIAGNOSIS — G8929 Other chronic pain: Secondary | ICD-10-CM | POA: Diagnosis not present

## 2017-05-09 DIAGNOSIS — M545 Low back pain: Secondary | ICD-10-CM | POA: Insufficient documentation

## 2017-05-09 DIAGNOSIS — M6281 Muscle weakness (generalized): Secondary | ICD-10-CM | POA: Insufficient documentation

## 2017-05-09 NOTE — Patient Instructions (Signed)
   SEATED HAMSTRING STRETCH  While seated, rest your heel on the floor with your knee straight and gently lean forward until a stretch is felt behind your knee/thigh.  Hold at least 30 seconds, 3 times each leg, twice a day.     Piriformis Stretch (Seated)  While sitting in chair, cross affected leg on top of the other as shown. Next, gently lean forward until a stretch is felt. For more intense stretch, lean further forward.   Do not hunch- keep your back straight and lean forward at your hips.  Hold for 30 seconds, repeat twice each leg, twice a day.     Controlled Stand to Sit  Start in a standing position. As you begin to sit down, count slowly from 1 to 4. Your bottom should reach the table on 4.   Repeat 5-10 times, twice a day.    TANDEM STANCE WITH SUPPORT  Stand in front of a chair, table or counter top for support. Then place the heel of one foot so that it is touching the toes of the other foot. Maintain your balance in this position.   Hold for 15 seconds, twice each side, twice a day.

## 2017-05-09 NOTE — Therapy (Signed)
[place], [place], [address] Phone: [phone]   Fax:  [phone]  Physical Therapy Evaluation  Patient Details  Name: Jo Parrish MRN: [id number] Date of Birth: 09/17/1926 Referring Provider: Suella Broad   Encounter Date: 05/09/2017      PT End of Session - 05/09/17 1220    Visit Number 1   Number of Visits 13   Date for PT Re-Evaluation 05/30/17   Authorization Type [employer]    Authorization Time Period 05/09/17 to 06/20/17   Authorization - Visit Number 1   Authorization - Number of Visits 10   PT Start Time 0858   PT Stop Time 0940   PT Time Calculation (min) 42 min   Activity Tolerance Patient tolerated treatment well   Behavior During Therapy [facility] for tasks assessed/performed      Past Medical History:  Diagnosis Date  . Bacterial overgrowth syndrome    Small bowel  . Complication of anesthesia   . Constipation   . Cough 09/13/2015  . GERD (gastroesophageal reflux disease)   . Hematuria 09/13/2015  . Hyperlipidemia   . Hypertension   . Hypothyroidism   . Left bundle branch block   . OSA (obstructive sleep apnea)    UNABLE TO TOLERATE  MASK  . PONV (postoperative nausea and vomiting)   . Rheumatoid arthritis(714.0)   . Sjogren's syndrome ([place])   . UI (urinary incontinence) 01/16/2016  . Ulcer, esophagus   . Urinary frequency 07/04/2015  . UTI (lower urinary tract infection) 09/13/2015  . Vaginal atrophy 08/02/2015  . Vaginal burning 07/04/2015  . Vaginal dryness 07/04/2015  . Vulvar itching 08/15/2015  . Wheezing on auscultation 09/13/2015    Past Surgical History:  Procedure Laterality Date  . APPENDECTOMY    . COLONOSCOPY  June 2014   Dr. Earlean Shawl, multiple tubular adenomas removed, internal hemorrhoids. Next colonoscopy in 3 years.  Marland Kitchen DILATION AND CURETTAGE OF UTERUS    . ESOPHAGOGASTRODUODENOSCOPY  06/07/2008    Gastritis. No celiac sprue/Small hiatal hernia  .  ESOPHAGOGASTRODUODENOSCOPY  June 2014   Dr. Earlean Shawl, erosive esophagitis, grade B., large hiatal hernia.  . Hydrogen breath test   05/17/2008   small bowel bacterial overgrowth/Hydrogen level rise consistent with small bowel bacterial  . TONSILLECTOMY    . TOTAL KNEE ARTHROPLASTY Right 10/09/2013   Procedure: RIGHT TOTAL KNEE ARTHROPLASTY;  Surgeon: Gearlean Alf, MD;  Location: [facility];  Service: Orthopedics;  Laterality: Right;    There were no vitals filed for this visit.       Subjective Assessment - 05/09/17 0859    Subjective Patient arrives stating that she is not really having a pain, she thinks she might have injured herself by flopping down on commode at home or a gland that has swollen up and has become painful. This has been going on for 2-3 weeks now, she states her back is really doing very well. No falls or close calls recently. No numbness or tingling is going on anywhere.    Pertinent History diastolic heart failure, hypothyroidism, HTN, Sjogrens, R TKR    How long can you sit comfortably? painful but she is unlimited    How long can you stand comfortably? bothers her somewhat, but fairly unlimited    How long can you walk comfortably? she is very limited with walking   Patient Stated Goals work on painful area of R hip, balance    Currently in Pain? No/denies  [facility] PT Assessment - 05/09/17 0001      Assessment   Medical Diagnosis back pain    Referring Provider Suella Broad    Onset Date/Surgical Date --  chrnioc    Next MD Visit Dr. Nelva Bush after PT    Prior Therapy none      Precautions   Precautions Fall     Balance Screen   Has the patient fallen in the past 6 months No   Has the patient had a decrease in activity level because of a fear of falling?  Yes   Is the patient reluctant to leave their home because of a fear of falling?  No     Prior Function   Level of Independence Independent;Independent with basic ADLs;Independent with  gait;Independent with transfers   Vocation Part time employment   [employer]    Leisure dancing, housework      Observation/Other Assessments   Observations FABER, scour, SLR (-) B; tight ADD muscles B    Focus on Therapeutic Outcomes (FOTO)  63% limited      AROM   Lumbar Flexion WFL; RFIS no change but does have pulling sensation in hip    Lumbar Extension WFL; REIS no change    Lumbar - Right Side Bend WFL   stretching    Lumbar - Left Side Bend fingertips to knee midline, stretching      Strength   Right Hip Flexion 4-/5   Right Hip Extension 3/5   Right Hip ABduction 4/5   Left Hip Flexion 4/5   Left Hip Extension 3/5   Left Hip ABduction 3+/5   Right Knee Flexion 3/5  pain limited    Right Knee Extension 5/5   Left Knee Flexion 5/5   Left Knee Extension 5/5   Right Ankle Dorsiflexion 5/5   Left Ankle Dorsiflexion 5/5     Flexibility   Hamstrings severe limitation R, moderate limitation L    Piriformis moderate limitation B      Palpation   Palpation comment muscle knotting noted R gultesal area      High Level Balance   High Level Balance Comments TUG 11.17, SLS 4-8 seconds best                            PT Education - 05/09/17 1220    Education provided Yes   Education Details prognosis, exam findings, HEP, POC    Person(s) Educated Patient   Methods Explanation;Demonstration;Handout   Comprehension Verbalized understanding;Returned demonstration;Need further instruction          PT Short Term Goals - 05/09/17 1228      PT SHORT TERM GOAL #1   Title Patient to consistently demonstrate good eccentric control when performing stand to sit of surfaces of various heights in order to show improved functional strength and control    Time 3   Period Weeks   Status New     PT SHORT TERM GOAL #2   Title Patient to demonstate at least a 50% improvement in muscle flexilbity all tested groups in order to improve mechanics  and pain    Time 3   Period Weeks   Status New     PT SHORT TERM GOAL #3   Title Patient to be able to maintain good posture at least 70% of the time without cues in order to show improved core strength and to reduce pain    Time 3  Period Weeks   Status New     PT SHORT TERM GOAL #4   Title Patient to be independent with appropriate HEP, to be updated PRN    Time 1   Period Weeks   Status New           PT Long Term Goals - 05/09/17 1230      PT LONG TERM GOAL #1   Title Patient to demonstate functional strength as being 5/5 in all tested groups in order to show improved strength and motor control    Time 6   Period Weeks   Status New     PT LONG TERM GOAL #2   Title Patient to be able to maintain SLS for at least 30 seconds in order to show improved balance/reduced fall risk    Time 6   Period Weeks   Status New     PT LONG TERM GOAL #3   Title Patient to be participatory in regular exercise program (flexilibility, cardio, strength) in order to maintain functional gains and prevent recurrence of this problem    Time 6   Period Weeks   Status New     PT LONG TERM GOAL #4   Title Patient to report she has been able to go dancing 3 times per week with no pain exacerbation in order to improve QOL and return to PLOF    Time [place] - 05/09/17 1222    Clinical Impression Statement Patient arrives today reporting great concern over soreness in her R hip, she is scared she may have injured it "falling" onto the commode; she states that her back still bothers her but after some injections from her MD it is feeling much better. Examination reveals impaired posture, significant deficits with muscle flexibility especially R LE, unsteadiness, functional weakness, difficulty walking, and reduce ability to perform PLOF based tasks at this time. Suspect that patient's R hip pain is mostly due to muscle imbalance/compensations  regarding functional strength and flexibility per evaluation findings. Recommend skilled PT services to address functional deficits and assist in returning to optimal level of function.    Rehab Potential Good   Clinical Impairments Affecting Rehab Potential (+) high level of motivation, high PLOF   PT Frequency 2x / week   PT Duration 6 weeks   PT Treatment/Interventions ADLs/Self Care Home Management;Biofeedback;Gait training;Stair training;Functional mobility training;Therapeutic activities;Therapeutic exercise;Balance training;Neuromuscular re-education;Patient/family education;Manual techniques   PT Next Visit Plan review initial eval/goals, HEP; muscle flexilibity, manual to R glutes/hamstring, functional strengthening    PT Home Exercise Plan Eval; HS stretch, piriformis stretch, slow stand to sit, tandem stance    Consulted and Agree with Plan of Care Patient      Patient will benefit from skilled therapeutic intervention in order to improve the following deficits and impairments:  Increased fascial restricitons, Improper body mechanics, Pain, Decreased coordination, Decreased mobility, Increased muscle spasms, Postural dysfunction, Decreased activity tolerance, Decreased strength, Decreased balance, Difficulty walking, Impaired flexibility  Visit Diagnosis: Chronic midline low back pain, with sciatica presence unspecified - Plan: PT plan of care cert/re-cert  Pain in right hip - Plan: PT plan of care cert/re-cert  Muscle weakness (generalized) - Plan: PT plan of care cert/re-cert  Difficulty in walking, not elsewhere classified - Plan: PT plan of care cert/re-cert  Other symptoms and signs involving the musculoskeletal system - Plan: PT  plan of care cert/re-cert      G-Codes - 86/57/84 1233    Functional Assessment Tool Used (Outpatient Only) Based on skilled clinical assessment of strength, gait, flexibility, pain patterns, posture    Functional Limitation Mobility: Walking and  moving around   Mobility: Walking and Moving Around Current Status [phone]) At least 40 percent but less than 60 percent impaired, limited or restricted   Mobility: Walking and Moving Around Goal Status [phone]) At least 20 percent but less than 40 percent impaired, limited or restricted       Problem List Patient Active Problem List   Diagnosis Date Noted  . UI (urinary incontinence) 01/16/2016  . UTI (lower urinary tract infection) 09/13/2015  . Hematuria 09/13/2015  . Cough 09/13/2015  . Wheezing on auscultation 09/13/2015  . Vulvar itching 08/15/2015  . Vaginal atrophy 08/02/2015  . Urinary frequency 07/04/2015  . Vaginal burning 07/04/2015  . Vaginal dryness 07/04/2015  . Rash, legs 06/21/2015  . Constipation 01/07/2014  . Anemia 10/20/2013  . Heme positive stool 10/20/2013  . Abnormal LFTs 10/20/2013  . Nausea alone 10/20/2013  . Cellulitis of leg, right 10/16/2013  . Unspecified constipation 10/16/2013  . Postoperative anemia due to acute blood loss 10/12/2013  . Hyponatremia 10/12/2013  . OA (osteoarthritis) of knee 10/09/2013  . Heart palpitations 10/07/2013  . Unspecified hypothyroidism 08/13/2013  . Chronic diastolic heart failure ([place]) 08/06/2013  . Essential hypertension 08/06/2013  . Hyperlipidemia 08/06/2013  . Knee pain 03/03/2013   Deniece Ree PT, DPT [place] [address] [place], [place], [address] Phone: [phone]   Fax:  [phone]  Name: Jo Parrish MRN: [id number] Date of Birth: [birth year]

## 2017-05-15 ENCOUNTER — Ambulatory Visit (HOSPITAL_COMMUNITY)
Admission: RE | Admit: 2017-05-15 | Discharge: 2017-05-15 | Disposition: A | Discharge status: Home / Self Care | Payer: PPO | Source: Ambulatory Visit | Attending: Internal Medicine | Admitting: Internal Medicine

## 2017-05-15 ENCOUNTER — Other Ambulatory Visit (HOSPITAL_COMMUNITY): Payer: Self-pay | Admitting: Internal Medicine

## 2017-05-15 ENCOUNTER — Ambulatory Visit (HOSPITAL_COMMUNITY): Payer: PPO | Admitting: Physical Therapy

## 2017-05-15 DIAGNOSIS — Z6827 Body mass index (BMI) 27.0-27.9, adult: Secondary | ICD-10-CM | POA: Diagnosis not present

## 2017-05-15 DIAGNOSIS — R05 Cough: Secondary | ICD-10-CM | POA: Diagnosis not present

## 2017-05-15 DIAGNOSIS — M545 Low back pain: Principal | ICD-10-CM

## 2017-05-15 DIAGNOSIS — R102 Pelvic and perineal pain: Secondary | ICD-10-CM | POA: Diagnosis not present

## 2017-05-15 DIAGNOSIS — M5431 Sciatica, right side: Secondary | ICD-10-CM | POA: Diagnosis not present

## 2017-05-15 DIAGNOSIS — I700 Atherosclerosis of aorta: Secondary | ICD-10-CM | POA: Diagnosis not present

## 2017-05-15 DIAGNOSIS — R059 Cough, unspecified: Secondary | ICD-10-CM

## 2017-05-15 DIAGNOSIS — E063 Autoimmune thyroiditis: Secondary | ICD-10-CM | POA: Diagnosis not present

## 2017-05-15 DIAGNOSIS — R29898 Other symptoms and signs involving the musculoskeletal system: Secondary | ICD-10-CM

## 2017-05-15 DIAGNOSIS — M25551 Pain in right hip: Secondary | ICD-10-CM

## 2017-05-15 DIAGNOSIS — I10 Essential (primary) hypertension: Secondary | ICD-10-CM | POA: Diagnosis not present

## 2017-05-15 DIAGNOSIS — S300XXA Contusion of lower back and pelvis, initial encounter: Secondary | ICD-10-CM | POA: Insufficient documentation

## 2017-05-15 DIAGNOSIS — I5032 Chronic diastolic (congestive) heart failure: Secondary | ICD-10-CM | POA: Diagnosis not present

## 2017-05-15 DIAGNOSIS — G8929 Other chronic pain: Secondary | ICD-10-CM

## 2017-05-15 DIAGNOSIS — Z1389 Encounter for screening for other disorder: Secondary | ICD-10-CM | POA: Diagnosis not present

## 2017-05-15 DIAGNOSIS — G5701 Lesion of sciatic nerve, right lower limb: Secondary | ICD-10-CM | POA: Insufficient documentation

## 2017-05-15 DIAGNOSIS — X58XXXA Exposure to other specified factors, initial encounter: Secondary | ICD-10-CM | POA: Diagnosis not present

## 2017-05-15 DIAGNOSIS — R262 Difficulty in walking, not elsewhere classified: Secondary | ICD-10-CM

## 2017-05-15 DIAGNOSIS — M353 Polymyalgia rheumatica: Secondary | ICD-10-CM | POA: Diagnosis not present

## 2017-05-15 DIAGNOSIS — M6281 Muscle weakness (generalized): Secondary | ICD-10-CM

## 2017-05-15 NOTE — Therapy (Addendum)
[place], [place], [address] Phone: [phone]   Fax:  [phone]  Physical Therapy Treatment  Patient Details  Name: Jo Parrish MRN: [id number] Date of Birth: 1926-08-13 Referring Provider: Suella Broad   Encounter Date: 05/15/2017      PT End of Session - 05/15/17 0939    Visit Number 2   Number of Visits 13   Date for PT Re-Evaluation 05/30/17   Authorization Type [employer]    Authorization Time Period 05/09/17 to 06/20/17   Authorization - Visit Number 2   Authorization - Number of Visits 10   PT Start Time 0820   PT Stop Time 0904   PT Time Calculation (min) 44 min   Activity Tolerance Patient tolerated treatment well   Behavior During Therapy [facility] for tasks assessed/performed      Past Medical History:  Diagnosis Date  . Bacterial overgrowth syndrome    Small bowel  . Complication of anesthesia   . Constipation   . Cough 09/13/2015  . GERD (gastroesophageal reflux disease)   . Hematuria 09/13/2015  . Hyperlipidemia   . Hypertension   . Hypothyroidism   . Left bundle branch block   . OSA (obstructive sleep apnea)    UNABLE TO TOLERATE  MASK  . PONV (postoperative nausea and vomiting)   . Rheumatoid arthritis(714.0)   . Sjogren's syndrome ([place])   . UI (urinary incontinence) 01/16/2016  . Ulcer, esophagus   . Urinary frequency 07/04/2015  . UTI (lower urinary tract infection) 09/13/2015  . Vaginal atrophy 08/02/2015  . Vaginal burning 07/04/2015  . Vaginal dryness 07/04/2015  . Vulvar itching 08/15/2015  . Wheezing on auscultation 09/13/2015    Past Surgical History:  Procedure Laterality Date  . APPENDECTOMY    . COLONOSCOPY  June 2014   Dr. Earlean Shawl, multiple tubular adenomas removed, internal hemorrhoids. Next colonoscopy in 3 years.  Mar[place] Kitchen DILATION AND CURETTAGE OF UTERUS    . ESOPHAGOGASTRODUODENOSCOPY  06/07/2008    Gastritis. No celiac sprue/Small hiatal hernia  .  ESOPHAGOGASTRODUODENOSCOPY  June 2014   Dr. Earlean Shawl, erosive esophagitis, grade B., large hiatal hernia.  . Hydrogen breath test   05/17/2008   small bowel bacterial overgrowth/Hydrogen level rise consistent with small bowel bacterial  . TONSILLECTOMY    . TOTAL KNEE ARTHROPLASTY Right 10/09/2013   Procedure: RIGHT TOTAL KNEE ARTHROPLASTY;  Surgeon: Gearlean Alf, MD;  Location: [facility];  Service: Orthopedics;  Laterality: Right;    There were no vitals filed for this visit.      Subjective Assessment - 05/15/17 0823    Subjective Pt states she only wants treatment on her back due to that is why she was referred to therapy; she does not want treatment on hamstring or hip    Pertinent History diastolic heart failure, hypothyroidism, HTN, Sjogrens, R TKR    How long can you sit comfortably? painful but she is unlimited    How long can you stand comfortably? bothers her somewhat, but fairly unlimited    How long can you walk comfortably? she is very limited with walking   Patient Stated Goals work on painful area of R hip, balance    Currently in Pain? Yes   Pain Score 8    Pain Location Leg   Pain Orientation Right;Posterior   Pain Descriptors / Indicators Burning   Pain Type Acute pain   Pain Onset 1 to 4 weeks ago   Pain Frequency Constant  Aggravating Factors  not sure   Pain Relieving Factors not sure    Effect of Pain on Daily Activities not sure                          [facility] Adult PT Treatment/Exercise - 05/15/17 0001      Exercises   Exercises Lumbar     Lumbar Exercises: Stretches   Active Hamstring Stretch 3 reps;30 seconds   Standing Side Bend 3 reps   Standing Extension 3 reps     Lumbar Exercises: Standing   Functional Squats 10 reps   Other Standing Lumbar Exercises side stepping with red t-band x 2 RT    Other Standing Lumbar Exercises Tandem stance 2 x 30" each                 PT Education - 05/15/17 0853    Education provided  Yes   Education Details body mechanics for getting in and out of the bed ; HEP    Person(s) Educated Patient   Methods Explanation   Comprehension Verbalized understanding          PT Short Term Goals - 05/15/17 0942      PT SHORT TERM GOAL #1   Title Patient to consistently demonstrate good eccentric control when performing stand to sit of surfaces of various heights in order to show improved functional strength and control    Time 3   Period Weeks   Status On-going     PT SHORT TERM GOAL #2   Title Patient to demonstate at least a 50% improvement in muscle flexilbity all tested groups in order to improve mechanics and pain    Time 3   Period Weeks   Status On-going     PT SHORT TERM GOAL #3   Title Patient to be able to maintain good posture at least 70% of the time without cues in order to show improved core strength and to reduce pain    Time 3   Period Weeks   Status On-going     PT SHORT TERM GOAL #4   Title Patient to be independent with appropriate HEP, to be updated PRN    Time 1   Period Weeks   Status On-going           PT Long Term Goals - 05/15/17 5681      PT LONG TERM GOAL #1   Title Patient to demonstate functional strength as being 5/5 in all tested groups in order to show improved strength and motor control    Time 6   Period Weeks   Status On-going     PT LONG TERM GOAL #2   Title Patient to be able to maintain SLS for at least 30 seconds in order to show improved balance/reduced fall risk    Time 6   Period Weeks   Status On-going     PT LONG TERM GOAL #3   Title Patient to be participatory in regular exercise program (flexilibility, cardio, strength) in order to maintain functional gains and prevent recurrence of this problem    Time 6   Period Weeks   Status On-going     PT LONG TERM GOAL #4   Title Patient to report she has been able to go dancing 3 times per week with no pain exacerbation in order to improve QOL and return to PLOF     Time 6   Period Weeks  Status On-going      [id number] CJ [id number] CK         Plan - 05/15/17 0939    Clinical Impression Statement Reviewed evaluation and goals with patient.  Pt desires [place] to focus only on back at this time as this is why she was referred to therapy.  Pt is fearful of falling and is appreciative of balance exercises.  Pt instructed in functional strengthening and body mechanics.    Rehab Potential Good   Clinical Impairments Affecting Rehab Potential (+) high level of motivation, high PLOF   PT Frequency 2x / week   PT Duration 6 weeks   PT Treatment/Interventions ADLs/Self Care Home Management;Biofeedback;Gait training;Stair training;Functional mobility training;Therapeutic activities;Therapeutic exercise;Balance training;Neuromuscular re-education;Patient/family education;Manual techniques   PT Next Visit Plan Pt may benefit from decompressive exercises; continue to focus on balance, flexibility, and strengtheing.    PT Home Exercise Plan Eval; HS stretch, piriformis stretch, slow stand to sit, tandem stance    Consulted and Agree with Plan of Care Patient      Patient will benefit from skilled therapeutic intervention in order to improve the following deficits and impairments:  Increased fascial restricitons, Improper body mechanics, Pain, Decreased coordination, Decreased mobility, Increased muscle spasms, Postural dysfunction, Decreased activity tolerance, Decreased strength, Decreased balance, Difficulty walking, Impaired flexibility  Visit Diagnosis: Chronic midline low back pain, with sciatica presence unspecified  Pain in right hip  Muscle weakness (generalized)  Difficulty in walking, not elsewhere classified  Other symptoms and signs involving the musculoskeletal system     Problem List Patient Active Problem List   Diagnosis Date Noted  . UI (urinary incontinence) 01/16/2016  . UTI (lower urinary tract infection) 09/13/2015  . Hematuria  09/13/2015  . Cough 09/13/2015  . Wheezing on auscultation 09/13/2015  . Vulvar itching 08/15/2015  . Vaginal atrophy 08/02/2015  . Urinary frequency 07/04/2015  . Vaginal burning 07/04/2015  . Vaginal dryness 07/04/2015  . Rash, legs 06/21/2015  . Constipation 01/07/2014  . Anemia 10/20/2013  . Heme positive stool 10/20/2013  . Abnormal LFTs 10/20/2013  . Nausea alone 10/20/2013  . Cellulitis of leg, right 10/16/2013  . Unspecified constipation 10/16/2013  . Postoperative anemia due to acute blood loss 10/12/2013  . Hyponatremia 10/12/2013  . OA (osteoarthritis) of knee 10/09/2013  . Heart palpitations 10/07/2013  . Unspecified hypothyroidism 08/13/2013  . Chronic diastolic heart failure ([place]) 08/06/2013  . Essential hypertension 08/06/2013  . Hyperlipidemia 08/06/2013  . Knee pain 03/03/2013    Rayetta Humphrey, PT CLT [phone] 05/15/2017, 9:48 AM  [place] [address] [place], [place], [address] Phone: [phone]   Fax:  [phone]  Name: Jo Parrish MRN: [id number] Date of Birth: 05-13-1926   06/27/2017 Pt discharged due to not returning to clinic in one month period.    PHYSICAL THERAPY DISCHARGE SUMMARY  Visits from Start of Care: 2  Current functional level related to goals / functional outcomes: As above   Remaining deficits: As above   Education / Equipment: HEP Plan: Patient agrees to discharge.  Patient goals were not met. Patient is being discharged due to not returning since the last visit.  ?????  Rayetta Humphrey, [place] CLT [phone]

## 2017-05-15 NOTE — Patient Instructions (Signed)
Backward Bend (Standing)    Arch backward to make hollow of back deeper. Hold __4-5__ seconds. Repeat _5___ times per set. Do __1__ sets per session. Do __2__ sessions per day.  http://orth.exer.us/178   Copyright  VHI. All rights reserved.  Hamstring Stretch: Active    Support behind right knee. Starting with knee bent, attempt to straighten knee until a comfortable stretch is felt in back of thigh. Hold _30___ seconds. Repeat _3___ times per set. Do _1___ sets per session. Do __2__ sessions per day.  http://orth.exer.us/158   Copyright  VHI. All rights reserved.  Isometric Abdominal    Lying on back with knees bent, tighten stomach . Hold __3-5__ seconds. Repeat 10____ times per set. Do ___1_ sets per session. Do _4__ sessions per day.  http://orth.exer.us/1086   Copyright  VHI. All rights reserved.  Getting Into / Out of Bed    Lower self to lie down on one side by raising legs and lowering head at the same time. Use arms to assist moving without twisting. Bend both knees to roll onto back if desired. To sit up, start from lying on side, and use same move-ments in reverse. Keep trunk aligned with legs.   Copyright  VHI. All rights reserved.

## 2017-05-16 ENCOUNTER — Telehealth (HOSPITAL_COMMUNITY): Payer: Self-pay | Admitting: Physical Therapy

## 2017-05-16 DIAGNOSIS — M5136 Other intervertebral disc degeneration, lumbar region: Secondary | ICD-10-CM | POA: Diagnosis not present

## 2017-05-16 DIAGNOSIS — M47816 Spondylosis without myelopathy or radiculopathy, lumbar region: Secondary | ICD-10-CM | POA: Diagnosis not present

## 2017-05-16 NOTE — Telephone Encounter (Signed)
Pt requested to be put on hold, after her two sessions she went to the MD and he is waiting to get a shot approved thru [employer]. IF she thought that our services had anything to do with her condition today she would never come back/ will call us back to r/s if she needs PT again. Please put her on hold.

## 2017-05-17 ENCOUNTER — Ambulatory Visit (HOSPITAL_COMMUNITY): Payer: PPO

## 2017-05-21 ENCOUNTER — Telehealth: Payer: Self-pay | Admitting: *Deleted

## 2017-05-21 ENCOUNTER — Encounter (HOSPITAL_COMMUNITY): Payer: PPO

## 2017-05-21 NOTE — Telephone Encounter (Signed)
Left message that I called and would be glad  to see her tomorrow if she wants

## 2017-05-23 ENCOUNTER — Encounter: Payer: Self-pay | Admitting: Adult Health

## 2017-05-23 ENCOUNTER — Ambulatory Visit (INDEPENDENT_AMBULATORY_CARE_PROVIDER_SITE_OTHER): Payer: PPO | Admitting: Adult Health

## 2017-05-23 VITALS — BP 132/72 | HR 64 | Ht 62.0 in | Wt 155.0 lb

## 2017-05-23 DIAGNOSIS — I6523 Occlusion and stenosis of bilateral carotid arteries: Secondary | ICD-10-CM | POA: Diagnosis not present

## 2017-05-23 DIAGNOSIS — N898 Other specified noninflammatory disorders of vagina: Secondary | ICD-10-CM | POA: Diagnosis not present

## 2017-05-23 DIAGNOSIS — M7918 Myalgia, other site: Secondary | ICD-10-CM

## 2017-05-23 DIAGNOSIS — Z1231 Encounter for screening mammogram for malignant neoplasm of breast: Secondary | ICD-10-CM | POA: Diagnosis not present

## 2017-05-23 DIAGNOSIS — R300 Dysuria: Secondary | ICD-10-CM | POA: Diagnosis not present

## 2017-05-23 DIAGNOSIS — I10 Essential (primary) hypertension: Secondary | ICD-10-CM | POA: Diagnosis not present

## 2017-05-23 DIAGNOSIS — Z1389 Encounter for screening for other disorder: Secondary | ICD-10-CM | POA: Diagnosis not present

## 2017-05-23 DIAGNOSIS — R319 Hematuria, unspecified: Secondary | ICD-10-CM

## 2017-05-23 DIAGNOSIS — M791 Myalgia: Secondary | ICD-10-CM

## 2017-05-23 LAB — POCT URINALYSIS DIPSTICK
Glucose, UA: NEGATIVE
KETONES UA: NEGATIVE
Nitrite, UA: NEGATIVE
PROTEIN UA: NEGATIVE

## 2017-05-23 MED ORDER — CIPROFLOXACIN HCL 500 MG PO TABS: 500.0000 mg | ORAL_TABLET | Freq: Two times a day (BID) | ORAL | 0 refills | Status: DC

## 2017-05-23 NOTE — Progress Notes (Signed)
Subjective:     Patient ID: Jo Parrish, female   DOB: 1926/02/28, 83 y.o.   MRN: [id number]  HPI Jo Parrish is a 83 year old white female,in complaining of pain in right buttock for months and had negative xray, and tender under right under arm and burning with urination.She has seen Dr Gerarda Fraction and and seeing Dr Nelva Bush in [place]. She is active, mows her yard, still works 8 hours a week and dances 2-3 x a week when feel like it. She says she has had injections in back and was better and then had PT and was worse, so stopped PT, and is currently awaiting approval for another injection in back.   Review of Systems Burning with urination Vaginal irritation Pain in right buttock Tender under right arm     Objective:   Physical Exam BP 132/72 (BP Location: Left Arm, Patient Position: Sitting, Cuff Size: Normal)   Pulse 64   Ht 5\' 2"  (1.575 m)   Wt 155 lb (70.3 kg)   BMI 28.35 kg/m   Urine dipstick: trace leuks and Skin warm and dry,  Breasts:no dominate palpable mass, retraction or nipple discharge. No masses under arms but right underarm tender. Pelvic: external genitalia is normal in appearance no lesions, vagina:is pale with loss of color, moisture and rugae,urethra has no lesions or masses noted, cervix:smooth and atrophic , uterus: normal size, shape and contour, non tender, no masses felt, adnexa: no masses or tenderness noted. Bladder is non tender and no masses felt. Has point tenderness under right buttock.     Assessment:       1. Burning with urination   2. Hematuria, unspecified type   3. Vaginal irritation   4. Pain in right buttock       Plan:     Rx Cipro 500 mg 1 bid x 3 days UA C&S sent Use Luvena prn  F/U with Dr Nelva Bush,( I feel it is back related) Get mammogram,( for her piece of mine) Follow up prn

## 2017-05-24 LAB — URINALYSIS, ROUTINE W REFLEX MICROSCOPIC
Bilirubin, UA: NEGATIVE
GLUCOSE, UA: NEGATIVE
Ketones, UA: NEGATIVE
Leukocytes, UA: NEGATIVE
NITRITE UA: NEGATIVE
PH UA: 6.5 (ref 5.0–7.5)
Protein, UA: NEGATIVE
RBC, UA: NEGATIVE
Specific Gravity, UA: 1.021 (ref 1.005–1.030)
UUROB: 0.2 mg/dL (ref 0.2–1.0)

## 2017-05-27 ENCOUNTER — Encounter (HOSPITAL_COMMUNITY): Payer: PPO | Admitting: Physical Therapy

## 2017-05-29 ENCOUNTER — Telehealth: Payer: Self-pay | Admitting: Adult Health

## 2017-05-29 LAB — URINE CULTURE

## 2017-05-29 MED ORDER — NITROFURANTOIN MONOHYD MACRO 100 MG PO CAPS: 100.0000 mg | ORAL_CAPSULE | Freq: Two times a day (BID) | ORAL | 0 refills | Status: DC

## 2017-05-29 NOTE — Telephone Encounter (Signed)
Left message that urine culture was +and is resistant to Cipro, sent Rx for macrobid to [facility] in 

## 2017-05-30 ENCOUNTER — Encounter (HOSPITAL_COMMUNITY): Payer: PPO | Admitting: Physical Therapy

## 2017-06-04 ENCOUNTER — Encounter (HOSPITAL_COMMUNITY): Payer: PPO | Admitting: Physical Therapy

## 2017-06-05 DIAGNOSIS — M545 Low back pain: Secondary | ICD-10-CM | POA: Diagnosis not present

## 2017-06-06 ENCOUNTER — Encounter (HOSPITAL_COMMUNITY): Payer: PPO

## 2017-06-11 ENCOUNTER — Encounter (HOSPITAL_COMMUNITY): Payer: PPO | Admitting: Physical Therapy

## 2017-06-11 DIAGNOSIS — E538 Deficiency of other specified B group vitamins: Secondary | ICD-10-CM | POA: Diagnosis not present

## 2017-06-13 ENCOUNTER — Encounter (HOSPITAL_COMMUNITY): Payer: PPO | Admitting: Physical Therapy

## 2017-06-18 ENCOUNTER — Encounter (HOSPITAL_COMMUNITY): Payer: PPO | Admitting: Physical Therapy

## 2017-06-20 ENCOUNTER — Encounter (HOSPITAL_COMMUNITY): Payer: PPO | Admitting: Physical Therapy

## 2017-06-29 DIAGNOSIS — R300 Dysuria: Secondary | ICD-10-CM | POA: Diagnosis not present

## 2017-07-10 DIAGNOSIS — R110 Nausea: Secondary | ICD-10-CM | POA: Diagnosis not present

## 2017-07-31 DIAGNOSIS — H524 Presbyopia: Secondary | ICD-10-CM | POA: Diagnosis not present

## 2017-07-31 DIAGNOSIS — Z961 Presence of intraocular lens: Secondary | ICD-10-CM | POA: Diagnosis not present

## 2017-08-07 DIAGNOSIS — M545 Low back pain: Secondary | ICD-10-CM | POA: Diagnosis not present

## 2017-08-07 DIAGNOSIS — M5136 Other intervertebral disc degeneration, lumbar region: Secondary | ICD-10-CM | POA: Diagnosis not present

## 2017-08-07 DIAGNOSIS — M47816 Spondylosis without myelopathy or radiculopathy, lumbar region: Secondary | ICD-10-CM | POA: Diagnosis not present

## 2017-08-14 DIAGNOSIS — I10 Essential (primary) hypertension: Secondary | ICD-10-CM | POA: Diagnosis not present

## 2017-08-14 DIAGNOSIS — J329 Chronic sinusitis, unspecified: Secondary | ICD-10-CM | POA: Diagnosis not present

## 2017-08-14 DIAGNOSIS — Z6828 Body mass index (BMI) 28.0-28.9, adult: Secondary | ICD-10-CM | POA: Diagnosis not present

## 2017-08-14 DIAGNOSIS — E538 Deficiency of other specified B group vitamins: Secondary | ICD-10-CM | POA: Diagnosis not present

## 2017-08-14 DIAGNOSIS — G894 Chronic pain syndrome: Secondary | ICD-10-CM | POA: Diagnosis not present

## 2017-09-26 DIAGNOSIS — M5136 Other intervertebral disc degeneration, lumbar region: Secondary | ICD-10-CM | POA: Diagnosis not present

## 2017-10-02 DIAGNOSIS — D510 Vitamin B12 deficiency anemia due to intrinsic factor deficiency: Secondary | ICD-10-CM | POA: Diagnosis not present

## 2017-10-10 ENCOUNTER — Telehealth: Payer: Self-pay | Admitting: Adult Health

## 2017-10-10 MED ORDER — NITROFURANTOIN MONOHYD MACRO 100 MG PO CAPS: 100.0000 mg | ORAL_CAPSULE | Freq: Two times a day (BID) | ORAL | 0 refills | Status: DC

## 2017-10-10 NOTE — Telephone Encounter (Signed)
Left message I sent rx for Macrobid to [facility], if not better will need to see

## 2017-10-17 ENCOUNTER — Other Ambulatory Visit (INDEPENDENT_AMBULATORY_CARE_PROVIDER_SITE_OTHER): Payer: PPO

## 2017-10-17 DIAGNOSIS — N390 Urinary tract infection, site not specified: Secondary | ICD-10-CM

## 2017-10-17 LAB — POCT URINALYSIS DIPSTICK
Glucose, UA: NEGATIVE
KETONES UA: NEGATIVE
Leukocytes, UA: NEGATIVE
Nitrite, UA: NEGATIVE
PROTEIN UA: NEGATIVE
RBC UA: NEGATIVE

## 2017-10-17 NOTE — Progress Notes (Unsigned)
PT resent treated UTI. Urine dip today was negative.Urine culture sent at patient request. Pad CMA

## 2017-10-19 LAB — URINE CULTURE

## 2017-10-21 ENCOUNTER — Telehealth: Payer: Self-pay | Admitting: Adult Health

## 2017-10-21 NOTE — Telephone Encounter (Signed)
Informed patient urine culture was normal.

## 2017-10-23 ENCOUNTER — Ambulatory Visit: Payer: PPO | Admitting: Cardiovascular Disease

## 2017-10-23 ENCOUNTER — Encounter: Payer: Self-pay | Admitting: Cardiovascular Disease

## 2017-10-23 DIAGNOSIS — I5032 Chronic diastolic (congestive) heart failure: Secondary | ICD-10-CM

## 2017-10-23 DIAGNOSIS — R0602 Shortness of breath: Secondary | ICD-10-CM

## 2017-10-23 DIAGNOSIS — E7800 Pure hypercholesterolemia, unspecified: Secondary | ICD-10-CM | POA: Diagnosis not present

## 2017-10-23 DIAGNOSIS — I10 Essential (primary) hypertension: Secondary | ICD-10-CM | POA: Diagnosis not present

## 2017-10-23 NOTE — Patient Instructions (Signed)
Medication Instructions:   NO CHANGE  Testing/Procedures:  Your physician has requested that you have a lexiscan myoview. For further information please visit [email or web address]. Please follow instruction sheet, as given.   Your physician has requested that you have an echocardiogram. Echocardiography is a painless test that uses sound waves to create images of your heart. It provides your doctor with information about the size and shape of your heart and how well your heart's chambers and valves are working. This procedure takes approximately one hour. There are no restrictions for this procedure.    Follow-Up:  Your physician wants you to follow-up in: [place] will receive a reminder letter in the mail two months in advance. If you don't receive a letter, please call our office to schedule the follow-up appointment.   If you need a refill on your cardiac medications before your next appointment, please call your pharmacy.

## 2017-10-23 NOTE — Progress Notes (Signed)
10/23/2017 Jo Parrish   August 04, 1926  [id number]  Primary Physician Redmond School, MD Primary Cardiologist: Lorretta Harp MD [name], [place]  HPI:  Jo Parrish is a 83 y.o. female mildly overweight widowed Caucasian female mother of 1 child who I last saw in the office approximately  11/02/16  She does admit to dietary indiscretion and has had lower extremity edema and spikes in blood pressure related to this. Her last 2-D echo revealed an EF of 45-50% with grade 1 diastolic dysfunction. She otherwise denies chest pain or shortness of breath. I did clear her for right total knee replacement with Dr. Moshe Salisbury, and she has since switched surgeons to Dr. Gaynelle Arabian who performed the surgery successfully. She was seen in the emergency room at [facility] on 12/05/14 with some chest pain and blood pressure of 172/94 which she attributes to having had salty green beans earlier that day. She is very precise and quantitative with measuring her blood pressure on a daily basis and taking when necessary amlodipine and atenolol with blood pressures that are "out of range". She has intermittent left bundle branch block. Since she was here 12 months ago she has been asymptomatic. She is very active and dances several times a week. She also has a black convertible mustang named  [name]. Since I saw her a year ago she's done well. She does relate increasing dyspnea on exertion with activity but denies chest pain.     Current Meds  Medication Sig  . amLODipine (NORVASC) 5 MG tablet Take 1 tablet (5 mg total) by mouth 2 (two) times daily.  [place] Kitchen aspirin 81 MG tablet Take 81 mg every other day by mouth.  [place] Kitchen atenolol (TENORMIN) 25 MG tablet Take 25 mg by mouth daily.   [place] Kitchen ezetimibe (ZETIA) 10 MG tablet Take 1 tablet (10 mg total) by mouth at bedtime.  . furosemide (LASIX) 20 MG tablet Take 20 mg daily if needed for swelling  . irbesartan (AVAPRO) 300 MG tablet Take 300 mg by mouth every  morning.   [place] Kitchen levothyroxine (SYNTHROID, LEVOTHROID) 88 MCG tablet Take 88 mcg by mouth daily before breakfast.   . Naproxen Sodium (ALEVE PO) Take by mouth as needed.  . nitrofurantoin, macrocrystal-monohydrate, (MACROBID) 100 MG capsule Take 1 capsule (100 mg total) by mouth 2 (two) times daily.  [place] Kitchen omeprazole (PRILOSEC) 40 MG capsule Take 40 mg by mouth every morning.   . polyethylene glycol powder (GLYCOLAX) powder Take 17 g by mouth daily.   Mar[place] Kitchen UNABLE TO FIND Injection for bones-once a year  . VITAMIN B1-B12 IJ Inject as directed every 30 (thirty) days.     Allergies  Allergen Reactions  . Amoxicillin     Mouth sores  . Levaquin [Levofloxacin In D5w]     Mouth sores  . Statins Other (See Comments)    Leg problems  . Sulfa Antibiotics Other (See Comments)    Reaction unknown  . Fenofibrate Micronized Other (See Comments)    unknown  . Tizanidine Other (See Comments)    Dry mouth, slurred speech    Social History   Socioeconomic History  . Marital status: Widowed    Spouse name: Not on file  . Number of children: Not on file  . Years of education: Not on file  . Highest education level: Not on file  Social Needs  . Financial resource strain: Not on file  . Food insecurity - worry: Not on file  .  Food insecurity - inability: Not on file  . Transportation needs - medical: Not on file  . Transportation needs - non-medical: Not on file  Occupational History  . Occupation: [employer]  Tobacco Use  . Smoking status: Never Smoker  . Smokeless tobacco: Never Used  Substance and Sexual Activity  . Alcohol use: No  . Drug use: No  . Sexual activity: Not Currently    Birth control/protection: Post-menopausal  Other Topics Concern  . Not on file  Social History Narrative  . Not on file     Review of Systems: General: negative for chills, fever, night sweats or weight changes.  Cardiovascular: negative for chest pain, dyspnea on exertion, edema, orthopnea, palpitations,  paroxysmal nocturnal dyspnea or shortness of breath Dermatological: negative for rash Respiratory: negative for cough or wheezing Urologic: negative for hematuria Abdominal: negative for nausea, vomiting, diarrhea, bright red blood per rectum, melena, or hematemesis Neurologic: negative for visual changes, syncope, or dizziness All other systems reviewed and are otherwise negative except as noted above.    Blood pressure 136/78, pulse 68, height 5' 2.5" (1.588 m), weight 161 lb (73 kg).  General appearance: alert and no distress Neck: no adenopathy, no carotid bruit, no JVD, supple, symmetrical, trachea midline and thyroid not enlarged, symmetric, no tenderness/mass/nodules Lungs: clear to auscultation bilaterally Heart: regular rate and rhythm, S1, S2 normal, no murmur, click, rub or gallop Extremities: extremities normal, atraumatic, no cyanosis or edema Pulses: 2+ and symmetric Skin: Skin color, texture, turgor normal. No rashes or lesions Neurologic: Alert and oriented X 3, normal strength and tone. Normal symmetric reflexes. Normal coordination and gait  EKG sinus rhythm at 69 with inferolateral Q waves. I personally reviewed this EKG.  ASSESSMENT AND PLAN:   Chronic diastolic heart failure Chronic diastolic heart failure by low-dose oral diuretic with 2-D echo performed 12/29/12 revealing an ejection fraction of 45-50% with grade 1 diastolic dysfunction, mild to moderate aortic insufficiency with an LVEDD of 50-53 mm. She does say that she had increasing shortness of breath over the last year while doing activity. I'm going to repeat a 2-D echocardiogram.  Essential hypertension History of hypertension or blood pressure measured today at 136/78. She is on amlodipine, atenolol and Avapro. Continue current meds are Cardizem  Hyperlipidemia History of hyperlipidemia on Zetia followed by her PCP      Lorretta Harp MD [facility], [facility] 10/23/2017 9:23 AM

## 2017-10-23 NOTE — Assessment & Plan Note (Signed)
Chronic diastolic heart failure by low-dose oral diuretic with 2-D echo performed 12/29/12 revealing an ejection fraction of 45-50% with grade 1 diastolic dysfunction, mild to moderate aortic insufficiency with an LVEDD of 50-53 mm. She does say that she had increasing shortness of breath over the last year while doing activity. I'm going to repeat a 2-D echocardiogram.

## 2017-10-23 NOTE — Assessment & Plan Note (Signed)
History of hypertension or blood pressure measured today at 136/78. She is on amlodipine, atenolol and Avapro. Continue current meds are Cardizem

## 2017-10-23 NOTE — Assessment & Plan Note (Signed)
History of hyperlipidemia on Zetia followed by her PCP 

## 2017-11-05 ENCOUNTER — Other Ambulatory Visit: Payer: Self-pay

## 2017-11-05 ENCOUNTER — Ambulatory Visit (HOSPITAL_COMMUNITY): Payer: PPO | Attending: Cardiology

## 2017-11-05 DIAGNOSIS — R0602 Shortness of breath: Secondary | ICD-10-CM

## 2017-11-05 DIAGNOSIS — E785 Hyperlipidemia, unspecified: Secondary | ICD-10-CM | POA: Insufficient documentation

## 2017-11-05 DIAGNOSIS — I110 Hypertensive heart disease with heart failure: Secondary | ICD-10-CM | POA: Insufficient documentation

## 2017-11-05 DIAGNOSIS — I509 Heart failure, unspecified: Secondary | ICD-10-CM | POA: Diagnosis not present

## 2017-11-13 ENCOUNTER — Other Ambulatory Visit: Payer: Self-pay | Admitting: Cardiovascular Disease

## 2017-11-13 ENCOUNTER — Encounter (HOSPITAL_COMMUNITY): Payer: PPO

## 2017-11-13 DIAGNOSIS — I351 Nonrheumatic aortic (valve) insufficiency: Secondary | ICD-10-CM

## 2017-11-15 ENCOUNTER — Telehealth (HOSPITAL_COMMUNITY): Payer: Self-pay

## 2017-11-15 NOTE — Telephone Encounter (Signed)
Encounter complete. 

## 2017-11-20 ENCOUNTER — Ambulatory Visit (HOSPITAL_COMMUNITY)
Admission: RE | Admit: 2017-11-20 | Discharge: 2017-11-20 | Disposition: A | Discharge status: Home / Self Care | Payer: PPO | Source: Ambulatory Visit | Attending: Cardiovascular Disease | Admitting: Cardiovascular Disease

## 2017-11-20 DIAGNOSIS — I509 Heart failure, unspecified: Secondary | ICD-10-CM | POA: Diagnosis not present

## 2017-11-20 DIAGNOSIS — I10 Essential (primary) hypertension: Secondary | ICD-10-CM | POA: Insufficient documentation

## 2017-11-20 DIAGNOSIS — Z8249 Family history of ischemic heart disease and other diseases of the circulatory system: Secondary | ICD-10-CM | POA: Insufficient documentation

## 2017-11-20 DIAGNOSIS — R5383 Other fatigue: Secondary | ICD-10-CM | POA: Insufficient documentation

## 2017-11-20 DIAGNOSIS — R0602 Shortness of breath: Secondary | ICD-10-CM

## 2017-11-20 DIAGNOSIS — I110 Hypertensive heart disease with heart failure: Secondary | ICD-10-CM | POA: Insufficient documentation

## 2017-11-20 DIAGNOSIS — I447 Left bundle-branch block, unspecified: Secondary | ICD-10-CM | POA: Insufficient documentation

## 2017-11-20 LAB — MYOCARDIAL PERFUSION IMAGING
CHL CUP NUCLEAR SSS: 13
CSEPPHR: 64 {beats}/min
LVDIAVOL: 93 mL (ref 46–106)
LVSYSVOL: 42 mL
Rest HR: 63 {beats}/min
SDS: 7
SRS: 6
TID: 1.08

## 2017-11-20 MED ORDER — TECHNETIUM TC 99M TETROFOSMIN IV KIT
32.1000 | PACK | Freq: Once | INTRAVENOUS | Status: AC | PRN
  Administered 2017-11-20: 32.1 via INTRAVENOUS
  Filled 2017-11-20: qty 33

## 2017-11-20 MED ORDER — REGADENOSON 0.4 MG/5ML IV SOLN
0.4000 mg | Freq: Once | INTRAVENOUS | Status: AC
  Administered 2017-11-20: 0.4 mg via INTRAVENOUS

## 2017-11-20 MED ORDER — TECHNETIUM TC 99M TETROFOSMIN IV KIT
10.8000 | PACK | Freq: Once | INTRAVENOUS | Status: AC | PRN
  Administered 2017-11-20: 10.8 via INTRAVENOUS
  Filled 2017-11-20: qty 11

## 2017-11-28 DIAGNOSIS — M545 Low back pain: Secondary | ICD-10-CM | POA: Diagnosis not present

## 2017-11-28 DIAGNOSIS — M5136 Other intervertebral disc degeneration, lumbar region: Secondary | ICD-10-CM | POA: Diagnosis not present

## 2017-12-18 DIAGNOSIS — R600 Localized edema: Secondary | ICD-10-CM | POA: Diagnosis not present

## 2017-12-18 DIAGNOSIS — E538 Deficiency of other specified B group vitamins: Secondary | ICD-10-CM | POA: Diagnosis not present

## 2017-12-18 DIAGNOSIS — M549 Dorsalgia, unspecified: Secondary | ICD-10-CM | POA: Diagnosis not present

## 2017-12-18 DIAGNOSIS — Z6829 Body mass index (BMI) 29.0-29.9, adult: Secondary | ICD-10-CM | POA: Diagnosis not present

## 2017-12-18 DIAGNOSIS — M2550 Pain in unspecified joint: Secondary | ICD-10-CM | POA: Diagnosis not present

## 2017-12-18 DIAGNOSIS — M1991 Primary osteoarthritis, unspecified site: Secondary | ICD-10-CM | POA: Diagnosis not present

## 2017-12-18 DIAGNOSIS — E063 Autoimmune thyroiditis: Secondary | ICD-10-CM | POA: Diagnosis not present

## 2017-12-18 DIAGNOSIS — E663 Overweight: Secondary | ICD-10-CM | POA: Diagnosis not present

## 2017-12-18 DIAGNOSIS — Z1389 Encounter for screening for other disorder: Secondary | ICD-10-CM | POA: Diagnosis not present

## 2018-01-08 DIAGNOSIS — E782 Mixed hyperlipidemia: Secondary | ICD-10-CM | POA: Diagnosis not present

## 2018-01-08 DIAGNOSIS — G894 Chronic pain syndrome: Secondary | ICD-10-CM | POA: Diagnosis not present

## 2018-01-08 DIAGNOSIS — E538 Deficiency of other specified B group vitamins: Secondary | ICD-10-CM | POA: Diagnosis not present

## 2018-01-08 DIAGNOSIS — Z6828 Body mass index (BMI) 28.0-28.9, adult: Secondary | ICD-10-CM | POA: Diagnosis not present

## 2018-01-08 DIAGNOSIS — I10 Essential (primary) hypertension: Secondary | ICD-10-CM | POA: Diagnosis not present

## 2018-01-08 DIAGNOSIS — Z0001 Encounter for general adult medical examination with abnormal findings: Secondary | ICD-10-CM | POA: Diagnosis not present

## 2018-01-09 ENCOUNTER — Telehealth: Payer: Self-pay | Admitting: *Deleted

## 2018-01-09 DIAGNOSIS — M5136 Other intervertebral disc degeneration, lumbar region: Secondary | ICD-10-CM | POA: Diagnosis not present

## 2018-01-09 NOTE — Telephone Encounter (Signed)
Patient stop by office had a question in regards to  Irbesartan recall  Left message to call local pharmacy - to found out if the medication is on recall-- and the medication can be switch to non recall batch.  any question may call back.

## 2018-01-17 DIAGNOSIS — E2839 Other primary ovarian failure: Secondary | ICD-10-CM | POA: Diagnosis not present

## 2018-01-17 DIAGNOSIS — Z6828 Body mass index (BMI) 28.0-28.9, adult: Secondary | ICD-10-CM | POA: Diagnosis not present

## 2018-01-17 DIAGNOSIS — E663 Overweight: Secondary | ICD-10-CM | POA: Diagnosis not present

## 2018-01-29 DIAGNOSIS — I5032 Chronic diastolic (congestive) heart failure: Secondary | ICD-10-CM | POA: Diagnosis not present

## 2018-01-29 DIAGNOSIS — L209 Atopic dermatitis, unspecified: Secondary | ICD-10-CM | POA: Diagnosis not present

## 2018-01-29 DIAGNOSIS — E063 Autoimmune thyroiditis: Secondary | ICD-10-CM | POA: Diagnosis not present

## 2018-01-29 DIAGNOSIS — T7840XA Allergy, unspecified, initial encounter: Secondary | ICD-10-CM | POA: Diagnosis not present

## 2018-01-29 DIAGNOSIS — R5383 Other fatigue: Secondary | ICD-10-CM | POA: Diagnosis not present

## 2018-01-29 DIAGNOSIS — E663 Overweight: Secondary | ICD-10-CM | POA: Diagnosis not present

## 2018-01-29 DIAGNOSIS — Z6828 Body mass index (BMI) 28.0-28.9, adult: Secondary | ICD-10-CM | POA: Diagnosis not present

## 2018-02-20 ENCOUNTER — Encounter (HOSPITAL_COMMUNITY): Admission: RE | Admit: 2018-02-20 | Payer: PPO | Source: Ambulatory Visit

## 2018-02-21 ENCOUNTER — Encounter (HOSPITAL_COMMUNITY)
Admission: RE | Admit: 2018-02-21 | Discharge: 2018-02-21 | Disposition: A | Discharge status: Home / Self Care | Payer: PPO | Source: Ambulatory Visit | Attending: Internal Medicine | Admitting: Internal Medicine

## 2018-02-21 ENCOUNTER — Encounter (HOSPITAL_COMMUNITY): Payer: Self-pay

## 2018-02-21 DIAGNOSIS — M810 Age-related osteoporosis without current pathological fracture: Secondary | ICD-10-CM | POA: Insufficient documentation

## 2018-02-21 LAB — RENAL FUNCTION PANEL
ALBUMIN: 4.4 g/dL (ref 3.5–5.0)
ANION GAP: 9 (ref 5–15)
BUN: 21 mg/dL — ABNORMAL HIGH (ref 6–20)
CALCIUM: 9.4 mg/dL (ref 8.9–10.3)
CO2: 25 mmol/L (ref 22–32)
CREATININE: 0.73 mg/dL (ref 0.44–1.00)
Chloride: 102 mmol/L (ref 101–111)
GFR calc Af Amer: >60 mL/min (ref >60)
GFR calc non Af Amer: >60 mL/min (ref >60)
GLUCOSE: 100 mg/dL — AB (ref 65–99)
PHOSPHORUS: 4.1 mg/dL (ref 2.5–4.6)
Potassium: 4.3 mmol/L (ref 3.5–5.1)
SODIUM: 136 mmol/L (ref 135–145)

## 2018-02-21 MED ORDER — ZOLEDRONIC ACID 5 MG/100ML IV SOLN
5.0000 mg | Freq: Once | INTRAVENOUS | Status: AC
  Administered 2018-02-21: 5 mg via INTRAVENOUS

## 2018-02-21 MED ORDER — SODIUM CHLORIDE 0.9 % IV SOLN
INTRAVENOUS | Status: DC
  Administered 2018-02-21: 250 mL via INTRAVENOUS

## 2018-03-07 DIAGNOSIS — D510 Vitamin B12 deficiency anemia due to intrinsic factor deficiency: Secondary | ICD-10-CM | POA: Diagnosis not present

## 2018-03-27 DIAGNOSIS — M5136 Other intervertebral disc degeneration, lumbar region: Secondary | ICD-10-CM | POA: Diagnosis not present

## 2018-04-18 DIAGNOSIS — E538 Deficiency of other specified B group vitamins: Secondary | ICD-10-CM | POA: Diagnosis not present

## 2018-04-24 DIAGNOSIS — I10 Essential (primary) hypertension: Secondary | ICD-10-CM | POA: Diagnosis not present

## 2018-04-24 DIAGNOSIS — Z1389 Encounter for screening for other disorder: Secondary | ICD-10-CM | POA: Diagnosis not present

## 2018-04-24 DIAGNOSIS — G4733 Obstructive sleep apnea (adult) (pediatric): Secondary | ICD-10-CM | POA: Diagnosis not present

## 2018-04-24 DIAGNOSIS — E063 Autoimmune thyroiditis: Secondary | ICD-10-CM | POA: Diagnosis not present

## 2018-04-24 DIAGNOSIS — R5382 Chronic fatigue, unspecified: Secondary | ICD-10-CM | POA: Diagnosis not present

## 2018-04-24 DIAGNOSIS — Z6828 Body mass index (BMI) 28.0-28.9, adult: Secondary | ICD-10-CM | POA: Diagnosis not present

## 2018-04-24 DIAGNOSIS — I5032 Chronic diastolic (congestive) heart failure: Secondary | ICD-10-CM | POA: Diagnosis not present

## 2018-04-24 DIAGNOSIS — E663 Overweight: Secondary | ICD-10-CM | POA: Diagnosis not present

## 2018-04-24 DIAGNOSIS — R197 Diarrhea, unspecified: Secondary | ICD-10-CM | POA: Diagnosis not present

## 2018-06-07 IMAGING — DX DG PELVIS 1-2V
1 series · 1 of 1 positions shown · non-contrast
Comparison: CT scan of the abdomen pelvis of February 27, 2016

CLINICAL DATA: Right buttock pain for 2 months without discrete
episode of injury.

EXAM:
PELVIS - 1-2 VIEW

[pelvis ap]
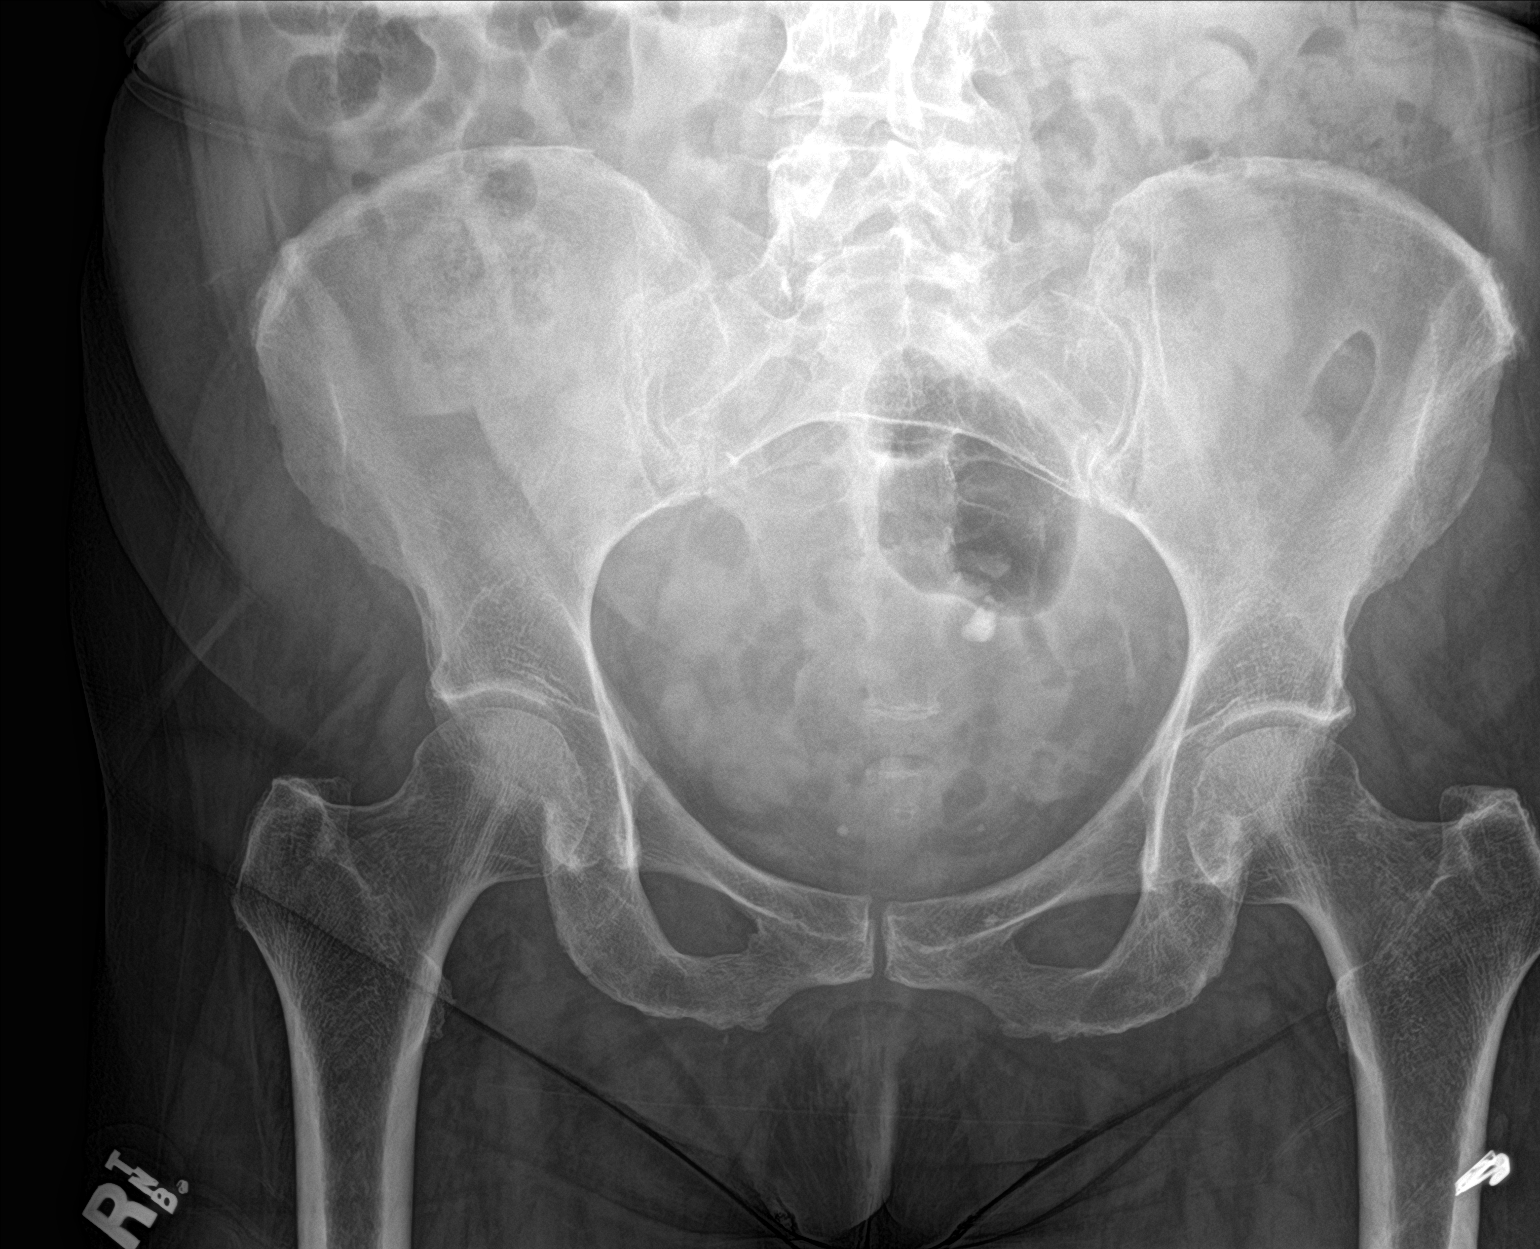

[1 of 1 positions shown; findings below may reference images not displayed]

FINDINGS: The bony pelvis is subjectively osteopenic. There is no lytic or
blastic lesion. There is no acute or old fracture. The sacrum and SI
joints are grossly normal. The hip joint spaces are reasonably well
maintained for age. There is radiodensity to the left of the lower
sacrum compatible with retained barium contrast in a sigmoid
diverticulum.
IMPRESSION: There is no acute or significant chronic bony abnormality of the
pelvis.

## 2018-06-07 IMAGING — DX DG CHEST 2V
2 series · 2 of 2 positions shown · non-contrast
Comparison: PA and lateral chest x-ray February 27, 2016

CLINICAL DATA: Two weeks of nonproductive cough with no other
complaints. History of hypertension, chronic CHF. Nonsmoker.

EXAM:
CHEST  2 VIEW

[chest pa]
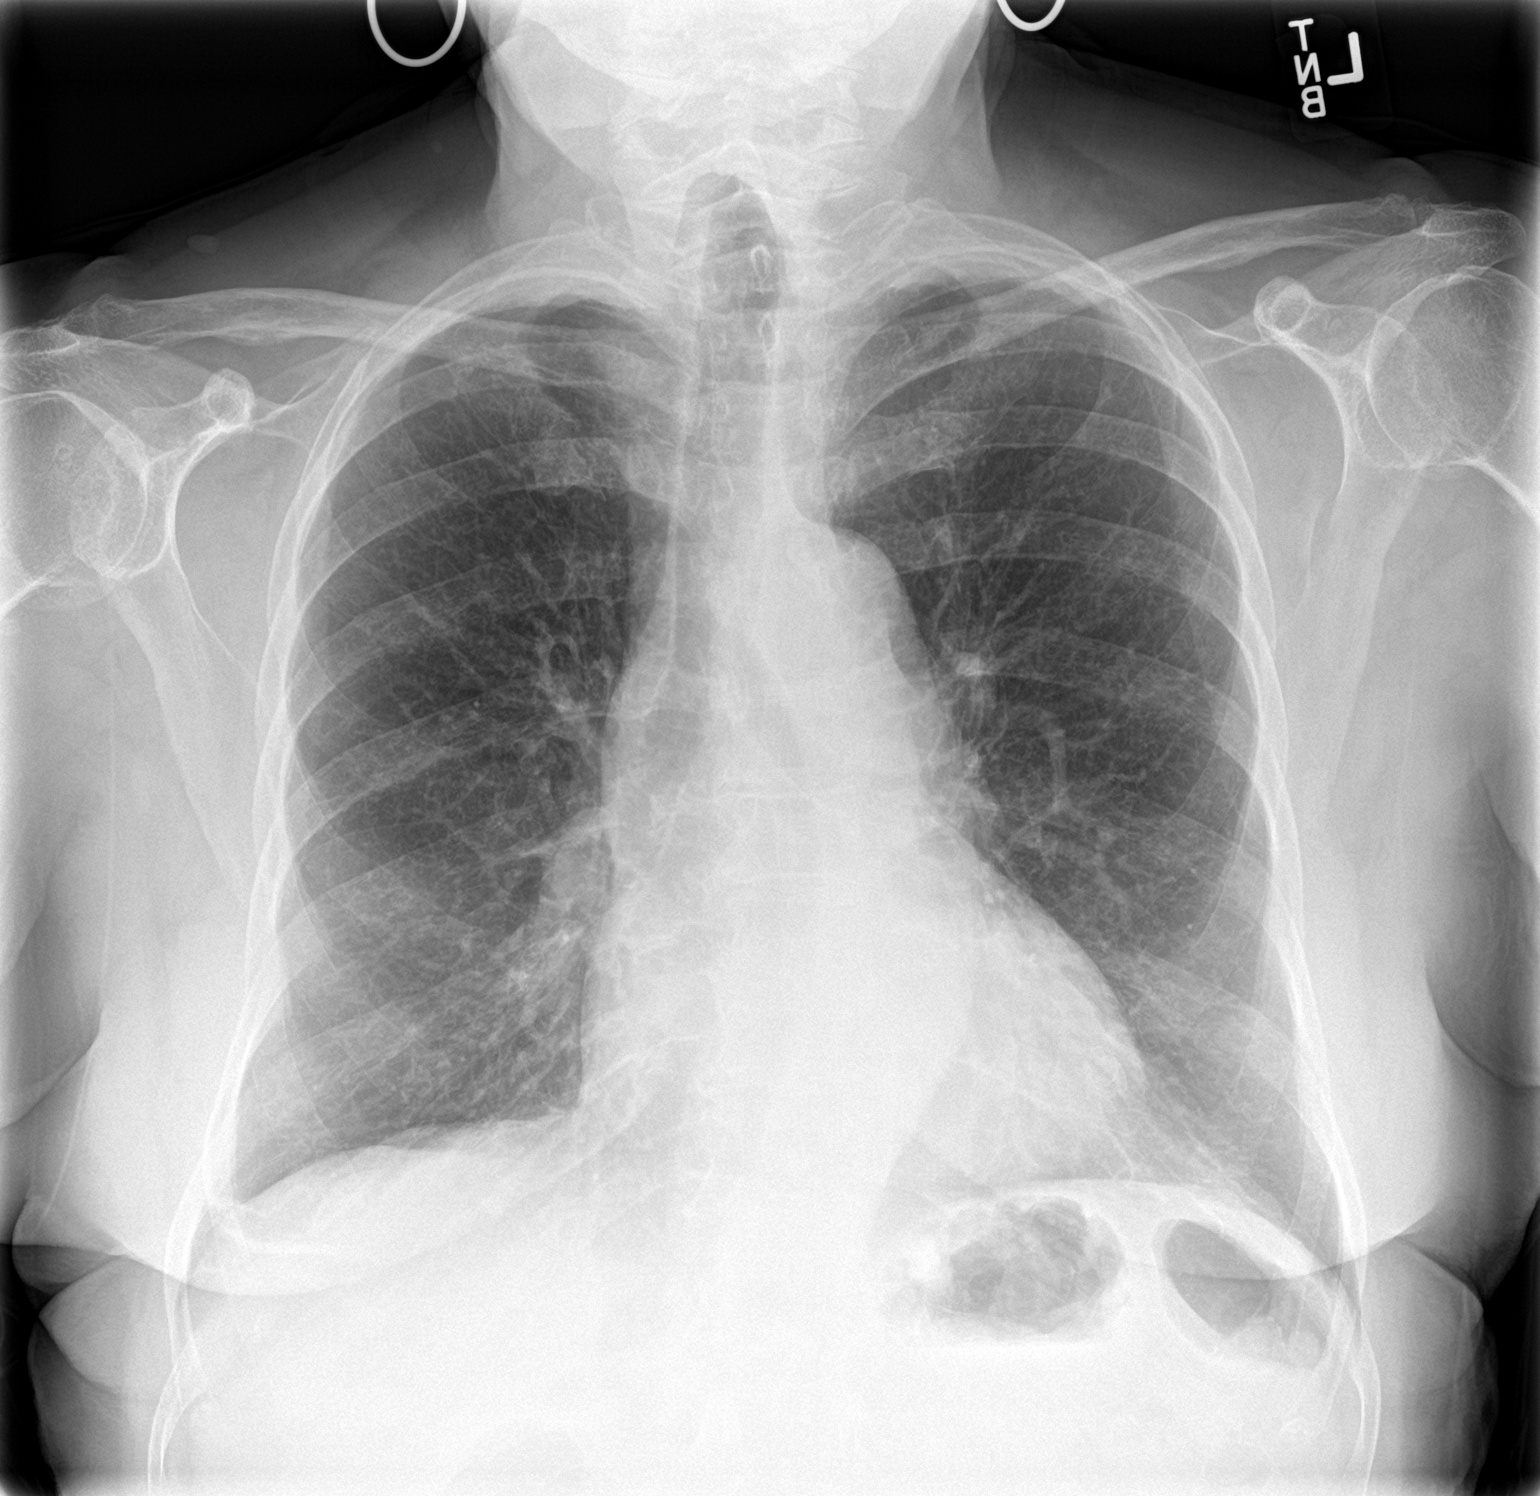

[chest lat]
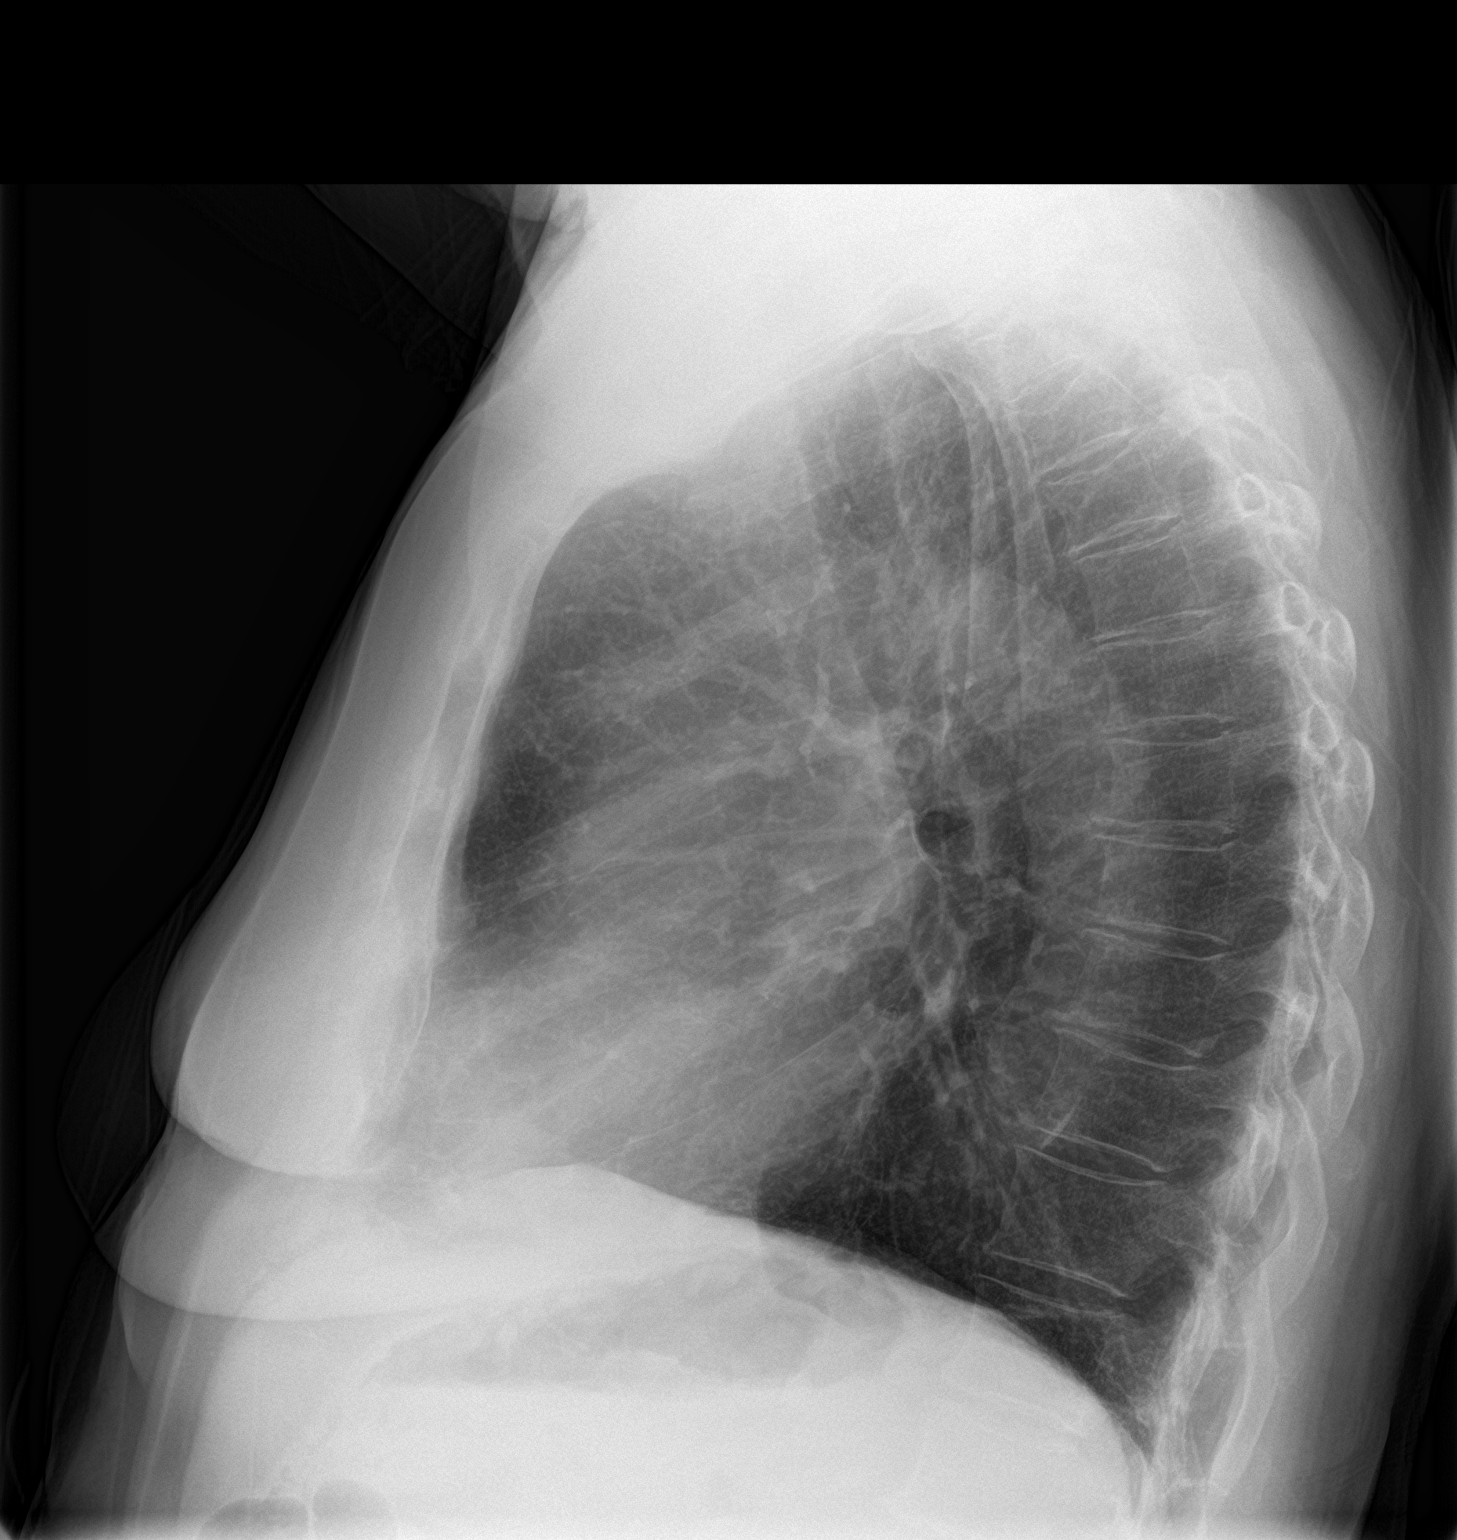

[2 of 2 positions shown; findings below may reference images not displayed]

FINDINGS: The lungs are well-expanded. The interstitial markings are coarse
though stable. The heart and pulmonary vascularity are normal. There
is mild tortuosity of the descending thoracic aorta which exhibits
mural calcification. There is no pleural effusion. The bony thorax
is unremarkable.
IMPRESSION: Chronic bronchitic changes.  No evidence of pneumonia nor CHF.

Thoracic aortic atherosclerosis.

## 2018-06-10 DIAGNOSIS — E538 Deficiency of other specified B group vitamins: Secondary | ICD-10-CM | POA: Diagnosis not present

## 2018-06-11 ENCOUNTER — Encounter (HOSPITAL_COMMUNITY): Payer: Self-pay | Admitting: Emergency Medicine

## 2018-06-11 ENCOUNTER — Other Ambulatory Visit: Payer: Self-pay

## 2018-06-11 ENCOUNTER — Telehealth: Payer: Self-pay | Admitting: Cardiovascular Disease

## 2018-06-11 ENCOUNTER — Emergency Department (HOSPITAL_COMMUNITY): Payer: PPO

## 2018-06-11 ENCOUNTER — Emergency Department (HOSPITAL_COMMUNITY)
Admission: EM | Admit: 2018-06-11 | Discharge: 2018-06-11 | Disposition: A | Discharge status: Home / Self Care | Payer: PPO | Attending: Emergency Medicine | Admitting: Emergency Medicine

## 2018-06-11 DIAGNOSIS — I5032 Chronic diastolic (congestive) heart failure: Secondary | ICD-10-CM | POA: Insufficient documentation

## 2018-06-11 DIAGNOSIS — I110 Hypertensive heart disease with heart failure: Secondary | ICD-10-CM | POA: Diagnosis not present

## 2018-06-11 DIAGNOSIS — Z96651 Presence of right artificial knee joint: Secondary | ICD-10-CM | POA: Diagnosis not present

## 2018-06-11 DIAGNOSIS — R002 Palpitations: Secondary | ICD-10-CM

## 2018-06-11 DIAGNOSIS — Z79899 Other long term (current) drug therapy: Secondary | ICD-10-CM | POA: Diagnosis not present

## 2018-06-11 DIAGNOSIS — E039 Hypothyroidism, unspecified: Secondary | ICD-10-CM | POA: Insufficient documentation

## 2018-06-11 DIAGNOSIS — M069 Rheumatoid arthritis, unspecified: Secondary | ICD-10-CM | POA: Diagnosis not present

## 2018-06-11 DIAGNOSIS — Z7982 Long term (current) use of aspirin: Secondary | ICD-10-CM | POA: Diagnosis not present

## 2018-06-11 LAB — BASIC METABOLIC PANEL
Anion gap: 6 (ref 5–15)
BUN: 17 mg/dL (ref 8–23)
CO2: 26 mmol/L (ref 22–32)
Calcium: 9.1 mg/dL (ref 8.9–10.3)
Chloride: 107 mmol/L (ref 98–111)
Creatinine, Ser: 0.97 mg/dL (ref 0.44–1.00)
GFR calc Af Amer: 58 mL/min — ABNORMAL LOW (ref >60)
GFR calc non Af Amer: 50 mL/min — ABNORMAL LOW (ref >60)
Glucose, Bld: 126 mg/dL — ABNORMAL HIGH (ref 70–99)
Potassium: 3.8 mmol/L (ref 3.5–5.1)
Sodium: 139 mmol/L (ref 135–145)

## 2018-06-11 LAB — CBC
HCT: 38.3 % (ref 36.0–46.0)
Hemoglobin: 12.9 g/dL (ref 12.0–15.0)
MCH: 32.7 pg (ref 26.0–34.0)
MCHC: 33.7 g/dL (ref 30.0–36.0)
MCV: 97.2 fL (ref 78.0–100.0)
Platelets: 191 10*3/uL (ref 150–400)
RBC: 3.94 MIL/uL (ref 3.87–5.11)
RDW: 12.1 % (ref 11.5–15.5)
WBC: 5.4 10*3/uL (ref 4.0–10.5)

## 2018-06-11 LAB — I-STAT TROPONIN, ED: Troponin i, poc: 0 ng/mL (ref 0.00–0.08)

## 2018-06-11 NOTE — Telephone Encounter (Signed)
Spoke with the patient and she is experiencing palpitations with shortness of breath and some dizziness. She says her heart rate is 108. She reports that this is the second episode in the last week. She says that she is very worried about it and wants to be seen today because this is very new for her. After speaking with Dr. Trinna Balloon pt advised to go to ER at [facility] since she lives in [place] and since she does not feel well best to have someone else drive her. Pt agreed and will go ASAP.

## 2018-06-11 NOTE — Telephone Encounter (Signed)
STAT if HR is under 50 or over 120 (normal HR is 60-100 beats per minute)  1) What is your heart rate? 108 2) Do you have a log of your heart rate readings (document readings)? no  3) Do you have any other symptoms? Short of breath a little

## 2018-06-11 NOTE — ED Triage Notes (Signed)
Pt c/o of heart palpitations at 10 am this morning.  Pt took an additional atenolol at 1300.

## 2018-06-16 NOTE — ED Provider Notes (Signed)
[facility] EMERGENCY DEPARTMENT Provider Note   CSN: [id number] Arrival date & time: 06/11/18  1443     History   Chief Complaint Chief Complaint  Patient presents with  . Palpitations    HPI [facility] is a 84 y.o. female.  HPI   84 year old female with palpitations.  Onset around 10 AM this morning.  She took a extra dose of atenolol but symptoms persisted so she came to the emergency room.  They have actually now resolved.  She did check her pulse several times and noted to be in the low 100s.  Denies acute pain or dyspnea.  Past Medical History:  Diagnosis Date  . Bacterial overgrowth syndrome    Small bowel  . Complication of anesthesia   . Constipation   . Cough 09/13/2015  . GERD (gastroesophageal reflux disease)   . Hematuria 09/13/2015  . Hyperlipidemia   . Hypertension   . Hypothyroidism   . Left bundle branch block   . OSA (obstructive sleep apnea)    UNABLE TO TOLERATE  MASK  . PONV (postoperative nausea and vomiting)   . Rheumatoid arthritis(714.0)   . Sjogren's syndrome ([place])   . UI (urinary incontinence) 01/16/2016  . Ulcer, esophagus   . Urinary frequency 07/04/2015  . UTI (lower urinary tract infection) 09/13/2015  . Vaginal atrophy 08/02/2015  . Vaginal burning 07/04/2015  . Vaginal dryness 07/04/2015  . Vulvar itching 08/15/2015  . Wheezing on auscultation 09/13/2015    Patient Active Problem List   Diagnosis Date Noted  . UI (urinary incontinence) 01/16/2016  . UTI (lower urinary tract infection) 09/13/2015  . Hematuria 09/13/2015  . Cough 09/13/2015  . Wheezing on auscultation 09/13/2015  . Vulvar itching 08/15/2015  . Vaginal atrophy 08/02/2015  . Urinary frequency 07/04/2015  . Vaginal burning 07/04/2015  . Vaginal dryness 07/04/2015  . Rash, legs 06/21/2015  . Constipation 01/07/2014  . Anemia 10/20/2013  . Heme positive stool 10/20/2013  . Abnormal LFTs 10/20/2013  . Nausea alone 10/20/2013  . Cellulitis of leg, right 10/16/2013   . Unspecified constipation 10/16/2013  . Postoperative anemia due to acute blood loss 10/12/2013  . Hyponatremia 10/12/2013  . OA (osteoarthritis) of knee 10/09/2013  . Heart palpitations 10/07/2013  . Unspecified hypothyroidism 08/13/2013  . Chronic diastolic heart failure (Hodgkins) 08/06/2013  . Essential hypertension 08/06/2013  . Hyperlipidemia 08/06/2013  . Knee pain 03/03/2013    Past Surgical History:  Procedure Laterality Date  . APPENDECTOMY    . COLONOSCOPY  June 2014   Dr. Earlean Shawl, multiple tubular adenomas removed, internal hemorrhoids. Next colonoscopy in 3 years.  Marland Kitchen DILATION AND CURETTAGE OF UTERUS    . ESOPHAGOGASTRODUODENOSCOPY  06/07/2008    Gastritis. No celiac sprue/Small hiatal hernia  . ESOPHAGOGASTRODUODENOSCOPY  June 2014   Dr. Earlean Shawl, erosive esophagitis, grade B., large hiatal hernia.  . Hydrogen breath test   05/17/2008   small bowel bacterial overgrowth/Hydrogen level rise consistent with small bowel bacterial  . TONSILLECTOMY    . TOTAL KNEE ARTHROPLASTY Right 10/09/2013   Procedure: RIGHT TOTAL KNEE ARTHROPLASTY;  Surgeon: Gearlean Alf, MD;  Location: [facility];  Service: Orthopedics;  Laterality: Right;     OB History    Gravida  1   Para  1   Term  1   Preterm      AB      Living  1     SAB      TAB      Ectopic  Multiple      Live Births  1            Home Medications    Prior to Admission medications   Medication Sig Start Date End Date Taking? Authorizing Provider  amLODipine (NORVASC) 5 MG tablet Take 1 tablet (5 mg total) by mouth 2 (two) times daily. 10/08/14   Lorretta Harp, MD  aspirin 81 MG tablet Take 81 mg every other day by mouth.    [provider]  atenolol (TENORMIN) 25 MG tablet Take 25 mg by mouth daily.     [provider]  ezetimibe (ZETIA) 10 MG tablet Take 1 tablet (10 mg total) by mouth at bedtime. 06/07/16   Barrett, Evelene Croon, PA-C  furosemide (LASIX) 20 MG tablet Take 20 mg  daily if needed for swelling 06/07/16   Barrett, Evelene Croon, PA-C  irbesartan (AVAPRO) 300 MG tablet Take 300 mg by mouth every morning.  07/20/13   [provider]  levothyroxine (SYNTHROID, LEVOTHROID) 88 MCG tablet Take 88 mcg by mouth daily before breakfast.  03/20/17   [provider]  Naproxen Sodium (ALEVE PO) Take by mouth as needed.    [provider]  nitrofurantoin, macrocrystal-monohydrate, (MACROBID) 100 MG capsule Take 1 capsule (100 mg total) by mouth 2 (two) times daily. 10/10/17   Estill Dooms, NP  omeprazole (PRILOSEC) 40 MG capsule Take 40 mg by mouth every morning.  07/04/13   [provider]  polyethylene glycol powder (GLYCOLAX) powder Take 17 g by mouth daily.     [provider]  UNABLE TO FIND Injection for bones-once a year    [provider]  VITAMIN B1-B12 IJ Inject as directed every 30 (thirty) days.    [provider]    Family History Family History  Problem Relation Age of Onset  . Stroke Mother   . Heart disease Father   . Liver disease Father   . COPD Son   . Sleep apnea Son   . Stroke Maternal Grandmother     Social History Social History   Tobacco Use  . Smoking status: Never Smoker  . Smokeless tobacco: Never Used  Substance Use Topics  . Alcohol use: No  . Drug use: No     Allergies   Amoxicillin; Levaquin [levofloxacin in d5w]; Statins; Sulfa antibiotics; Fenofibrate micronized; and Tizanidine   Review of Systems Review of Systems  All systems reviewed and negative, other than as noted in HPI.  Physical Exam Updated Vital Signs BP 134/71 (BP Location: Right Arm)   Pulse 67   Temp 97.9 F (36.6 C) (Oral)   Resp 18   Ht 5\' 2"  (1.575 m)   Wt 71.7 kg (158 lb)   SpO2 97%   BMI 28.90 kg/m   Physical Exam  Constitutional: She appears well-developed and well-nourished. No distress.  HENT:  Head: Normocephalic and atraumatic.  Eyes: Conjunctivae are normal. Right eye  exhibits no discharge. Left eye exhibits no discharge.  Neck: Neck supple.  Cardiovascular: Normal rate, regular rhythm and normal heart sounds. Exam reveals no gallop and no friction rub.  No murmur heard. Pulmonary/Chest: Effort normal and breath sounds normal. No respiratory distress.  Abdominal: Soft. She exhibits no distension. There is no tenderness.  Musculoskeletal: She exhibits no edema or tenderness.  Neurological: She is alert.  Skin: Skin is warm and dry.  Psychiatric: She has a normal mood and affect. Her behavior is normal. Thought content normal.  Nursing note  and vitals reviewed.    ED Treatments / Results  Labs (all labs ordered are listed, but only abnormal results are displayed) Labs Reviewed  BASIC METABOLIC PANEL - Abnormal; Notable for the following components:      Result Value   Glucose, Bld 126 (*)    GFR calc non Af Amer 50 (*)    GFR calc Af Amer 58 (*)    All other components within normal limits  CBC  I-STAT TROPONIN, ED    EKG EKG Interpretation  Date/Time:  Wednesday June 11 2018 15:04:49 EDT Ventricular Rate:  82 PR Interval:    QRS Duration: 143 QT Interval:  387 QTC Calculation: 452 R Axis:   48 Text Interpretation:  Sinus rhythm Left bundle branch block Confirmed by Virgel Manifold [phone]) on 06/11/2018 4:43:53 PM   Radiology No results found.  Procedures Procedures (including critical care time)  Medications Ordered in ED Medications - No data to display   Initial Impression / Assessment and Plan / ED Course  I have reviewed the triage vital signs and the nursing notes.  Pertinent labs & imaging results that were available during my care of the patient were reviewed by me and considered in my medical decision making (see chart for details).     Final Clinical Impressions(s) / ED Diagnoses   Final diagnoses:  Palpitations    ED Discharge Orders    None       Virgel Manifold, MD 06/16/18 [phone]

## 2018-06-18 ENCOUNTER — Telehealth: Payer: Self-pay | Admitting: Cardiovascular Disease

## 2018-06-18 NOTE — Telephone Encounter (Signed)
lpmtcb 7/3 

## 2018-06-18 NOTE — Telephone Encounter (Signed)
Spoke with the patient who has concerns over her elevated HR, SOB, mild chest pressure and feet swelling.  She is presently vacationing in [facility] and expects to return Friday by noon.  She would like to make an appt to see Dr Gwenlyn Found or an associate soon.  Please contact her at [phone] to make arrangements.  Thank you.

## 2018-06-18 NOTE — Telephone Encounter (Signed)
New Message   Patient c/o Palpitations:  High priority if patient c/o lightheadedness, shortness of breath, or chest pain  1) How long have you had palpitations/irregular HR/ Afib? Are you having the symptoms now? Since yesterday afternoon around 5pm  2) Are you currently experiencing lightheadedness, SOB or CP? Slight shortness of breath   3) Do you have a history of afib (atrial fibrillation) or irregular heart rhythm? no 4) Have you checked your BP or HR? (document readings if available): HR was 97  5) Are you experiencing any other symptoms?     Patient took atenolol last night. She was also at the hospital on 06/26

## 2018-06-18 NOTE — Telephone Encounter (Signed)
Spoke with pt, Follow up scheduled  

## 2018-06-18 NOTE — Telephone Encounter (Signed)
Sounds like Ms. Filo needs to see me sometime the next several weeks.

## 2018-06-24 ENCOUNTER — Ambulatory Visit (INDEPENDENT_AMBULATORY_CARE_PROVIDER_SITE_OTHER): Payer: PPO | Admitting: Cardiovascular Disease

## 2018-06-24 ENCOUNTER — Encounter: Payer: Self-pay | Admitting: Cardiovascular Disease

## 2018-06-24 VITALS — BP 136/76 | HR 96 | Wt 160.4 lb

## 2018-06-24 DIAGNOSIS — M5136 Other intervertebral disc degeneration, lumbar region: Secondary | ICD-10-CM | POA: Diagnosis not present

## 2018-06-24 DIAGNOSIS — E7800 Pure hypercholesterolemia, unspecified: Secondary | ICD-10-CM

## 2018-06-24 DIAGNOSIS — I10 Essential (primary) hypertension: Secondary | ICD-10-CM | POA: Diagnosis not present

## 2018-06-24 DIAGNOSIS — I5032 Chronic diastolic (congestive) heart failure: Secondary | ICD-10-CM

## 2018-06-24 NOTE — Patient Instructions (Signed)
Your physician wants you to follow-up in: [place] will receive a reminder letter in the mail two months in advance. If you don't receive a letter, please call our office to schedule the follow-up appointment.    If you need a refill on your cardiac medications before your next appointment, please call your pharmacy.

## 2018-06-24 NOTE — Assessment & Plan Note (Signed)
History of hyperlipidemia on Zetia followed by her PCP 

## 2018-06-24 NOTE — Assessment & Plan Note (Signed)
History of chronic diastolic heart failure echo performed 11/05/2017 revealing normal LV systolic function with grade 1 diastolic dysfunction is on diuretics.
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
## 2018-06-24 NOTE — Assessment & Plan Note (Signed)
History of essential hypertension her blood pressure measured today at 136/76.  She is on amlodipine, atenolol and Avapro.  Continue current meds at current dosing.

## 2018-07-29 DIAGNOSIS — E538 Deficiency of other specified B group vitamins: Secondary | ICD-10-CM | POA: Diagnosis not present

## 2018-08-06 DIAGNOSIS — I10 Essential (primary) hypertension: Secondary | ICD-10-CM | POA: Diagnosis not present

## 2018-08-06 DIAGNOSIS — H04123 Dry eye syndrome of bilateral lacrimal glands: Secondary | ICD-10-CM | POA: Diagnosis not present

## 2018-08-06 DIAGNOSIS — R682 Dry mouth, unspecified: Secondary | ICD-10-CM | POA: Diagnosis not present

## 2018-08-06 DIAGNOSIS — Z1389 Encounter for screening for other disorder: Secondary | ICD-10-CM | POA: Diagnosis not present

## 2018-08-06 DIAGNOSIS — Z6827 Body mass index (BMI) 27.0-27.9, adult: Secondary | ICD-10-CM | POA: Diagnosis not present

## 2018-08-06 DIAGNOSIS — K140 Glossitis: Secondary | ICD-10-CM | POA: Diagnosis not present

## 2018-09-04 DIAGNOSIS — E538 Deficiency of other specified B group vitamins: Secondary | ICD-10-CM | POA: Diagnosis not present

## 2018-09-04 DIAGNOSIS — Z23 Encounter for immunization: Secondary | ICD-10-CM | POA: Diagnosis not present

## 2018-09-18 DIAGNOSIS — E663 Overweight: Secondary | ICD-10-CM | POA: Diagnosis not present

## 2018-09-18 DIAGNOSIS — Z6827 Body mass index (BMI) 27.0-27.9, adult: Secondary | ICD-10-CM | POA: Diagnosis not present

## 2018-09-18 DIAGNOSIS — M1991 Primary osteoarthritis, unspecified site: Secondary | ICD-10-CM | POA: Diagnosis not present

## 2018-09-18 DIAGNOSIS — L720 Epidermal cyst: Secondary | ICD-10-CM | POA: Diagnosis not present

## 2018-09-18 DIAGNOSIS — L723 Sebaceous cyst: Secondary | ICD-10-CM | POA: Diagnosis not present

## 2018-09-18 DIAGNOSIS — I10 Essential (primary) hypertension: Secondary | ICD-10-CM | POA: Diagnosis not present

## 2018-10-14 DIAGNOSIS — E538 Deficiency of other specified B group vitamins: Secondary | ICD-10-CM | POA: Diagnosis not present

## 2018-10-20 ENCOUNTER — Other Ambulatory Visit (HOSPITAL_COMMUNITY): Payer: PPO

## 2018-10-21 DIAGNOSIS — M5136 Other intervertebral disc degeneration, lumbar region: Secondary | ICD-10-CM | POA: Diagnosis not present

## 2018-11-03 ENCOUNTER — Ambulatory Visit (HOSPITAL_COMMUNITY)
Admission: RE | Admit: 2018-11-03 | Discharge: 2018-11-03 | Disposition: A | Discharge status: Home / Self Care | Payer: PPO | Source: Ambulatory Visit | Attending: Internal Medicine | Admitting: Internal Medicine

## 2018-11-03 DIAGNOSIS — I351 Nonrheumatic aortic (valve) insufficiency: Secondary | ICD-10-CM | POA: Insufficient documentation

## 2018-11-03 NOTE — Progress Notes (Signed)
*  PRELIMINARY RESULTS* Echocardiogram 2D Echocardiogram has been performed.  [facility] 11/03/2018, 10:29 AM

## 2018-11-04 DIAGNOSIS — M25551 Pain in right hip: Secondary | ICD-10-CM | POA: Diagnosis not present

## 2018-11-04 DIAGNOSIS — M899 Disorder of bone, unspecified: Secondary | ICD-10-CM | POA: Diagnosis not present

## 2018-11-18 DIAGNOSIS — E538 Deficiency of other specified B group vitamins: Secondary | ICD-10-CM | POA: Diagnosis not present

## 2018-12-12 ENCOUNTER — Ambulatory Visit: Payer: PPO | Admitting: Cardiovascular Disease

## 2018-12-12 ENCOUNTER — Encounter: Payer: Self-pay | Admitting: Cardiovascular Disease

## 2018-12-12 DIAGNOSIS — I10 Essential (primary) hypertension: Secondary | ICD-10-CM

## 2018-12-12 DIAGNOSIS — I350 Nonrheumatic aortic (valve) stenosis: Secondary | ICD-10-CM | POA: Diagnosis not present

## 2018-12-12 DIAGNOSIS — E7800 Pure hypercholesterolemia, unspecified: Secondary | ICD-10-CM

## 2018-12-12 NOTE — Assessment & Plan Note (Signed)
Recent 2D echocardiogram performed 11/03/2018 revealed normal LV systolic function mild aortic stenosis and a peak gradient of 17 mmHg.  Do not think this needs to be rechecked in the future given the mild nature of her stenosis and her lack of symptoms.

## 2018-12-12 NOTE — Assessment & Plan Note (Signed)
History of essential hypertension blood pressure measured today at 126/64.  She is on amlodipine atenolol and Avapro.  Continue current meds at current dosing.
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
## 2018-12-12 NOTE — Assessment & Plan Note (Signed)
History of hyperlipidemia on Zetia with lipid profile performed 01/17/2018 revealing cholesterol 210 LDL of 123 and HDL of 51.

## 2018-12-12 NOTE — Patient Instructions (Signed)
Medication Instructions:  Your physician recommends that you continue on your current medications as directed. Please refer to the Current Medication list given to you today.  If you need a refill on your cardiac medications before your next appointment, please call your pharmacy.   Lab work: NONE If you have labs (blood work) drawn today and your tests are completely normal, you will receive your results only by: Marland Kitchen MyChart Message (if you have MyChart) OR . A paper copy in the mail If you have any lab test that is abnormal or we need to change your treatment, we will call you to review the results.  Testing/Procedures: NONE  Follow-Up: At [facility], you and your health needs are our priority.  As part of our continuing mission to provide you with exceptional heart care, we have created designated Provider Care Teams.  These Care Teams include your primary Cardiologist (physician) and Advanced Practice Providers (APPs -  Physician Assistants and Nurse Practitioners) who all work together to provide you with the care you need, when you need it. You will need a follow up appointment in 6 months.  Please call our office 2 months in advance to schedule this appointment.  You may see DR. BERRY or one of the following Advanced Practice Providers on your designated Care Team:   Kerin Ransom, PA-C [place], [place] . Sande Rives, PA-C

## 2018-12-16 DIAGNOSIS — M47816 Spondylosis without myelopathy or radiculopathy, lumbar region: Secondary | ICD-10-CM | POA: Diagnosis not present

## 2018-12-23 ENCOUNTER — Ambulatory Visit: Payer: PPO | Admitting: Cardiovascular Disease

## 2018-12-24 DIAGNOSIS — I10 Essential (primary) hypertension: Secondary | ICD-10-CM | POA: Diagnosis not present

## 2018-12-24 DIAGNOSIS — J329 Chronic sinusitis, unspecified: Secondary | ICD-10-CM | POA: Diagnosis not present

## 2018-12-24 DIAGNOSIS — T783XXA Angioneurotic edema, initial encounter: Secondary | ICD-10-CM | POA: Diagnosis not present

## 2018-12-24 DIAGNOSIS — I5032 Chronic diastolic (congestive) heart failure: Secondary | ICD-10-CM | POA: Diagnosis not present

## 2018-12-24 DIAGNOSIS — T50905A Adverse effect of unspecified drugs, medicaments and biological substances, initial encounter: Secondary | ICD-10-CM | POA: Diagnosis not present

## 2018-12-24 DIAGNOSIS — Z6827 Body mass index (BMI) 27.0-27.9, adult: Secondary | ICD-10-CM | POA: Diagnosis not present

## 2018-12-24 DIAGNOSIS — E538 Deficiency of other specified B group vitamins: Secondary | ICD-10-CM | POA: Diagnosis not present

## 2018-12-24 DIAGNOSIS — E663 Overweight: Secondary | ICD-10-CM | POA: Diagnosis not present

## 2019-01-12 DIAGNOSIS — E063 Autoimmune thyroiditis: Secondary | ICD-10-CM | POA: Diagnosis not present

## 2019-01-12 DIAGNOSIS — Z0001 Encounter for general adult medical examination with abnormal findings: Secondary | ICD-10-CM | POA: Diagnosis not present

## 2019-01-12 DIAGNOSIS — Z1389 Encounter for screening for other disorder: Secondary | ICD-10-CM | POA: Diagnosis not present

## 2019-01-12 DIAGNOSIS — J329 Chronic sinusitis, unspecified: Secondary | ICD-10-CM | POA: Diagnosis not present

## 2019-01-12 DIAGNOSIS — I5032 Chronic diastolic (congestive) heart failure: Secondary | ICD-10-CM | POA: Diagnosis not present

## 2019-01-12 DIAGNOSIS — K219 Gastro-esophageal reflux disease without esophagitis: Secondary | ICD-10-CM | POA: Diagnosis not present

## 2019-01-12 DIAGNOSIS — J9801 Acute bronchospasm: Secondary | ICD-10-CM | POA: Diagnosis not present

## 2019-01-12 DIAGNOSIS — E663 Overweight: Secondary | ICD-10-CM | POA: Diagnosis not present

## 2019-01-12 DIAGNOSIS — I10 Essential (primary) hypertension: Secondary | ICD-10-CM | POA: Diagnosis not present

## 2019-01-12 DIAGNOSIS — Z6827 Body mass index (BMI) 27.0-27.9, adult: Secondary | ICD-10-CM | POA: Diagnosis not present

## 2019-01-13 DIAGNOSIS — Z0001 Encounter for general adult medical examination with abnormal findings: Secondary | ICD-10-CM | POA: Diagnosis not present

## 2019-01-13 DIAGNOSIS — I10 Essential (primary) hypertension: Secondary | ICD-10-CM | POA: Diagnosis not present

## 2019-02-11 DIAGNOSIS — Z79899 Other long term (current) drug therapy: Secondary | ICD-10-CM | POA: Diagnosis not present

## 2019-02-11 DIAGNOSIS — E039 Hypothyroidism, unspecified: Secondary | ICD-10-CM | POA: Diagnosis not present

## 2019-02-20 DIAGNOSIS — D509 Iron deficiency anemia, unspecified: Secondary | ICD-10-CM | POA: Diagnosis not present

## 2019-02-23 ENCOUNTER — Encounter (HOSPITAL_COMMUNITY)
Admission: RE | Admit: 2019-02-23 | Discharge: 2019-02-23 | Disposition: A | Discharge status: Home / Self Care | Payer: PPO | Source: Ambulatory Visit | Attending: Internal Medicine | Admitting: Internal Medicine

## 2019-02-23 ENCOUNTER — Encounter (HOSPITAL_COMMUNITY): Payer: Self-pay

## 2019-02-23 DIAGNOSIS — M810 Age-related osteoporosis without current pathological fracture: Secondary | ICD-10-CM | POA: Insufficient documentation

## 2019-02-23 MED ORDER — SODIUM CHLORIDE 0.9 % IV SOLN
Freq: Once | INTRAVENOUS | Status: AC
  Administered 2019-02-23: 14:00:00 via INTRAVENOUS

## 2019-02-23 MED ORDER — ZOLEDRONIC ACID 5 MG/100ML IV SOLN
5.0000 mg | Freq: Once | INTRAVENOUS | Status: AC
  Administered 2019-02-23: 5 mg via INTRAVENOUS

## 2019-04-08 DIAGNOSIS — D510 Vitamin B12 deficiency anemia due to intrinsic factor deficiency: Secondary | ICD-10-CM | POA: Diagnosis not present

## 2019-04-27 DIAGNOSIS — Z6828 Body mass index (BMI) 28.0-28.9, adult: Secondary | ICD-10-CM | POA: Diagnosis not present

## 2019-04-27 DIAGNOSIS — E039 Hypothyroidism, unspecified: Secondary | ICD-10-CM | POA: Diagnosis not present

## 2019-04-27 DIAGNOSIS — R5383 Other fatigue: Secondary | ICD-10-CM | POA: Diagnosis not present

## 2019-04-27 DIAGNOSIS — I5032 Chronic diastolic (congestive) heart failure: Secondary | ICD-10-CM | POA: Diagnosis not present

## 2019-04-27 DIAGNOSIS — E538 Deficiency of other specified B group vitamins: Secondary | ICD-10-CM | POA: Diagnosis not present

## 2019-04-27 DIAGNOSIS — K146 Glossodynia: Secondary | ICD-10-CM | POA: Diagnosis not present

## 2019-04-27 DIAGNOSIS — Z1389 Encounter for screening for other disorder: Secondary | ICD-10-CM | POA: Diagnosis not present

## 2019-05-26 DIAGNOSIS — D512 Transcobalamin II deficiency: Secondary | ICD-10-CM | POA: Diagnosis not present

## 2019-06-08 DIAGNOSIS — E039 Hypothyroidism, unspecified: Secondary | ICD-10-CM | POA: Diagnosis not present

## 2019-06-09 DIAGNOSIS — M47816 Spondylosis without myelopathy or radiculopathy, lumbar region: Secondary | ICD-10-CM | POA: Diagnosis not present

## 2019-06-23 DIAGNOSIS — D510 Vitamin B12 deficiency anemia due to intrinsic factor deficiency: Secondary | ICD-10-CM | POA: Diagnosis not present

## 2019-07-09 DIAGNOSIS — M5136 Other intervertebral disc degeneration, lumbar region: Secondary | ICD-10-CM | POA: Diagnosis not present

## 2019-07-21 DIAGNOSIS — E039 Hypothyroidism, unspecified: Secondary | ICD-10-CM | POA: Diagnosis not present

## 2019-07-28 ENCOUNTER — Ambulatory Visit: Payer: PPO | Admitting: Cardiovascular Disease

## 2019-07-28 ENCOUNTER — Other Ambulatory Visit: Payer: Self-pay

## 2019-07-28 ENCOUNTER — Encounter: Payer: Self-pay | Admitting: Cardiovascular Disease

## 2019-07-28 DIAGNOSIS — I350 Nonrheumatic aortic (valve) stenosis: Secondary | ICD-10-CM | POA: Diagnosis not present

## 2019-07-28 DIAGNOSIS — I10 Essential (primary) hypertension: Secondary | ICD-10-CM

## 2019-07-28 DIAGNOSIS — E782 Mixed hyperlipidemia: Secondary | ICD-10-CM | POA: Diagnosis not present

## 2019-07-28 NOTE — Assessment & Plan Note (Signed)
History of essential hypertension with blood pressure measured at 104/60.  She is on atenolol, Norvasc and Avapro.

## 2019-07-28 NOTE — Assessment & Plan Note (Signed)
History of mild aortic stenosis with 2D echo performed 11/03/2018 revealing normal LV systolic function with aortic valve area of 1.72 cm and a peak gradient of 17 mmHg.  This does not need to be checked in the future

## 2019-07-28 NOTE — Patient Instructions (Signed)

## 2019-07-28 NOTE — Assessment & Plan Note (Signed)
History of hyperlipidemia on Zetia with lipid profile performed 01/05/2019 revealing total cluster 184, LDL 106 and HDL 65.
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
## 2019-08-10 DIAGNOSIS — M25552 Pain in left hip: Secondary | ICD-10-CM | POA: Diagnosis not present

## 2019-08-17 DIAGNOSIS — E538 Deficiency of other specified B group vitamins: Secondary | ICD-10-CM | POA: Diagnosis not present

## 2019-09-08 DIAGNOSIS — I10 Essential (primary) hypertension: Secondary | ICD-10-CM | POA: Diagnosis not present

## 2019-09-08 DIAGNOSIS — E063 Autoimmune thyroiditis: Secondary | ICD-10-CM | POA: Diagnosis not present

## 2019-09-08 DIAGNOSIS — Z6827 Body mass index (BMI) 27.0-27.9, adult: Secondary | ICD-10-CM | POA: Diagnosis not present

## 2019-09-08 DIAGNOSIS — E663 Overweight: Secondary | ICD-10-CM | POA: Diagnosis not present

## 2019-09-08 DIAGNOSIS — G894 Chronic pain syndrome: Secondary | ICD-10-CM | POA: Diagnosis not present

## 2019-09-08 DIAGNOSIS — M1991 Primary osteoarthritis, unspecified site: Secondary | ICD-10-CM | POA: Diagnosis not present

## 2019-10-02 DIAGNOSIS — E538 Deficiency of other specified B group vitamins: Secondary | ICD-10-CM | POA: Diagnosis not present

## 2019-10-05 DIAGNOSIS — M9902 Segmental and somatic dysfunction of thoracic region: Secondary | ICD-10-CM | POA: Diagnosis not present

## 2019-10-05 DIAGNOSIS — M546 Pain in thoracic spine: Secondary | ICD-10-CM | POA: Diagnosis not present

## 2019-10-05 DIAGNOSIS — M545 Low back pain: Secondary | ICD-10-CM | POA: Diagnosis not present

## 2019-10-05 DIAGNOSIS — M9905 Segmental and somatic dysfunction of pelvic region: Secondary | ICD-10-CM | POA: Diagnosis not present

## 2019-10-05 DIAGNOSIS — M9903 Segmental and somatic dysfunction of lumbar region: Secondary | ICD-10-CM | POA: Diagnosis not present

## 2019-10-07 DIAGNOSIS — M546 Pain in thoracic spine: Secondary | ICD-10-CM | POA: Diagnosis not present

## 2019-10-07 DIAGNOSIS — M9905 Segmental and somatic dysfunction of pelvic region: Secondary | ICD-10-CM | POA: Diagnosis not present

## 2019-10-07 DIAGNOSIS — M9902 Segmental and somatic dysfunction of thoracic region: Secondary | ICD-10-CM | POA: Diagnosis not present

## 2019-10-07 DIAGNOSIS — M545 Low back pain: Secondary | ICD-10-CM | POA: Diagnosis not present

## 2019-10-07 DIAGNOSIS — M9903 Segmental and somatic dysfunction of lumbar region: Secondary | ICD-10-CM | POA: Diagnosis not present

## 2019-10-09 DIAGNOSIS — M9902 Segmental and somatic dysfunction of thoracic region: Secondary | ICD-10-CM | POA: Diagnosis not present

## 2019-10-09 DIAGNOSIS — M9905 Segmental and somatic dysfunction of pelvic region: Secondary | ICD-10-CM | POA: Diagnosis not present

## 2019-10-09 DIAGNOSIS — M546 Pain in thoracic spine: Secondary | ICD-10-CM | POA: Diagnosis not present

## 2019-10-09 DIAGNOSIS — M545 Low back pain: Secondary | ICD-10-CM | POA: Diagnosis not present

## 2019-10-09 DIAGNOSIS — M9903 Segmental and somatic dysfunction of lumbar region: Secondary | ICD-10-CM | POA: Diagnosis not present

## 2019-10-28 DIAGNOSIS — M5416 Radiculopathy, lumbar region: Secondary | ICD-10-CM | POA: Diagnosis not present

## 2019-10-28 DIAGNOSIS — M5136 Other intervertebral disc degeneration, lumbar region: Secondary | ICD-10-CM | POA: Diagnosis not present

## 2019-11-26 DIAGNOSIS — M5136 Other intervertebral disc degeneration, lumbar region: Secondary | ICD-10-CM | POA: Diagnosis not present

## 2019-11-26 DIAGNOSIS — L03032 Cellulitis of left toe: Secondary | ICD-10-CM | POA: Diagnosis not present

## 2019-11-26 DIAGNOSIS — M21611 Bunion of right foot: Secondary | ICD-10-CM | POA: Diagnosis not present

## 2019-11-26 DIAGNOSIS — M21612 Bunion of left foot: Secondary | ICD-10-CM | POA: Diagnosis not present

## 2019-11-26 DIAGNOSIS — L02612 Cutaneous abscess of left foot: Secondary | ICD-10-CM | POA: Diagnosis not present

## 2019-11-30 DIAGNOSIS — D510 Vitamin B12 deficiency anemia due to intrinsic factor deficiency: Secondary | ICD-10-CM | POA: Diagnosis not present

## 2019-12-30 DIAGNOSIS — M65332 Trigger finger, left middle finger: Secondary | ICD-10-CM | POA: Diagnosis not present

## 2019-12-30 DIAGNOSIS — M79645 Pain in left finger(s): Secondary | ICD-10-CM | POA: Diagnosis not present

## 2020-01-14 DIAGNOSIS — Z681 Body mass index (BMI) 19 or less, adult: Secondary | ICD-10-CM | POA: Diagnosis not present

## 2020-01-14 DIAGNOSIS — E538 Deficiency of other specified B group vitamins: Secondary | ICD-10-CM | POA: Diagnosis not present

## 2020-01-14 DIAGNOSIS — R5383 Other fatigue: Secondary | ICD-10-CM | POA: Diagnosis not present

## 2020-01-14 DIAGNOSIS — Z0001 Encounter for general adult medical examination with abnormal findings: Secondary | ICD-10-CM | POA: Diagnosis not present

## 2020-01-14 DIAGNOSIS — K5732 Diverticulitis of large intestine without perforation or abscess without bleeding: Secondary | ICD-10-CM | POA: Diagnosis not present

## 2020-01-14 DIAGNOSIS — M1991 Primary osteoarthritis, unspecified site: Secondary | ICD-10-CM | POA: Diagnosis not present

## 2020-01-14 DIAGNOSIS — G894 Chronic pain syndrome: Secondary | ICD-10-CM | POA: Diagnosis not present

## 2020-01-14 DIAGNOSIS — F329 Major depressive disorder, single episode, unspecified: Secondary | ICD-10-CM | POA: Diagnosis not present

## 2020-01-14 DIAGNOSIS — I10 Essential (primary) hypertension: Secondary | ICD-10-CM | POA: Diagnosis not present

## 2020-01-14 DIAGNOSIS — G4739 Other sleep apnea: Secondary | ICD-10-CM | POA: Diagnosis not present

## 2020-01-27 DIAGNOSIS — M1990 Unspecified osteoarthritis, unspecified site: Secondary | ICD-10-CM | POA: Diagnosis not present

## 2020-01-27 DIAGNOSIS — Z0001 Encounter for general adult medical examination with abnormal findings: Secondary | ICD-10-CM | POA: Diagnosis not present

## 2020-01-27 DIAGNOSIS — Z681 Body mass index (BMI) 19 or less, adult: Secondary | ICD-10-CM | POA: Diagnosis not present

## 2020-01-27 DIAGNOSIS — Z0000 Encounter for general adult medical examination without abnormal findings: Secondary | ICD-10-CM | POA: Diagnosis not present

## 2020-01-27 DIAGNOSIS — Z1389 Encounter for screening for other disorder: Secondary | ICD-10-CM | POA: Diagnosis not present

## 2020-02-08 ENCOUNTER — Telehealth: Payer: Self-pay | Admitting: Medical
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
## 2020-02-09 ENCOUNTER — Telehealth: Payer: Self-pay | Admitting: Cardiovascular Disease

## 2020-02-09 DIAGNOSIS — N390 Urinary tract infection, site not specified: Secondary | ICD-10-CM | POA: Diagnosis not present

## 2020-02-09 NOTE — Telephone Encounter (Signed)
New Message  Pt called and wanted to be seen sooner than her Thursday appointment 02/11/20 due to elevated BP. She said her BP last night was 177/95 and 166/94 she's very concern and hoping to see someone immediately.  Please advice

## 2020-02-09 NOTE — Telephone Encounter (Signed)
Spoke with patient. Patient reports her blood pressure has been elevated and she is severely nervous. Blood pressure last night was 177/95 and this morning it is 166/94. She feels off balance but no other symptoms. Patient denies headache, dizziness, chest pain, SOB. She called to see if she can come in sooner to be evaluated. Patient offered Wednesday at 3:15p with Coletta Memos. Patient accepted appointment.   Patient educated to call 911 if she develops a horrible headache, chest pain, dizziness, shortness of breath or weakness. Patient verbalized understanding.

## 2020-02-09 NOTE — Progress Notes (Signed)
Cardiology Clinic Note   Patient Name: Jo Parrish Date of Encounter: 02/10/2020  Primary Care Provider:  Redmond School, MD Primary Cardiologist:  Quay Burow, MD  Patient Profile    Jo Parrish 86 year old female presents today for follow-up of her lower extremity edema, hypertension, mild aortic stenosis and diastolic heart failure.  Past Medical History    Past Medical History:  Diagnosis Date  . Bacterial overgrowth syndrome    Small bowel  . Complication of anesthesia   . Constipation   . Cough 09/13/2015  . GERD (gastroesophageal reflux disease)   . Hematuria 09/13/2015  . Hyperlipidemia   . Hypertension   . Hypothyroidism   . Left bundle branch block   . OSA (obstructive sleep apnea)    UNABLE TO TOLERATE  MASK  . PONV (postoperative nausea and vomiting)   . Rheumatoid arthritis(714.0)   . Sjogren's syndrome ([place])   . UI (urinary incontinence) 01/16/2016  . Ulcer, esophagus   . Urinary frequency 07/04/2015  . UTI (lower urinary tract infection) 09/13/2015  . Vaginal atrophy 08/02/2015  . Vaginal burning 07/04/2015  . Vaginal dryness 07/04/2015  . Vulvar itching 08/15/2015  . Wheezing on auscultation 09/13/2015   Past Surgical History:  Procedure Laterality Date  . APPENDECTOMY    . COLONOSCOPY  June 2014   Dr. Earlean Shawl, multiple tubular adenomas removed, internal hemorrhoids. Next colonoscopy in 3 years.  Marland Kitchen DILATION AND CURETTAGE OF UTERUS    . ESOPHAGOGASTRODUODENOSCOPY  06/07/2008    Gastritis. No celiac sprue/Small hiatal hernia  . ESOPHAGOGASTRODUODENOSCOPY  June 2014   Dr. Earlean Shawl, erosive esophagitis, grade B., large hiatal hernia.  . Hydrogen breath test   05/17/2008   small bowel bacterial overgrowth/Hydrogen level rise consistent with small bowel bacterial  . TONSILLECTOMY    . TOTAL KNEE ARTHROPLASTY Right 10/09/2013   Procedure: RIGHT TOTAL KNEE ARTHROPLASTY;  Surgeon: Gearlean Alf, MD;  Location: [facility];  Service: Orthopedics;  Laterality:  Right;    Allergies  Allergies  Allergen Reactions  . Amoxicillin     Mouth sores  . Levaquin [Levofloxacin In D5w]     Mouth sores  . Statins Other (See Comments)    Leg problems  . Sulfa Antibiotics Other (See Comments)    Reaction unknown  . Fenofibrate Micronized Other (See Comments)    unknown  . Tizanidine Other (See Comments)    Dry mouth, slurred speech    History of Present Illness    [name] has a past medical history of diastolic heart failure, essential hypertension, hyperlipidemia, and mild aortic stenosis. She has had a successful right total knee replacement.  She presented to [facility] ED 12/15 with chest pain and blood pressure of 172/94.  She attributed this to eating salty green beans earlier that day.  She has been very regimented checking her blood pressure daily and takes amlodipine when necessary and atenolol when her blood pressure appears" out of range" she has a known intermittent left bundle branch block.  She has continued to be very active dancing several times per week.  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  She is retired from [employer] in [place] where she worked for 56 years and 6 months.  She was last seen by Dr. Gwenlyn Parrish on 07/28/2019.  During that time she was doing well and denied chest pain, shortness of breath, and lower extremity edema.  She did however state that she was experiencing some discomfort in her back.  She presents for follow-up today states over the past several days she has noticed that her blood pressure has been elevated.  She had an evaluation with the insurance RN who asked her for her blood pressure.  She reportedly used her home blood pressure cuff which showed a blood pressure in the [id number] systolic.  She was frustrated by this because she has had well-controlled blood pressure.  She called the after-hours triage line and continued to monitor her blood pressure.  Today in the clinic it was 122/58.  I  suspect her home blood pressure cuff is no longer calibrated.  I will give her one of our blood pressure cuffs and a blood pressure log.  She also indicated that she has been having more back discomfort and has been taking tramadol from her orthopedist.  She does not take this medication every day and I have asked her to indicate on her blood pressure log when she takes this medication.  I have reviewed low-sodium diet and will have her return in 1 month for follow-up evaluation.  She denies chest pain, shortness of breath, lower extremity edema, fatigue, palpitations, melena, hematuria, hemoptysis, diaphoresis, weakness, presyncope, syncope, orthopnea, and PND.    Home Medications    Prior to Admission medications   Medication Sig Start Date End Date Taking? Authorizing Provider  amLODipine (NORVASC) 5 MG tablet Take 1 tablet (5 mg total) by mouth 2 (two) times daily. 10/08/14   Lorretta Harp, MD  aspirin 81 MG tablet Take 81 mg every other day by mouth.    [provider]  atenolol (TENORMIN) 25 MG tablet Take 25 mg by mouth daily.     [provider]  ezetimibe (ZETIA) 10 MG tablet Take 1 tablet (10 mg total) by mouth at bedtime. 06/07/16   Barrett, Evelene Croon, PA-C  furosemide (LASIX) 20 MG tablet Take 20 mg daily if needed for swelling 06/07/16   Barrett, Evelene Croon, PA-C  irbesartan (AVAPRO) 300 MG tablet Take 300 mg by mouth every morning.  07/20/13   [provider]  levothyroxine (SYNTHROID) 75 MCG tablet Take 75 mcg by mouth every other day. TAKE 75 MCG EVERY OTHER DAY ALTERNATING WITH 100 MCG.    [provider]  levothyroxine (SYNTHROID, LEVOTHROID) 100 MCG tablet Take 100 mcg by mouth every other day. TAKE 100 MCG EVERY OTHER DAY ALTERNATING WITH 75 MCG.    [provider]  Naproxen Sodium (ALEVE PO) Take by mouth as needed.    [provider]  omeprazole (PRILOSEC) 40 MG capsule Take 40 mg by mouth every morning.  07/04/13   [provider]  polyethylene glycol powder (GLYCOLAX) powder Take 17 g by mouth daily.     [provider]  UNABLE TO FIND Injection for bones-once a year    [provider]  VITAMIN B1-B12 IJ Inject as directed every 30 (thirty) days.    [provider]  zoledronic acid (RECLAST) 5 MG/100ML SOLN injection Inject 5 mg into the vein once. yearly    Redmond School, MD    Family History    Family History  Problem Relation Age of Onset  . Stroke Mother   . Heart disease Father   . Liver disease Father   . COPD Son   . Sleep apnea Son   . Stroke Maternal Grandmother    She indicated that her mother is deceased. She indicated that her father is deceased. She indicated that her maternal grandmother is deceased.  She indicated that her maternal grandfather is deceased. She indicated that her paternal grandmother is deceased. She indicated that her paternal grandfather is deceased. She indicated that her son is alive.  Social History    Social History   Socioeconomic History  . Marital status: Widowed    Spouse name: Not on file  . Number of children: Not on file  . Years of education: Not on file  . Highest education level: Not on file  Occupational History  . Occupation: [employer]  Tobacco Use  . Smoking status: Never Smoker  . Smokeless tobacco: Never Used  Substance and Sexual Activity  . Alcohol use: No  . Drug use: No  . Sexual activity: Not Currently    Birth control/protection: Post-menopausal  Other Topics Concern  . Not on file  Social History Narrative  . Not on file   Social Determinants of Health   Financial Resource Strain:   . Difficulty of Paying Living Expenses: Not on file  Food Insecurity:   . Worried About Charity fundraiser in the Last Year: Not on file  . Ran Out of Food in the Last Year: Not on file  Transportation Needs:   . Lack of Transportation (Medical): Not on file  . Lack of Transportation (Non-Medical): Not on  file  Physical Activity:   . Days of Exercise per Week: Not on file  . Minutes of Exercise per Session: Not on file  Stress:   . Feeling of Stress : Not on file  Social Connections:   . Frequency of Communication with Friends and Family: Not on file  . Frequency of Social Gatherings with Friends and Family: Not on file  . Attends Religious Services: Not on file  . Active Member of Clubs or Organizations: Not on file  . Attends Archivist Meetings: Not on file  . Marital Status: Not on file  Intimate Partner Violence:   . Fear of Current or Ex-Partner: Not on file  . Emotionally Abused: Not on file  . Physically Abused: Not on file  . Sexually Abused: Not on file     Review of Systems    General:  No chills, fever, night sweats or weight changes.  Cardiovascular:  No chest pain, dyspnea on exertion, edema, orthopnea, palpitations, paroxysmal nocturnal dyspnea. Dermatological: No rash, lesions/masses Respiratory: No cough, dyspnea Urologic: No hematuria, dysuria Abdominal:   No nausea, vomiting, diarrhea, bright red blood per rectum, melena, or hematemesis Neurologic:  No visual changes, wkns, changes in mental status. All other systems reviewed and are otherwise negative except as noted above.  Physical Exam    VS:  BP (!) 128/56   Pulse 81   Ht 5\' 2"  (1.575 m)   Wt 158 lb 3.2 oz (71.8 kg)   SpO2 95%   BMI 28.94 kg/m  , BMI Body mass index is 28.94 kg/m. GEN: Well nourished, well developed, in no acute distress. HEENT: normal. Neck: Supple, no JVD, carotid bruits, or masses. Cardiac: RRR, no murmurs, rubs, or gallops. No clubbing, cyanosis, edema.  Radials/DP/PT 2+ and equal bilaterally.  Respiratory:  Respirations regular and unlabored, clear to auscultation bilaterally. GI: Soft, nontender, nondistended, BS + x 4. MS: no deformity or atrophy. Skin: warm and dry, no rash. Neuro:  Strength and sensation are intact. Psych: Normal affect.  Accessory Clinical  Findings    ECG personally reviewed by me today-none today-  EKG 07/28/2019 Normal sinus rhythm left bundle branch block 76 bpm  Echocardiogram 11/03/2018  Study Conclusions   - Left ventricle: The cavity size was normal. Wall thickness was  increased increased in a pattern of mild to moderate LVH.  Systolic function was normal. The estimated ejection fraction was  in the range of 55% to 60%. Doppler parameters are consistent  with abnormal left ventricular relaxation (grade 1 diastolic  dysfunction).  - Aortic valve: Mildly calcified annulus. Trileaflet; mildly  thickened leaflets. There was mild to moderate regurgitation.  Valve area (VTI): 1.72 cm^2. Valve area (Vmax): 1.87 cm^2. Valve  area (Vmean): 1.91 cm^2.  - Mitral valve: Mildly calcified annulus. Mildly thickened leaflets  .  - Atrial septum: No defect or patent foramen ovale was identified.  - Technically adequate study.  Nuclear stress test 11/20/2017  Nuclear stress EF: 55%. The left ventricular ejection fraction is normal (55-65%).  Defect 1: There is a medium defect of moderate severity present in the mid anteroseptal, mid inferoseptal, apical anterior, apical septal and apex location. This appears to be most consistent with artifact from the LBBB and is similar to the defect described in her previous myoview in 2012.  This is a low risk study.    Assessment & Plan   1.  Essential hypertension-BP today 128/56 Continue atenolol 25 mg daily Continue amlodipine 5 mg twice daily Continue Avapro 300 mg daily Heart healthy low-sodium diet-salty 6 given Continue physical activity Blood pressure cuff given Maintain blood pressure log  Hyperlipidemia-LDL 115 06/21/2015 Continue Zetia 10 mg daily Heart healthy low-sodium high-fiber diet Continue physical activity  Mild aortic stenosis-echocardiogram 11/19 showed LVEF 55 to 60%, aortic valve mildly calcified annulus. Trileaflet; mildly  thickened  leaflets. There was mild to moderate regurgitation.  Valve area (VTI): 1.72 cm^2. Has been reviewed and no need to reevaluate in the future.  Disposition: Follow-up with me in 1 month.  Jossie Ng. [facility] [place] [address] Office [phone] Fax [phone]

## 2020-02-10 ENCOUNTER — Encounter: Payer: Self-pay | Admitting: General Practice

## 2020-02-10 ENCOUNTER — Ambulatory Visit: Payer: PPO | Admitting: General Practice

## 2020-02-10 ENCOUNTER — Other Ambulatory Visit: Payer: Self-pay

## 2020-02-10 VITALS — BP 128/56 | HR 81 | Ht 62.0 in | Wt 158.2 lb

## 2020-02-10 DIAGNOSIS — E782 Mixed hyperlipidemia: Secondary | ICD-10-CM | POA: Diagnosis not present

## 2020-02-10 DIAGNOSIS — I350 Nonrheumatic aortic (valve) stenosis: Secondary | ICD-10-CM | POA: Diagnosis not present

## 2020-02-10 DIAGNOSIS — I10 Essential (primary) hypertension: Secondary | ICD-10-CM | POA: Diagnosis not present

## 2020-02-10 NOTE — Patient Instructions (Addendum)
Medication Instructions:  The current medical regimen is effective;  continue present plan and medications as directed. Please refer to the Current Medication list given to you today. If you need a refill on your cardiac medications before your next appointment, please call your pharmacy.  Special Instructions: Take and log you blood pressure twice daily at different times daily. Make sure to take at least 1 hour after taking medication.  PLEASE READ AND FOLLOW SALTY 6 ATTACHED  Follow-Up: 1 month  In Person  WITH JESSE CLEAVER, FNP-C.    At [facility], you and your health needs are our priority.  As part of our continuing mission to provide you with exceptional heart care, we have created designated Provider Care Teams.  These Care Teams include your primary Cardiologist (physician) and Advanced Practice Providers (APPs -  Physician Assistants and Nurse Practitioners) who all work together to provide you with the care you need, when you need it.  Reduce your risk of getting COVID-19 With your heart disease it is especially important for people at increased risk of severe illness from COVID-19, and those who live with them, to protect themselves from getting COVID-19. The best way to protect yourself and to help reduce the spread of the virus that causes COVID-19 is to: Marland Kitchen Limit your interactions with other people as much as possible. . Take COVID-19 when you do interact with others. If you start feeling sick and think you may have COVID-19, get in touch with your healthcare provider within 24 hours.  Thank you for choosing [facility] at [facility]!!

## 2020-02-11 ENCOUNTER — Ambulatory Visit: Payer: PPO | Admitting: General Practice

## 2020-02-14 DIAGNOSIS — M810 Age-related osteoporosis without current pathological fracture: Secondary | ICD-10-CM | POA: Diagnosis not present

## 2020-02-14 DIAGNOSIS — E063 Autoimmune thyroiditis: Secondary | ICD-10-CM | POA: Diagnosis not present

## 2020-02-14 DIAGNOSIS — I110 Hypertensive heart disease with heart failure: Secondary | ICD-10-CM | POA: Diagnosis not present

## 2020-02-14 DIAGNOSIS — I5032 Chronic diastolic (congestive) heart failure: Secondary | ICD-10-CM | POA: Diagnosis not present

## 2020-02-17 DIAGNOSIS — N390 Urinary tract infection, site not specified: Secondary | ICD-10-CM | POA: Diagnosis not present

## 2020-02-18 DIAGNOSIS — B373 Candidiasis of vulva and vagina: Secondary | ICD-10-CM | POA: Diagnosis not present

## 2020-02-18 DIAGNOSIS — E663 Overweight: Secondary | ICD-10-CM | POA: Diagnosis not present

## 2020-02-18 DIAGNOSIS — E538 Deficiency of other specified B group vitamins: Secondary | ICD-10-CM | POA: Diagnosis not present

## 2020-02-18 DIAGNOSIS — Z6827 Body mass index (BMI) 27.0-27.9, adult: Secondary | ICD-10-CM | POA: Diagnosis not present

## 2020-02-18 DIAGNOSIS — S30860A Insect bite (nonvenomous) of lower back and pelvis, initial encounter: Secondary | ICD-10-CM | POA: Diagnosis not present

## 2020-02-18 DIAGNOSIS — M545 Low back pain: Secondary | ICD-10-CM | POA: Diagnosis not present

## 2020-02-24 ENCOUNTER — Encounter (HOSPITAL_COMMUNITY): Payer: PPO

## 2020-02-26 ENCOUNTER — Other Ambulatory Visit: Payer: Self-pay

## 2020-02-26 ENCOUNTER — Encounter (HOSPITAL_COMMUNITY)
Admission: RE | Admit: 2020-02-26 | Discharge: 2020-02-26 | Disposition: A | Discharge status: Home / Self Care | Payer: PPO | Source: Ambulatory Visit | Attending: Internal Medicine | Admitting: Internal Medicine

## 2020-02-26 DIAGNOSIS — M810 Age-related osteoporosis without current pathological fracture: Secondary | ICD-10-CM | POA: Insufficient documentation

## 2020-02-26 MED ORDER — ZOLEDRONIC ACID 5 MG/100ML IV SOLN
INTRAVENOUS | Status: AC
  Filled 2020-02-26: qty 100

## 2020-02-26 MED ORDER — SODIUM CHLORIDE 0.9 % IV SOLN: Freq: Once | INTRAVENOUS | Status: AC

## 2020-02-26 MED ORDER — ZOLEDRONIC ACID 5 MG/100ML IV SOLN
5.0000 mg | Freq: Once | INTRAVENOUS | Status: AC
  Administered 2020-02-26: 14:00:00 5 mg via INTRAVENOUS

## 2020-03-01 DIAGNOSIS — R32 Unspecified urinary incontinence: Secondary | ICD-10-CM | POA: Diagnosis not present

## 2020-03-01 DIAGNOSIS — L8990 Pressure ulcer of unspecified site, unspecified stage: Secondary | ICD-10-CM | POA: Diagnosis not present

## 2020-03-01 DIAGNOSIS — Z6828 Body mass index (BMI) 28.0-28.9, adult: Secondary | ICD-10-CM | POA: Diagnosis not present

## 2020-03-01 DIAGNOSIS — M25551 Pain in right hip: Secondary | ICD-10-CM | POA: Diagnosis not present

## 2020-03-01 DIAGNOSIS — M545 Low back pain: Secondary | ICD-10-CM | POA: Diagnosis not present

## 2020-03-01 DIAGNOSIS — E663 Overweight: Secondary | ICD-10-CM | POA: Diagnosis not present

## 2020-03-04 DIAGNOSIS — M47896 Other spondylosis, lumbar region: Secondary | ICD-10-CM | POA: Diagnosis not present

## 2020-03-04 DIAGNOSIS — M5136 Other intervertebral disc degeneration, lumbar region: Secondary | ICD-10-CM | POA: Diagnosis not present

## 2020-03-04 DIAGNOSIS — M47816 Spondylosis without myelopathy or radiculopathy, lumbar region: Secondary | ICD-10-CM | POA: Diagnosis not present

## 2020-03-10 ENCOUNTER — Ambulatory Visit: Payer: PPO | Admitting: General Practice

## 2020-03-21 ENCOUNTER — Emergency Department (HOSPITAL_COMMUNITY): Admission: EM | Admit: 2020-03-21 | Discharge: 2020-03-21 | Discharge status: Against Medical Advice | Payer: PPO

## 2020-03-21 ENCOUNTER — Other Ambulatory Visit: Payer: Self-pay

## 2020-03-21 NOTE — ED Triage Notes (Signed)
Pt states BP has been elevated since yesterday. Pt ate ham yesterday and this morning. BP has been 169/83 today. BP here in ED 136/72. Pt now states she does not want to be seen.

## 2020-03-23 DIAGNOSIS — N342 Other urethritis: Secondary | ICD-10-CM | POA: Diagnosis not present

## 2020-03-23 DIAGNOSIS — I10 Essential (primary) hypertension: Secondary | ICD-10-CM | POA: Diagnosis not present

## 2020-03-23 DIAGNOSIS — E663 Overweight: Secondary | ICD-10-CM | POA: Diagnosis not present

## 2020-03-23 DIAGNOSIS — Z6826 Body mass index (BMI) 26.0-26.9, adult: Secondary | ICD-10-CM | POA: Diagnosis not present

## 2020-03-23 DIAGNOSIS — N390 Urinary tract infection, site not specified: Secondary | ICD-10-CM | POA: Diagnosis not present

## 2020-03-25 ENCOUNTER — Telehealth: Payer: Self-pay | Admitting: Cardiovascular Disease

## 2020-03-25 ENCOUNTER — Telehealth: Payer: Self-pay | Admitting: Internal Medicine

## 2020-03-25 MED ORDER — SPIRONOLACTONE 25 MG PO TABS: 12.5000 mg | ORAL_TABLET | Freq: Every day | ORAL | 1 refills | Status: DC

## 2020-03-25 NOTE — Telephone Encounter (Signed)
Last month had UTI and BP found to be elevated.  Saw PA who gave her a new cuff and said keep a check on it for 30 days.  She cancelled the follow up. Has been monitoring BP.  Up and down.  Especially this week.  This am 148/80, took meds afterward 130/64.   3pm 168/90.  "probably higher now because when you worry about it it goes up." Reviewed with Fuller Canada, PharmD. Spironolactone 12.5 mg daily added.  Pt instructed to take at noon daily but first does this evening when she picks it up.   HTN clinic appointment on 4/22.  Wants [name] appointment also.  Will send message to his nurse to see when she can be seen.   Normal for her is 116/60-70s

## 2020-03-25 NOTE — Telephone Encounter (Signed)
Follow up  Pt called back to follow up regarding her BP. She said she really hoping to get a call from nurse since her BP has been elevated and doesn't want to deal with it over the weekend  Please call

## 2020-03-25 NOTE — Telephone Encounter (Signed)
New Message  Pt c/o BP issue: STAT if pt c/o blurred vision, one-sided weakness or slurred speech  1. What are your last 5 BP readings? 130/64, 148/80, 168/90,   2. Are you having any other symptoms (ex. Dizziness, headache, blurred vision, passed out)? Off balance, blurred vision (at the beginning of the week)  3. What is your BP issue? Patient states that he blood pressure is elevated and patient states that she feels off balance.

## 2020-03-25 NOTE — Telephone Encounter (Signed)
[name] called earlier today regarding her blood pressure trending on the higher side (see recent telephone encounter). She was prescribed low-dose spironolactone 12.5mg  which she just started this evening. She called back again tonight because she feels like she is urinating less over the last few hours - she was able to make herself urinate 2 hours ago. This was after reading all of the potential adverse effects of this medication which she says include decreased urination. She says she was also diagnosed with a UTI by UA recently. I relayed that it is normal to go a couple of hours without urinating and that it is likely not related to such a low dose of this medication, which is actually a diuretic. Her blood pressure has improved tonight. She will monitor until noon tomorrow and call us back if she still has not urinated. I advised she also discuss this with her PCP.

## 2020-04-04 NOTE — Telephone Encounter (Signed)
Returned call to patient she stated she was started on Spironolactone 25 mg 1/2 tablet daily 10 days ago.Stated she cannot take makes her heart beat fast,light headed.Stated her heart will beat fast for about 3 to 4 hours after she takes.Advised not to take.Stated her B/P today 168/89.Stated she wanted to see Dr.Berry only.Appointment scheduled with Dr.Berry 4/21 at 4:30 pm.Advised to bring all medications and B/P readings to appointment.

## 2020-04-04 NOTE — Telephone Encounter (Signed)
  Pt c/o medication issue:  1. Name of Medication: spironolactone (ALDACTONE) 25 MG tablet  2. How are you currently taking this medication (dosage and times per day)? Half a tablet a day at noon  3. Are you having a reaction (difficulty breathing--STAT)? no  4. What is your medication issue? Patient states she cannot continue to take this medication, because it is causing her to have dizziness, fatigue, anxiety, and nausea. She also states she is having blurred vision and her BP is now 124/67 HR 23.

## 2020-04-06 ENCOUNTER — Other Ambulatory Visit: Payer: Self-pay

## 2020-04-06 ENCOUNTER — Ambulatory Visit: Payer: PPO | Admitting: Cardiovascular Disease

## 2020-04-06 ENCOUNTER — Encounter: Payer: Self-pay | Admitting: Cardiovascular Disease

## 2020-04-06 VITALS — BP 124/68 | HR 79 | Ht 62.0 in | Wt 155.0 lb

## 2020-04-06 DIAGNOSIS — E782 Mixed hyperlipidemia: Secondary | ICD-10-CM | POA: Diagnosis not present

## 2020-04-06 DIAGNOSIS — I10 Essential (primary) hypertension: Secondary | ICD-10-CM | POA: Diagnosis not present

## 2020-04-06 DIAGNOSIS — I350 Nonrheumatic aortic (valve) stenosis: Secondary | ICD-10-CM | POA: Diagnosis not present
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
## 2020-04-06 NOTE — Patient Instructions (Signed)

## 2020-04-06 NOTE — Assessment & Plan Note (Signed)
History of hyperlipidemia on Zetia with lipid profile performed 01/27/2020 revealing a total cholesterol 177, LDL of 98 and HDL of 62

## 2020-04-06 NOTE — Assessment & Plan Note (Signed)
Patient is sent hypertension with blood pressure measured today at 124/68.  She is on amlodipine, atenolol and Avapro.  She was recently placed on spironolactone which she did not tolerate.  We talked about the importance of salt avoidance.

## 2020-04-06 NOTE — Assessment & Plan Note (Signed)
History of mild aortic stenosis with 2D echo performed 11/03/2018 revealing normal EF with a valve area of 1.7 cm.  She really is asymptomatic and this does not need to be checked again.

## 2020-04-07 ENCOUNTER — Ambulatory Visit: Payer: PPO

## 2020-04-12 DIAGNOSIS — E538 Deficiency of other specified B group vitamins: Secondary | ICD-10-CM | POA: Diagnosis not present

## 2020-04-13 DIAGNOSIS — T1512XA Foreign body in conjunctival sac, left eye, initial encounter: Secondary | ICD-10-CM | POA: Diagnosis not present

## 2020-04-22 DIAGNOSIS — H04123 Dry eye syndrome of bilateral lacrimal glands: Secondary | ICD-10-CM | POA: Diagnosis not present

## 2020-04-25 DIAGNOSIS — R5383 Other fatigue: Secondary | ICD-10-CM | POA: Diagnosis not present

## 2020-04-25 DIAGNOSIS — I10 Essential (primary) hypertension: Secondary | ICD-10-CM | POA: Diagnosis not present

## 2020-04-25 DIAGNOSIS — E063 Autoimmune thyroiditis: Secondary | ICD-10-CM | POA: Diagnosis not present

## 2020-04-25 DIAGNOSIS — Z6827 Body mass index (BMI) 27.0-27.9, adult: Secondary | ICD-10-CM | POA: Diagnosis not present

## 2020-04-25 DIAGNOSIS — G894 Chronic pain syndrome: Secondary | ICD-10-CM | POA: Diagnosis not present

## 2020-05-25 DIAGNOSIS — R112 Nausea with vomiting, unspecified: Secondary | ICD-10-CM | POA: Diagnosis not present

## 2020-05-25 DIAGNOSIS — R1010 Upper abdominal pain, unspecified: Secondary | ICD-10-CM | POA: Diagnosis not present

## 2020-06-07 ENCOUNTER — Other Ambulatory Visit: Payer: Self-pay | Admitting: Internal Medicine

## 2020-06-07 ENCOUNTER — Other Ambulatory Visit (HOSPITAL_COMMUNITY): Payer: Self-pay | Admitting: Internal Medicine

## 2020-06-07 DIAGNOSIS — R1010 Upper abdominal pain, unspecified: Secondary | ICD-10-CM

## 2020-06-07 DIAGNOSIS — R110 Nausea: Secondary | ICD-10-CM

## 2020-06-15 DIAGNOSIS — E063 Autoimmune thyroiditis: Secondary | ICD-10-CM | POA: Diagnosis not present

## 2020-06-15 DIAGNOSIS — M810 Age-related osteoporosis without current pathological fracture: Secondary | ICD-10-CM | POA: Diagnosis not present

## 2020-06-15 DIAGNOSIS — I5032 Chronic diastolic (congestive) heart failure: Secondary | ICD-10-CM | POA: Diagnosis not present

## 2020-06-15 DIAGNOSIS — I110 Hypertensive heart disease with heart failure: Secondary | ICD-10-CM | POA: Diagnosis not present

## 2020-06-28 DIAGNOSIS — M5136 Other intervertebral disc degeneration, lumbar region: Secondary | ICD-10-CM | POA: Diagnosis not present

## 2020-06-30 ENCOUNTER — Encounter (HOSPITAL_COMMUNITY): Payer: Self-pay

## 2020-06-30 ENCOUNTER — Ambulatory Visit (HOSPITAL_COMMUNITY): Payer: PPO

## 2020-07-05 DIAGNOSIS — E538 Deficiency of other specified B group vitamins: Secondary | ICD-10-CM | POA: Diagnosis not present

## 2020-07-06 DIAGNOSIS — H5213 Myopia, bilateral: Secondary | ICD-10-CM | POA: Diagnosis not present

## 2020-07-06 DIAGNOSIS — H04222 Epiphora due to insufficient drainage, left lacrimal gland: Secondary | ICD-10-CM | POA: Diagnosis not present

## 2020-07-20 DIAGNOSIS — E538 Deficiency of other specified B group vitamins: Secondary | ICD-10-CM | POA: Diagnosis not present

## 2020-07-20 DIAGNOSIS — M48061 Spinal stenosis, lumbar region without neurogenic claudication: Secondary | ICD-10-CM | POA: Diagnosis not present

## 2020-07-20 DIAGNOSIS — R2689 Other abnormalities of gait and mobility: Secondary | ICD-10-CM | POA: Diagnosis not present

## 2020-07-20 DIAGNOSIS — G894 Chronic pain syndrome: Secondary | ICD-10-CM | POA: Diagnosis not present

## 2020-07-20 DIAGNOSIS — E663 Overweight: Secondary | ICD-10-CM | POA: Diagnosis not present

## 2020-07-20 DIAGNOSIS — E063 Autoimmune thyroiditis: Secondary | ICD-10-CM | POA: Diagnosis not present

## 2020-07-20 DIAGNOSIS — Z6827 Body mass index (BMI) 27.0-27.9, adult: Secondary | ICD-10-CM | POA: Diagnosis not present

## 2020-08-09 DIAGNOSIS — M545 Low back pain: Secondary | ICD-10-CM | POA: Diagnosis not present

## 2020-08-16 DIAGNOSIS — E538 Deficiency of other specified B group vitamins: Secondary | ICD-10-CM | POA: Diagnosis not present

## 2020-08-31 DIAGNOSIS — M47816 Spondylosis without myelopathy or radiculopathy, lumbar region: Secondary | ICD-10-CM | POA: Diagnosis not present

## 2020-08-31 DIAGNOSIS — M4807 Spinal stenosis, lumbosacral region: Secondary | ICD-10-CM | POA: Diagnosis not present

## 2020-08-31 DIAGNOSIS — M5126 Other intervertebral disc displacement, lumbar region: Secondary | ICD-10-CM | POA: Diagnosis not present

## 2020-08-31 DIAGNOSIS — G588 Other specified mononeuropathies: Secondary | ICD-10-CM | POA: Diagnosis not present

## 2020-08-31 DIAGNOSIS — I10 Essential (primary) hypertension: Secondary | ICD-10-CM | POA: Diagnosis not present

## 2020-08-31 DIAGNOSIS — M545 Low back pain: Secondary | ICD-10-CM | POA: Diagnosis not present

## 2020-08-31 DIAGNOSIS — M5136 Other intervertebral disc degeneration, lumbar region: Secondary | ICD-10-CM | POA: Diagnosis not present

## 2020-09-08 DIAGNOSIS — M5136 Other intervertebral disc degeneration, lumbar region: Secondary | ICD-10-CM | POA: Diagnosis not present

## 2020-09-14 DIAGNOSIS — E538 Deficiency of other specified B group vitamins: Secondary | ICD-10-CM | POA: Diagnosis not present

## 2020-09-15 DIAGNOSIS — E063 Autoimmune thyroiditis: Secondary | ICD-10-CM | POA: Diagnosis not present

## 2020-09-15 DIAGNOSIS — M810 Age-related osteoporosis without current pathological fracture: Secondary | ICD-10-CM | POA: Diagnosis not present

## 2020-09-15 DIAGNOSIS — F4542 Pain disorder with related psychological factors: Secondary | ICD-10-CM | POA: Diagnosis not present

## 2020-09-15 DIAGNOSIS — G894 Chronic pain syndrome: Secondary | ICD-10-CM | POA: Diagnosis not present

## 2020-10-04 ENCOUNTER — Ambulatory Visit: Payer: PPO | Admitting: Cardiovascular Disease

## 2020-10-04 ENCOUNTER — Encounter: Payer: Self-pay | Admitting: Cardiovascular Disease

## 2020-10-04 ENCOUNTER — Other Ambulatory Visit: Payer: Self-pay

## 2020-10-04 VITALS — BP 128/74 | HR 70 | Ht 62.0 in | Wt 163.0 lb

## 2020-10-04 DIAGNOSIS — I5032 Chronic diastolic (congestive) heart failure: Secondary | ICD-10-CM | POA: Diagnosis not present

## 2020-10-04 DIAGNOSIS — I10 Essential (primary) hypertension: Secondary | ICD-10-CM

## 2020-10-04 DIAGNOSIS — E782 Mixed hyperlipidemia: Secondary | ICD-10-CM

## 2020-10-04 NOTE — Assessment & Plan Note (Signed)
History of chronic diastolic heart failure with 2D echo performed 11/03/2018 revealing normal LV systolic function with grade 1 diastolic dysfunction.  She is on oral diuretic.  She appears euvolemic.

## 2020-10-04 NOTE — Assessment & Plan Note (Signed)
History of essential hypertension blood pressure measured today 120/74.  She is on amlodipine, atenolol and Avapro.
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
## 2020-10-04 NOTE — Assessment & Plan Note (Signed)
History of hyperlipidemia on Zetia with lipid profile performed 09/25/2020 revealing total cholesterol 177, LDL of 98 and HDL 62.

## 2020-10-04 NOTE — Patient Instructions (Signed)
Medication Instructions:  Your physician recommends that you continue on your current medications as directed. Please refer to the Current Medication list given to you today.  *If you need a refill on your cardiac medications before your next appointment, please call your pharmacy*  Lab Work: NONE ordered at this time of appointment   If you have labs (blood work) drawn today and your tests are completely normal, you will receive your results only by:  [place] (if you have MyChart) OR  A paper copy in the mail If you have any lab test that is abnormal or we need to change your treatment, we will call you to review the results.  Testing/Procedures: NONE ordered at this time of appointment   Follow-Up: At [facility], you and your health needs are our priority.  As part of our continuing mission to provide you with exceptional heart care, we have created designated Provider Care Teams.  These Care Teams include your primary Cardiologist (physician) and Advanced Practice Providers (APPs -  Physician Assistants and Nurse Practitioners) who all work together to provide you with the care you need, when you need it.  We recommend signing up for the patient portal called "MyChart".  Sign up information is provided on this After Visit Summary.  MyChart is used to connect with patients for Virtual Visits (Telemedicine).  Patients are able to view lab/test results, encounter notes, upcoming appointments, etc.  Non-urgent messages can be sent to your provider as well.   To learn more about what you can do with MyChart, go to [email or web address].    Your next appointment:   1 year(s)  The format for your next appointment:   In Person  Provider:   Quay Burow, MD  Other Instructions

## 2020-10-06 DIAGNOSIS — G588 Other specified mononeuropathies: Secondary | ICD-10-CM | POA: Diagnosis not present

## 2020-10-06 DIAGNOSIS — M5136 Other intervertebral disc degeneration, lumbar region: Secondary | ICD-10-CM | POA: Diagnosis not present

## 2020-10-06 DIAGNOSIS — M47816 Spondylosis without myelopathy or radiculopathy, lumbar region: Secondary | ICD-10-CM | POA: Diagnosis not present

## 2020-10-15 DIAGNOSIS — G894 Chronic pain syndrome: Secondary | ICD-10-CM | POA: Diagnosis not present

## 2020-10-15 DIAGNOSIS — E063 Autoimmune thyroiditis: Secondary | ICD-10-CM | POA: Diagnosis not present

## 2020-10-15 DIAGNOSIS — M810 Age-related osteoporosis without current pathological fracture: Secondary | ICD-10-CM | POA: Diagnosis not present

## 2020-10-15 DIAGNOSIS — F4542 Pain disorder with related psychological factors: Secondary | ICD-10-CM | POA: Diagnosis not present

## 2020-11-15 DIAGNOSIS — F4542 Pain disorder with related psychological factors: Secondary | ICD-10-CM | POA: Diagnosis not present

## 2020-11-15 DIAGNOSIS — E063 Autoimmune thyroiditis: Secondary | ICD-10-CM | POA: Diagnosis not present

## 2020-11-15 DIAGNOSIS — G894 Chronic pain syndrome: Secondary | ICD-10-CM | POA: Diagnosis not present

## 2020-11-15 DIAGNOSIS — M810 Age-related osteoporosis without current pathological fracture: Secondary | ICD-10-CM | POA: Diagnosis not present

## 2020-11-30 DIAGNOSIS — E538 Deficiency of other specified B group vitamins: Secondary | ICD-10-CM | POA: Diagnosis not present

## 2020-12-15 DIAGNOSIS — M5136 Other intervertebral disc degeneration, lumbar region: Secondary | ICD-10-CM | POA: Diagnosis not present

## 2021-01-16 DIAGNOSIS — Z6826 Body mass index (BMI) 26.0-26.9, adult: Secondary | ICD-10-CM | POA: Diagnosis not present

## 2021-01-16 DIAGNOSIS — Z1331 Encounter for screening for depression: Secondary | ICD-10-CM | POA: Diagnosis not present

## 2021-01-16 DIAGNOSIS — M810 Age-related osteoporosis without current pathological fracture: Secondary | ICD-10-CM | POA: Diagnosis not present

## 2021-01-16 DIAGNOSIS — Z0001 Encounter for general adult medical examination with abnormal findings: Secondary | ICD-10-CM | POA: Diagnosis not present

## 2021-01-16 DIAGNOSIS — E538 Deficiency of other specified B group vitamins: Secondary | ICD-10-CM | POA: Diagnosis not present

## 2021-01-16 DIAGNOSIS — E7849 Other hyperlipidemia: Secondary | ICD-10-CM | POA: Diagnosis not present

## 2021-01-26 DIAGNOSIS — M5416 Radiculopathy, lumbar region: Secondary | ICD-10-CM | POA: Diagnosis not present

## 2021-02-01 DIAGNOSIS — E7849 Other hyperlipidemia: Secondary | ICD-10-CM | POA: Diagnosis not present

## 2021-02-01 DIAGNOSIS — E663 Overweight: Secondary | ICD-10-CM | POA: Diagnosis not present

## 2021-02-01 DIAGNOSIS — Z1389 Encounter for screening for other disorder: Secondary | ICD-10-CM | POA: Diagnosis not present

## 2021-02-01 DIAGNOSIS — Z0001 Encounter for general adult medical examination with abnormal findings: Secondary | ICD-10-CM | POA: Diagnosis not present

## 2021-02-01 DIAGNOSIS — Z6826 Body mass index (BMI) 26.0-26.9, adult: Secondary | ICD-10-CM | POA: Diagnosis not present

## 2021-02-27 ENCOUNTER — Other Ambulatory Visit: Payer: Self-pay

## 2021-02-27 ENCOUNTER — Encounter (HOSPITAL_COMMUNITY): Payer: Self-pay

## 2021-02-27 ENCOUNTER — Encounter (HOSPITAL_COMMUNITY)
Admission: RE | Admit: 2021-02-27 | Discharge: 2021-02-27 | Disposition: A | Discharge status: Home / Self Care | Payer: PPO | Source: Ambulatory Visit | Attending: Internal Medicine | Admitting: Internal Medicine

## 2021-02-27 DIAGNOSIS — M810 Age-related osteoporosis without current pathological fracture: Secondary | ICD-10-CM | POA: Diagnosis not present

## 2021-02-27 MED ORDER — SODIUM CHLORIDE 0.9 % IV SOLN: Freq: Once | INTRAVENOUS | Status: AC

## 2021-02-27 MED ORDER — ZOLEDRONIC ACID 5 MG/100ML IV SOLN
5.0000 mg | Freq: Once | INTRAVENOUS | Status: AC
  Administered 2021-02-27: 5 mg via INTRAVENOUS

## 2021-02-28 DIAGNOSIS — M5416 Radiculopathy, lumbar region: Secondary | ICD-10-CM | POA: Diagnosis not present

## 2021-02-28 DIAGNOSIS — G588 Other specified mononeuropathies: Secondary | ICD-10-CM | POA: Diagnosis not present

## 2021-02-28 DIAGNOSIS — M5136 Other intervertebral disc degeneration, lumbar region: Secondary | ICD-10-CM | POA: Diagnosis not present

## 2021-03-08 ENCOUNTER — Other Ambulatory Visit: Payer: Self-pay

## 2021-03-08 ENCOUNTER — Ambulatory Visit
Admission: EM | Admit: 2021-03-08 | Discharge: 2021-03-08 | Disposition: A | Discharge status: Home / Self Care | Payer: PPO | Attending: Emergency Medicine | Admitting: Emergency Medicine

## 2021-03-08 DIAGNOSIS — M5441 Lumbago with sciatica, right side: Secondary | ICD-10-CM

## 2021-03-08 MED ORDER — PREDNISONE 20 MG PO TABS: 20.0000 mg | ORAL_TABLET | Freq: Two times a day (BID) | ORAL | 0 refills | Status: AC

## 2021-03-08 MED ORDER — DEXAMETHASONE SODIUM PHOSPHATE 10 MG/ML IJ SOLN
10.0000 mg | Freq: Once | INTRAMUSCULAR | Status: AC
  Administered 2021-03-08: 10 mg via INTRAMUSCULAR

## 2021-03-08 NOTE — ED Provider Notes (Signed)
[facility]   [id number] 03/08/21 Arrival Time: 3086  CC: back PAIN  SUBJECTIVE: History from: patient. Jo Parrish is a 87 y.o. female complains of bilateral low back pain x few days ago.  Denies a precipitating event or specific injury.  Localizes the pain to the bilateral low back and radiating into RT leg.  Describes the pain as constant, but unable to characterize.  Has tried prescribed tramadol without relief.  Symptoms are made worse with weight-bearing/ walking.  Reports similar symptoms in the past that improved with shot.  Complains of RT lower leg.  Denies fever, chills, erythema, ecchymosis, effusion, weakness,  saddle paresthesias, loss of bowel or bladder function.      Currently being treated by neurologist and received injections in back.  Next appt in April, unable to be seen by neurologist today.    ROS: As per HPI.  All other pertinent ROS negative.     Past Medical History:  Diagnosis Date  . Bacterial overgrowth syndrome    Small bowel  . Complication of anesthesia   . Constipation   . Cough 09/13/2015  . GERD (gastroesophageal reflux disease)   . Hematuria 09/13/2015  . Hyperlipidemia   . Hypertension   . Hypothyroidism   . Left bundle branch block   . OSA (obstructive sleep apnea)    UNABLE TO TOLERATE  MASK  . PONV (postoperative nausea and vomiting)   . Rheumatoid arthritis(714.0)   . Sjogren's syndrome ([place])   . UI (urinary incontinence) 01/16/2016  . Ulcer, esophagus   . Urinary frequency 07/04/2015  . UTI (lower urinary tract infection) 09/13/2015  . Vaginal atrophy 08/02/2015  . Vaginal burning 07/04/2015  . Vaginal dryness 07/04/2015  . Vulvar itching 08/15/2015  . Wheezing on auscultation 09/13/2015   Past Surgical History:  Procedure Laterality Date  . APPENDECTOMY    . COLONOSCOPY  June 2014   Dr. Earlean Shawl, multiple tubular adenomas removed, internal hemorrhoids. Next colonoscopy in 3 years.  Marland Kitchen DILATION AND CURETTAGE OF UTERUS    .  ESOPHAGOGASTRODUODENOSCOPY  06/07/2008    Gastritis. No celiac sprue/Small hiatal hernia  . ESOPHAGOGASTRODUODENOSCOPY  June 2014   Dr. Earlean Shawl, erosive esophagitis, grade B., large hiatal hernia.  . Hydrogen breath test   05/17/2008   small bowel bacterial overgrowth/Hydrogen level rise consistent with small bowel bacterial  . TONSILLECTOMY    . TOTAL KNEE ARTHROPLASTY Right 10/09/2013   Procedure: RIGHT TOTAL KNEE ARTHROPLASTY;  Surgeon: Gearlean Alf, MD;  Location: [facility];  Service: Orthopedics;  Laterality: Right;   Allergies  Allergen Reactions  . Amoxicillin     Mouth sores  . Levaquin [Levofloxacin In D5w]     Mouth sores  . Prednisone     Other reaction(s): Other (See Comments)  . Statins Other (See Comments)    Leg problems  . Sucralfate     Other reaction(s): Other (See Comments) Sore mouth  . Sulfa Antibiotics Other (See Comments)    Reaction unknown  . Fenofibrate Micronized Other (See Comments)    unknown  . Tizanidine Other (See Comments)    Dry mouth, slurred speech   No current facility-administered medications on file prior to encounter.   Current Outpatient Medications on File Prior to Encounter  Medication Sig Dispense Refill  . amLODipine (NORVASC) 5 MG tablet Take 1 tablet (5 mg total) by mouth 2 (two) times daily. 180 tablet 1  . aspirin 81 MG tablet Take 81 mg every other day by mouth.    Marland Kitchen  atenolol (TENORMIN) 25 MG tablet Take 25 mg by mouth daily.     Marland Kitchen ezetimibe (ZETIA) 10 MG tablet Take 1 tablet (10 mg total) by mouth at bedtime. 90 tablet 3  . furosemide (LASIX) 20 MG tablet Take 20 mg daily if needed for swelling 90 tablet 3  . irbesartan (AVAPRO) 300 MG tablet Take 300 mg by mouth every morning.     Marland Kitchen levothyroxine (SYNTHROID) 88 MCG tablet Take 88 mcg by mouth daily.    Marland Kitchen omeprazole (PRILOSEC) 40 MG capsule Take 40 mg by mouth every morning.     . polyethylene glycol powder (GLYCOLAX) powder Take 17 g by mouth daily.     . traMADol (ULTRAM)  50 MG tablet Ultram 50 mg tablet  Take 1 tablet every day by oral route as needed for pain.    Marland Kitchen UNABLE TO FIND Injection for bones-once a year    . VITAMIN B1-B12 IJ Inject as directed every 30 (thirty) days.    . zoledronic acid (RECLAST) 5 MG/100ML SOLN injection Inject 5 mg into the vein once. yearly     Social History   Socioeconomic History  . Marital status: Widowed    Spouse name: Not on file  . Number of children: Not on file  . Years of education: Not on file  . Highest education level: Not on file  Occupational History  . Occupation: [employer]  Tobacco Use  . Smoking status: Never Smoker  . Smokeless tobacco: Never Used  Vaping Use  . Vaping Use: Never used  Substance and Sexual Activity  . Alcohol use: No  . Drug use: No  . Sexual activity: Not Currently    Birth control/protection: Post-menopausal  Other Topics Concern  . Not on file  Social History Narrative  . Not on file   Social Determinants of Health   Financial Resource Strain: Not on file  Food Insecurity: Not on file  Transportation Needs: Not on file  Physical Activity: Not on file  Stress: Not on file  Social Connections: Not on file  Intimate Partner Violence: Not on file   Family History  Problem Relation Age of Onset  . Stroke Mother   . Heart disease Father   . Liver disease Father   . COPD Son   . Sleep apnea Son   . Stroke Maternal Grandmother     OBJECTIVE:  Vitals:   03/08/21 1103  BP: 132/76  Pulse: 71  Resp: 20  Temp: 98 F (36.7 C)  SpO2: 98%    General appearance: ALERT; in no acute distress.  Head: NCAT Lungs: Normal respiratory effort Musculoskeletal: Back Inspection: Skin warm, dry, clear and intact without obvious erythema, effusion, or ecchymosis.  Palpation: Diffusely TTP over low back ROM: FROM active and passive Strength: 4+/5 RT hip flexion, 5/5 LT hip flexion Skin: warm and dry Neurologic: Ambulates with difficulty with cane Psychological: alert and  cooperative; normal mood and affect   ASSESSMENT & PLAN:  1. Acute bilateral low back pain with right-sided sciatica     Meds ordered this encounter  Medications  . dexamethasone (DECADRON) injection 10 mg  . predniSONE (DELTASONE) 20 MG tablet    Sig: Take 1 tablet (20 mg total) by mouth 2 (two) times daily with a meal for 5 days.    Dispense:  10 tablet    Refill:  0    Order Specific Question:   Supervising Provider    Answer:   Raylene Everts [[id number]]  Decadron shot given in office Continue conservative management of rest, ice, and gentle stretches Prednisone prescribed.  Take as directed and to completion Follow up with PCP if symptoms persist Return or go to the ER if you have any new or worsening symptoms (fever, chills, chest pain, abdominal pain, changes in bowel or bladder habits, pain radiating into lower legs, etc...)   Reviewed expectations re: course of current medical issues. Questions answered. Outlined signs and symptoms indicating need for more acute intervention. Patient verbalized understanding. After Visit Summary given.    Lestine Box, PA-C 03/08/21 1117

## 2021-03-08 NOTE — Discharge Instructions (Addendum)
Decadron shot given in office Continue conservative management of rest, ice, and gentle stretches Prednisone prescribed.  Take as directed and to completion Follow up with PCP if symptoms persist Return or go to the ER if you have any new or worsening symptoms (fever, chills, chest pain, abdominal pain, changes in bowel or bladder habits, pain radiating into lower legs, etc...)

## 2021-03-08 NOTE — ED Triage Notes (Signed)
PT received hip injection about a month ago and has developed severe pain in right leg, neurologist did not have appointment and suggested she come here

## 2021-03-15 DIAGNOSIS — E039 Hypothyroidism, unspecified: Secondary | ICD-10-CM | POA: Diagnosis not present

## 2021-03-15 DIAGNOSIS — I10 Essential (primary) hypertension: Secondary | ICD-10-CM | POA: Diagnosis not present

## 2021-03-15 DIAGNOSIS — E781 Pure hyperglyceridemia: Secondary | ICD-10-CM | POA: Diagnosis not present

## 2021-03-23 DIAGNOSIS — E063 Autoimmune thyroiditis: Secondary | ICD-10-CM | POA: Diagnosis not present

## 2021-03-23 DIAGNOSIS — Z6826 Body mass index (BMI) 26.0-26.9, adult: Secondary | ICD-10-CM | POA: Diagnosis not present

## 2021-03-23 DIAGNOSIS — M1991 Primary osteoarthritis, unspecified site: Secondary | ICD-10-CM | POA: Diagnosis not present

## 2021-03-23 DIAGNOSIS — E538 Deficiency of other specified B group vitamins: Secondary | ICD-10-CM | POA: Diagnosis not present

## 2021-03-23 DIAGNOSIS — M47816 Spondylosis without myelopathy or radiculopathy, lumbar region: Secondary | ICD-10-CM | POA: Diagnosis not present

## 2021-03-23 DIAGNOSIS — M461 Sacroiliitis, not elsewhere classified: Secondary | ICD-10-CM | POA: Diagnosis not present

## 2021-03-23 DIAGNOSIS — I10 Essential (primary) hypertension: Secondary | ICD-10-CM | POA: Diagnosis not present

## 2021-03-29 DIAGNOSIS — M47816 Spondylosis without myelopathy or radiculopathy, lumbar region: Secondary | ICD-10-CM | POA: Diagnosis not present

## 2021-03-29 DIAGNOSIS — M5136 Other intervertebral disc degeneration, lumbar region: Secondary | ICD-10-CM | POA: Diagnosis not present

## 2021-03-29 DIAGNOSIS — M25551 Pain in right hip: Secondary | ICD-10-CM | POA: Diagnosis not present

## 2021-03-29 DIAGNOSIS — M5416 Radiculopathy, lumbar region: Secondary | ICD-10-CM | POA: Diagnosis not present

## 2021-04-11 DIAGNOSIS — M5416 Radiculopathy, lumbar region: Secondary | ICD-10-CM | POA: Diagnosis not present

## 2021-04-15 DIAGNOSIS — I110 Hypertensive heart disease with heart failure: Secondary | ICD-10-CM | POA: Diagnosis not present

## 2021-04-15 DIAGNOSIS — E063 Autoimmune thyroiditis: Secondary | ICD-10-CM | POA: Diagnosis not present

## 2021-04-15 DIAGNOSIS — I5032 Chronic diastolic (congestive) heart failure: Secondary | ICD-10-CM | POA: Diagnosis not present

## 2021-05-01 DIAGNOSIS — E538 Deficiency of other specified B group vitamins: Secondary | ICD-10-CM | POA: Diagnosis not present

## 2021-05-01 DIAGNOSIS — M1991 Primary osteoarthritis, unspecified site: Secondary | ICD-10-CM | POA: Diagnosis not present

## 2021-05-01 DIAGNOSIS — E663 Overweight: Secondary | ICD-10-CM | POA: Diagnosis not present

## 2021-05-01 DIAGNOSIS — Z6826 Body mass index (BMI) 26.0-26.9, adult: Secondary | ICD-10-CM | POA: Diagnosis not present

## 2021-05-01 DIAGNOSIS — G629 Polyneuropathy, unspecified: Secondary | ICD-10-CM | POA: Diagnosis not present

## 2021-05-01 DIAGNOSIS — M47816 Spondylosis without myelopathy or radiculopathy, lumbar region: Secondary | ICD-10-CM | POA: Diagnosis not present

## 2021-05-01 DIAGNOSIS — I10 Essential (primary) hypertension: Secondary | ICD-10-CM | POA: Diagnosis not present

## 2021-05-16 ENCOUNTER — Other Ambulatory Visit: Payer: Self-pay

## 2021-05-16 ENCOUNTER — Ambulatory Visit: Payer: PPO | Admitting: Podiatry

## 2021-05-16 DIAGNOSIS — L600 Ingrowing nail: Secondary | ICD-10-CM | POA: Diagnosis not present

## 2021-05-16 DIAGNOSIS — M79676 Pain in unspecified toe(s): Secondary | ICD-10-CM | POA: Diagnosis not present

## 2021-05-16 DIAGNOSIS — E063 Autoimmune thyroiditis: Secondary | ICD-10-CM | POA: Diagnosis not present

## 2021-05-16 DIAGNOSIS — M79661 Pain in right lower leg: Secondary | ICD-10-CM

## 2021-05-16 DIAGNOSIS — I110 Hypertensive heart disease with heart failure: Secondary | ICD-10-CM | POA: Diagnosis not present

## 2021-05-16 DIAGNOSIS — I5032 Chronic diastolic (congestive) heart failure: Secondary | ICD-10-CM | POA: Diagnosis not present

## 2021-05-16 NOTE — Progress Notes (Signed)
  Subjective:  Patient ID: Jo Parrish, female    DOB: 05/05/1926,  MRN: [id number]  Chief Complaint  Patient presents with  . Ingrown Toenail    Bilateral great ingrown toenail x 1 month. Painful with some redness. No drainage or bleeding.    87 y.o. female presents with the above complaint. History confirmed with patient.   Objective:  Physical Exam: warm, good capillary refill, no trophic changes or ulcerative lesions, normal DP and PT pulses and normal sensory exam.  Painful ingrowing nail at both borders of the left, right, hallux; without warmth, erythema or drainage  POP right calf muscle. No firm nodules. No warmth, erythema, SOI.  Assessment:   1. Ingrown nail   2. Pain around toenail   3. Right calf pain    Plan:  Patient was evaluated and treated and all questions answered.  Ingrown Nail, bilateral -Palliative debridement of ingrowing nails in slant-back fashion to patient relief.   Right calf pain -Would recommend ST [place] to eval for strain vs rule out DVT. Patient has appt tomorrow for f/u. Should she need I will be happy to further help diagnose this.  Return in about 4 weeks (around 06/13/2021) for Ingrown nail, calf pain f/u.

## 2021-05-17 ENCOUNTER — Ambulatory Visit (HOSPITAL_COMMUNITY)
Admission: RE | Admit: 2021-05-17 | Discharge: 2021-05-17 | Disposition: A | Discharge status: Home / Self Care | Payer: PPO | Source: Ambulatory Visit | Attending: Neurosurgery | Admitting: Neurosurgery

## 2021-05-17 ENCOUNTER — Other Ambulatory Visit (HOSPITAL_COMMUNITY): Payer: Self-pay | Admitting: Neurosurgery

## 2021-05-17 DIAGNOSIS — M79661 Pain in right lower leg: Secondary | ICD-10-CM

## 2021-05-17 DIAGNOSIS — M5136 Other intervertebral disc degeneration, lumbar region: Secondary | ICD-10-CM | POA: Diagnosis not present

## 2021-05-17 DIAGNOSIS — M47816 Spondylosis without myelopathy or radiculopathy, lumbar region: Secondary | ICD-10-CM | POA: Diagnosis not present

## 2021-05-17 DIAGNOSIS — M5416 Radiculopathy, lumbar region: Secondary | ICD-10-CM | POA: Diagnosis not present

## 2021-05-19 ENCOUNTER — Ambulatory Visit: Payer: PPO | Admitting: Podiatry

## 2021-05-23 ENCOUNTER — Ambulatory Visit: Payer: PPO | Admitting: Podiatry

## 2021-05-24 ENCOUNTER — Ambulatory Visit: Payer: PPO | Admitting: Family Medicine

## 2021-05-24 ENCOUNTER — Encounter: Payer: Self-pay | Admitting: Family Medicine

## 2021-05-24 ENCOUNTER — Other Ambulatory Visit: Payer: Self-pay

## 2021-05-24 VITALS — BP 129/65 | Ht 62.0 in | Wt 150.0 lb

## 2021-05-24 DIAGNOSIS — G609 Hereditary and idiopathic neuropathy, unspecified: Secondary | ICD-10-CM | POA: Diagnosis not present

## 2021-05-24 MED ORDER — METHYLPREDNISOLONE ACETATE 40 MG/ML IJ SUSP
40.0000 mg | Freq: Once | INTRAMUSCULAR | Status: AC
  Administered 2021-05-24: 40 mg via INTRAMUSCULAR

## 2021-05-24 NOTE — Progress Notes (Signed)
PCP: Redmond School, MD  Subjective:   HPI: Patient is a 87 y.o. female with history of bilateral lumbar radiculopathy which is being managed by [facility], and reported distant history of rheumatoid arthritis and Sjogren's syndrome who is here for evaluation of predominantly right but also left lower leg numbness and tingling.  She reports that over the last several months, she has had progressively worsening pain in bilateral lower extremities.  She was eventually seen by [facility] for this and diagnosed with lumbar radiculopathy.  She has undergone multiple epidural corticosteroid injections with some benefit, however over the last month or so she has had worsening pain, numbness and tingling in just her distal lower extremities.  It started on her right with pain over the lateral aspect of her calf and extending down the back of her leg to the bottom of her foot.  She reports that she has minimal sensation on the bottom of her foot now.  She also began to have similar symptoms on her left side mostly over her posterior calf and plantar foot.  She reports that she saw her primary care doctor, who suspected possible peripheral neuropathy.  She reports that he got multiple labs, and can recall that he ordered a B12, checked her thyroid, among other labs.  Reportedly they were all normal although I do not have them in our system.  She was prescribed gabapentin, however has not taken this due to concerns about side effects.  She is also been taking Tylenol which is somewhat helpful.  Since this started, she has been seen by multiple providers, including [facility], [facility], and her primary care doctor.  She has also been seen at multiple urgent cares and emergency departments.  She reports that the only thing has helped is an IM injection of steroid which helped briefly.   Review of Systems:  Per HPI.   [place], medications and smoking status reviewed.       Objective:  Physical Exam:  No flowsheet data found.   Gen: awake, alert, NAD, comfortable in exam room Pulm: breathing unlabored  Bilateral lower extremities: -Inspection: Normal lordosis of lumbar spine.  She does not have any significant erythema, edema and ecchymoses or atrophy of bilateral lower extremity musculature.  Some intermittent fasciculations in the right gastroc during exam. -Palpation: Tender to palpation diffusely across right lateral lower leg extending into the plantar foot.  Mildly tender in left calf, mostly lateral, and plantar foot.  No significant masses.  Mildly tender with reproduction of some symptoms on palpation of the peroneal nerve over the fibular head on the right. -ROM: Full range of motion of the knee foot and ankle on bilateral lower extremities.  She does have full range of motion of the lumbar spine though has some pain with forward flexion. -Strength: 5/5 strength with plantar flexion, dorsiflexion, eversion, inversion of bilateral ankles.  5/5 strength with flexion extension of the knee. -Neuro: Decreased but equal reflexes in bilateral Achilles, patellar, hamstring reflexes.  Decreased sensation over right lateral lower leg and minimal sensation in bilateral plantar foot. -Normal dorsalis pedis and posterior tibialis pulses bilaterally.   Assessment & Plan:  1.  Peripheral neuropathy, right worse than left Differential for patient's symptoms include idiopathic peripheral neuropathy, peripheral neuropathy secondary to organic cause (hypothyroidism, B12 deficiency, etc.), lumbar radiculopathy, peroneal nerve entrapment.  I believe this is idiopathic peripheral neuropathy given that it spares her thighs and is bilateral, and also includes the  soles of the feet.  That being said, lumbar radiculopathy cannot be ruled out.  Per patient's report, it sounds like her primary care doctor has already worked her up for organic causes of peripheral  neuropathy.  Plan: -Encouraged to start gabapentin 100 mg at night and titrating up as prescribed by her primary care doctor -Continue Tylenol and add ibuprofen 400 mg 3 times daily -Gentle ankle band exercises given to prevent muscle atrophy and weakness -IM Depo-Medrol given today Follow-up 2 to 3 weeks  Dagoberto Ligas, MD [facility] 05/24/2021 2:14 PM  I was the preceptor for this visit and available for immediate consultation Shellia Cleverly, DO

## 2021-05-24 NOTE — Patient Instructions (Signed)
Thank you for coming in to see [place] today!  I believe you have peripheral neuropathy, which could be idiopathic peripheral neuropathy, or could be referred pain from your back.  Please see below to review our plan for today's visit:   1. Please start taking tylenol 1000mg  2-3 times daily and ibuprofen 400mg  2-3 times daily. 2.   Please also start taking gabapentin 100 mg at night, and increase to 100 mg twice a day if you do not have any negative side effects. 3.   Please also start the ankle exercises that were shown to you today.  Lets plan to follow-up in 2 to 3 weeks to check back in.   Please call the clinic at [phone] if your symptoms worsen or you have any concerns. It was our pleasure to serve you.       Dr. Dagoberto Ligas [facility]

## 2021-05-29 ENCOUNTER — Encounter: Payer: Self-pay | Admitting: Family Medicine

## 2021-05-31 ENCOUNTER — Other Ambulatory Visit: Payer: Self-pay

## 2021-05-31 ENCOUNTER — Encounter: Payer: Self-pay | Admitting: Family Medicine

## 2021-05-31 ENCOUNTER — Ambulatory Visit (INDEPENDENT_AMBULATORY_CARE_PROVIDER_SITE_OTHER): Payer: PPO | Admitting: Family Medicine

## 2021-05-31 VITALS — BP 127/74 | Ht 62.0 in | Wt 150.0 lb

## 2021-05-31 DIAGNOSIS — G609 Hereditary and idiopathic neuropathy, unspecified: Secondary | ICD-10-CM

## 2021-05-31 NOTE — Progress Notes (Signed)
   PCP: Redmond School, MD  Subjective:   HPI: Patient is a 87 y.o. female here for reevaluation of right worse than left lower extremity numbness and tingling, and right arm numbness and tingling.  Please see previous note from 05/24/2021 for complete history.  Since that visit, patient reports that she did try the gabapentin and had significant side effects including drowsiness with difficulty actually falling asleep and staying asleep, and stomach upset.  She tried to take it for several days before stopping.  She is continue to have significant pain and did not feel like the gabapentin was very helpful when she was taking it.  She has been doing the ankle band exercises as prescribed.  She is wondering if there is anything else that can be done.   Review of Systems:  Per HPI.   [place], medications and smoking status reviewed.      Objective:  Physical Exam:  No flowsheet data found.   Gen: awake, alert, NAD, comfortable in exam room Pulm: breathing unlabored  Bilateral lower extremities: -Inspection: Normal lordosis of lumbar spine.  She does not have any significant erythema, edema and ecchymoses or atrophy of bilateral lower extremity musculature.  Some intermittent fasciculations in the right gastroc during exam. -Palpation: Tender to palpation diffusely across right lateral lower leg extending into the plantar foot.  Mildly tender in left calf, mostly lateral, and plantar foot.  No significant masses.  -ROM: Full range of motion of the knee foot and ankle on bilateral lower extremities.  She does have full range of motion of the lumbar spine though has some pain with forward flexion. -Strength: 5/5 strength with plantar flexion, dorsiflexion, eversion, inversion of bilateral ankles.  5/5 strength with flexion extension of the knee. -Neuro: Decreased but equal reflexes in bilateral Achilles, patellar, hamstring reflexes.  Decreased sensation over right lateral lower leg and minimal  sensation in bilateral plantar foot. -Normal dorsalis pedis and posterior tibialis pulses bilaterally.    Assessment & Plan:  1.  Lower extremity peripheral neuropathy, right worse than left  Differential continues to include idiopathic peripheral neuropathy, lumbar radiculopathy.  I did receive patient's lab work that was done at her primary care doctor, which did include a complete work-up of potential causes for peripheral neuropathy and was normal.  It continues to be possible that she does have idiopathic peripheral neuropathy, however the significant difference between right and left does continue to raise a question of lumbar radiculopathy.  She also notes that she had very transient but significant relief with epidural steroid injections and lumbar spine which supports lumbar radiculopathy.  With this in mind, and her intolerance of gabapentin, she is planning to follow-up with [facility] to discuss possible further interventions.  Unfortunately, I will think there is too much more I can offer her at this time.  She is welcome to follow-up as needed to discuss further.   Dagoberto Ligas, MD [facility] Fellow 05/31/2021 3:22 PM  Addendum:  I was the preceptor for this visit and available for immediate consultation.  Karlton Lemon MD Kirt Boys

## 2021-06-08 DIAGNOSIS — M5136 Other intervertebral disc degeneration, lumbar region: Secondary | ICD-10-CM | POA: Diagnosis not present

## 2021-06-08 DIAGNOSIS — M25511 Pain in right shoulder: Secondary | ICD-10-CM | POA: Diagnosis not present

## 2021-06-08 DIAGNOSIS — M79661 Pain in right lower leg: Secondary | ICD-10-CM | POA: Diagnosis not present

## 2021-06-08 DIAGNOSIS — M5416 Radiculopathy, lumbar region: Secondary | ICD-10-CM | POA: Diagnosis not present

## 2021-06-13 ENCOUNTER — Ambulatory Visit: Payer: PPO | Admitting: Podiatry

## 2021-06-14 ENCOUNTER — Ambulatory Visit: Payer: PPO | Admitting: Family Medicine

## 2021-06-16 DIAGNOSIS — Z6827 Body mass index (BMI) 27.0-27.9, adult: Secondary | ICD-10-CM | POA: Diagnosis not present

## 2021-06-16 DIAGNOSIS — M1991 Primary osteoarthritis, unspecified site: Secondary | ICD-10-CM | POA: Diagnosis not present

## 2021-06-16 DIAGNOSIS — E538 Deficiency of other specified B group vitamins: Secondary | ICD-10-CM | POA: Diagnosis not present

## 2021-06-16 DIAGNOSIS — R5383 Other fatigue: Secondary | ICD-10-CM | POA: Diagnosis not present

## 2021-06-16 DIAGNOSIS — G4733 Obstructive sleep apnea (adult) (pediatric): Secondary | ICD-10-CM | POA: Diagnosis not present

## 2021-06-16 DIAGNOSIS — G4762 Sleep related leg cramps: Secondary | ICD-10-CM | POA: Diagnosis not present

## 2021-06-16 DIAGNOSIS — I10 Essential (primary) hypertension: Secondary | ICD-10-CM | POA: Diagnosis not present

## 2021-06-16 DIAGNOSIS — E063 Autoimmune thyroiditis: Secondary | ICD-10-CM | POA: Diagnosis not present

## 2021-06-28 NOTE — Progress Notes (Signed)
Office Visit Note  Patient: Jo Parrish             Date of Birth: [birth year]           MRN: [id number]             PCP: Redmond School, MD Referring: Redmond School, MD Visit Date: 07/12/2021 Occupation: @GUAROCC @  Subjective:  Right shoulder and right knee.   History of Present Illness: [name] is a 87 y.o. female seen in consultation per request of her PCP.  According the patient at age 55 she started experiencing salty taste.  She states she was seen at the [facility] where she was given the diagnosis of rheumatoid arthritis and Sjogren's.  She went for second opinion to [facility] who denied the diagnosis of autoimmune disease and told her that her symptoms were related to stress.  She was also seen by dermatologist who did not agree with the diagnosis of autoimmune disease.  She states she has had discomfort in her knee joints for the last few years.  In 2014 she underwent right total knee replacement by Dr.Alusio.  She states even after the surgery her right knee joint continues to hurt.  She also started having severe nerve pain in her right lower extremity which persists.  She also has some neuropathy in her left lower extremity.  She had been going to [place] [facility] for lower back pain.  She was diagnosed with disc disease of the lumbar spine and has had cortisone injection frequently to her back.  She was recently seen at [facility] due to the right lower extremity pain.  She had x-rays of her lumbar spine, right knee joint and right ankle joint.  She was referred to Dr. Nelva Bush who did back injections yesterday per patient.  She gives history of pain and discomfort in her bilateral shoulders for the last 2 months.  She states she has difficulty lifting her right arm.  She also gives history of discomfort in her elbows and her both knees.  She has intermittent edema.  She denies any history of joint swelling.  He gives history of dry mouth and dry eyes.  There is  no history of oral ulcers, nasal ulcers, malar rash, photosensitivity, Raynauds or lymphadenopathy.  She gives history of myalgias and chronic insomnia.  She is gravida 1, para 1, miscarriages 0.  She denies any history of DVTs.  Activities of Daily Living:  Patient reports morning stiffness for 2-3 hours.   Patient Reports nocturnal pain.  Difficulty dressing/grooming: Denies Difficulty climbing stairs: Reports Difficulty getting out of chair: Reports Difficulty using hands for taps, buttons, cutlery, and/or writing: Reports  Review of Systems  Constitutional:  Positive for fatigue.  HENT:  Positive for mouth dryness and nose dryness. Negative for mouth sores.   Eyes:  Positive for dryness. Negative for pain and itching.  Respiratory:  Positive for shortness of breath. Negative for difficulty breathing.   Cardiovascular:  Positive for palpitations and swelling in legs/feet. Negative for chest pain.  Gastrointestinal:  Positive for constipation. Negative for blood in stool and diarrhea.  Endocrine: Negative for increased urination.  Genitourinary:  Negative for difficulty urinating.  Musculoskeletal:  Positive for joint pain, joint pain, myalgias, morning stiffness, muscle tenderness and myalgias. Negative for joint swelling.  Skin:  Negative for color change, rash, redness and sensitivity to sunlight.  Allergic/Immunologic: Negative for susceptible to infections.  Neurological:  Positive for numbness and weakness. Negative for dizziness  and headaches.  Hematological:  Negative for bruising/bleeding tendency and swollen glands.  Psychiatric/Behavioral:  Positive for sleep disturbance. Negative for depressed mood. The patient is not nervous/anxious.    PMFS History:  Patient Active Problem List   Diagnosis Date Noted   Mild aortic stenosis 12/12/2018   UI (urinary incontinence) 01/16/2016   UTI (lower urinary tract infection) 09/13/2015   Hematuria 09/13/2015   Cough 09/13/2015    Wheezing on auscultation 09/13/2015   Vulvar itching 08/15/2015   Vaginal atrophy 08/02/2015   Urinary frequency 07/04/2015   Vaginal burning 07/04/2015   Vaginal dryness 07/04/2015   Rash, legs 06/21/2015   Constipation 01/07/2014   Anemia 10/20/2013   Heme positive stool 10/20/2013   Abnormal LFTs 10/20/2013   Nausea alone 10/20/2013   Cellulitis of leg, right 10/16/2013   Unspecified constipation 10/16/2013   Postoperative anemia due to acute blood loss 10/12/2013   Hyponatremia 10/12/2013   OA (osteoarthritis) of knee 10/09/2013   Heart palpitations 10/07/2013   Hypothyroidism 08/13/2013   Chronic diastolic heart failure ([place]) 08/06/2013   Essential hypertension 08/06/2013   Hyperlipidemia 08/06/2013   Knee pain 03/03/2013    Past Medical History:  Diagnosis Date   Bacterial overgrowth syndrome    Small bowel   Complication of anesthesia    Constipation    Cough 09/13/2015   GERD (gastroesophageal reflux disease)    Hematuria 09/13/2015   Hyperlipidemia    Hypertension    Hypothyroidism    Left bundle branch block    OSA (obstructive sleep apnea)    UNABLE TO TOLERATE  MASK   PONV (postoperative nausea and vomiting)    Rheumatoid arthritis(714.0)    Sjogren's syndrome (HCC)    UI (urinary incontinence) 01/16/2016   Ulcer, esophagus    Urinary frequency 07/04/2015   UTI (lower urinary tract infection) 09/13/2015   Vaginal atrophy 08/02/2015   Vaginal burning 07/04/2015   Vaginal dryness 07/04/2015   Vulvar itching 08/15/2015   Wheezing on auscultation 09/13/2015    Family History  Problem Relation Age of Onset   Stroke Mother    Heart disease Father    Liver disease Father    COPD Son    Sleep apnea Son    Stroke Maternal Grandmother    Past Surgical History:  Procedure Laterality Date   APPENDECTOMY     COLONOSCOPY  June 2014   Dr. Earlean Shawl, multiple tubular adenomas removed, internal hemorrhoids. Next colonoscopy in 3 years.   DILATION AND CURETTAGE OF  UTERUS     ESOPHAGOGASTRODUODENOSCOPY  06/07/2008    Gastritis. No celiac sprue/Small hiatal hernia   ESOPHAGOGASTRODUODENOSCOPY  June 2014   Dr. Earlean Shawl, erosive esophagitis, grade B., large hiatal hernia.   Hydrogen breath test   05/17/2008   small bowel bacterial overgrowth/Hydrogen level rise consistent with small bowel bacterial   TONSILLECTOMY     TOTAL KNEE ARTHROPLASTY Right 10/09/2013   Procedure: RIGHT TOTAL KNEE ARTHROPLASTY;  Surgeon: Gearlean Alf, MD;  Location: [facility];  Service: Orthopedics;  Laterality: Right;   Social History   Social History Narrative   Not on file   Immunization History  Administered Date(s) Administered   Moderna Sars-Covid-2 Vaccination 01/08/2020, 02/03/2020, 10/29/2020     Objective: Vital Signs: BP 137/81 (BP Location: Right Arm, Patient Position: Sitting, Cuff Size: Normal)   Pulse 87   Ht 5\' 3"  (1.6 m)   Wt 156 lb (70.8 kg)   BMI 27.63 kg/m    Physical Exam Vitals and nursing note  reviewed.  Constitutional:      Appearance: She is well-developed.  HENT:     Head: Normocephalic and atraumatic.  Eyes:     Conjunctiva/sclera: Conjunctivae normal.  Cardiovascular:     Rate and Rhythm: Normal rate and regular rhythm.     Heart sounds: Normal heart sounds.  Pulmonary:     Effort: Pulmonary effort is normal.     Breath sounds: Normal breath sounds.  Abdominal:     General: Bowel sounds are normal.     Palpations: Abdomen is soft.  Musculoskeletal:     Cervical back: Normal range of motion.     Right lower leg: Edema present.     Left lower leg: Edema present.     Comments: Mild pitting edema was noted over bilateral lower extremities.  Lymphadenopathy:     Cervical: No cervical adenopathy.  Skin:    General: Skin is warm and dry.     Capillary Refill: Capillary refill takes less than 2 seconds.  Neurological:     Mental Status: She is alert and oriented to person, place, and time.  Psychiatric:        Behavior: Behavior  normal.     Musculoskeletal Exam: She had limited lateral rotation of the cervical spine without any discomfort.  She had painful range of motion of her lumbar spine.  There is no SI joint tenderness.  Right shoulder joint abduction was limited to 100 degrees.  She also had painful internal rotation.  Left shoulder joint was in full range of motion.  There was no tenderness or swelling or elbow joints.  She had bilateral PIP and DIP thickening with no synovitis.  No synovitis was noted over MCPs or wrist joints.  Hip joints with good range of motion.  Right knee joint was replaced without any effusion.  She had limited extension of her right knee joint.  She had good range of motion of her left knee joint without any warmth swelling or effusion.  There was no tenderness over ankles or MTPs.  CDAI Exam: CDAI Score: -- Patient Global: --; Provider Global: -- Swollen: --; Tender: -- Joint Exam 07/12/2021   No joint exam has been documented for this visit   There is currently no information documented on the homunculus. Go to the Rheumatology activity and complete the homunculus joint exam.  Investigation: No additional findings.  Imaging: XR Shoulder Right  Result Date: 07/12/2021 No glenohumeral joint space narrowing was noted.  Large inferior humeral head spur was noted.  Mild acromioclavicular tearing was noted.  No chondrocalcinosis was noted.  Extra-articular calcification was noted close to the proximal humerus shaft. Impression: These findings are consistent with osteoarthritis of the shoulder joint.Marland Kitchen  VAS [place] LOWER EXTREMITY VENOUS (DVT)  Result Date: 07/06/2021  Lower Venous DVT Study Patient Name:  MAKYLIE RIVERE  Date of Exam:   07/06/2021 Medical Rec #: [id number]      Accession #:    [id number] Date of Birth: 06-Mar-1926       Patient Gender: F Patient Age:   89Y Exam Location:  [facility] Procedure:      VAS [place] LOWER EXTREMITY VENOUS (DVT) Referring Phys: [id number] ADAM S KENDALL  --------------------------------------------------------------------------------  Indications: Pain, and Swelling. Other Indications: Patient complains of right calf pain since April 2022. She                    denies any shortness of breath. Risk Factors: None identified. Performing Technologist: Wilkie Aye RVT  Examination Guidelines: A complete evaluation includes B-mode imaging, spectral Doppler, color Doppler, and power Doppler as needed of all accessible portions of each vessel. Bilateral testing is considered an integral part of a complete examination. Limited examinations for reoccurring indications may be performed as noted. The reflux portion of the exam is performed with the patient in reverse Trendelenburg.  +---------+---------------+---------+-----------+----------+--------------+ RIGHT    CompressibilityPhasicitySpontaneityPropertiesThrombus Aging +---------+---------------+---------+-----------+----------+--------------+ CFV      Full           Yes      Yes                                 +---------+---------------+---------+-----------+----------+--------------+ SFJ      Full           Yes      Yes                                 +---------+---------------+---------+-----------+----------+--------------+ FV Prox  Full           Yes      Yes                                 +---------+---------------+---------+-----------+----------+--------------+ FV Mid   Full           Yes      Yes                                 +---------+---------------+---------+-----------+----------+--------------+ FV DistalFull           Yes      Yes                                 +---------+---------------+---------+-----------+----------+--------------+ PFV      Full                                                        +---------+---------------+---------+-----------+----------+--------------+ POP      Full           Yes      Yes                                  +---------+---------------+---------+-----------+----------+--------------+ PTV      Full           Yes      Yes                                 +---------+---------------+---------+-----------+----------+--------------+ PERO     Full           Yes      Yes                                 +---------+---------------+---------+-----------+----------+--------------+ Gastroc  Full                                                        +---------+---------------+---------+-----------+----------+--------------+  GSV      Full           Yes      Yes                                 +---------+---------------+---------+-----------+----------+--------------+   +----+---------------+---------+-----------+----------+--------------+ LEFTCompressibilityPhasicitySpontaneityPropertiesThrombus Aging +----+---------------+---------+-----------+----------+--------------+ CFV Full           Yes      Yes                                 +----+---------------+---------+-----------+----------+--------------+    Summary: RIGHT: - No evidence of deep vein thrombosis in the lower extremity. No indirect evidence of obstruction proximal to the inguinal ligament. - No cystic structure found in the popliteal fossa.  LEFT: - No evidence of common femoral vein obstruction.  *See table(s) above for measurements and observations. Electronically signed by Larae Grooms MD on 07/06/2021 at 10:19:35 PM.    Final     Recent Labs: Lab Results  Component Value Date   WBC 5.5 06/29/2021   HGB 12.8 06/29/2021   PLT 206 06/29/2021   NA 132 (L) 06/29/2021   K 4.4 06/29/2021   CL 100 06/29/2021   CO2 26 06/29/2021   GLUCOSE 118 (H) 06/29/2021   BUN 11 06/29/2021   CREATININE 0.71 06/29/2021   BILITOT 0.6 06/29/2021   ALKPHOS 40 06/29/2021   AST 22 06/29/2021   ALT 20 06/29/2021   PROT 7.0 06/29/2021   ALBUMIN 4.2 06/29/2021   CALCIUM 8.8 (L) 06/29/2021   GFRAA 58 (L) 06/11/2018    Speciality  Comments: Reclast 2017-2022  Procedures:  Large Joint Inj: R glenohumeral on 07/12/2021 9:48 AM Indications: pain Details: 27 G 1.5 in needle, posterior approach  Arthrogram: No  Medications: 1.5 mL lidocaine 1 %; 40 mg triamcinolone acetonide 40 MG/ML Aspirate: 0 mL Outcome: tolerated well, no immediate complications Procedure, treatment alternatives, risks and benefits explained, specific risks discussed. Consent was given by the patient. Immediately prior to procedure a time out was called to verify the correct patient, procedure, equipment, support staff and site/side marked as required. Patient was prepped and draped in the usual sterile fashion.    Allergies: Amoxicillin, Gabapentin, Levaquin [levofloxacin in d5w], Prednisone, Statins, Sucralfate, Sulfa antibiotics, Tramadol, Fenofibrate micronized, and Tizanidine   Assessment / Plan:     Visit Diagnoses: Polyarthralgia-patient complains of pain and discomfort in multiple joints today.  She did not have any synovitis on examination.  Sicca complex (HCC)-she gives history of dry mouth and dry eyes for many years.  She states she was diagnosed with Sjogren's at [facility].  Then she was evaluated at [facility] where she was told that she did not have Sjogren's.  She continues to have dry mouth and dry eyes symptoms which are manageable.  I offered autoimmune work-up which she declined.  Chronic pain of both shoulders -she has been having pain in her both shoulders for the last 2 months.  She states right shoulder joint has been worse to the point she is having difficulty getting dressed and raising her arm.  She is also having nocturnal pain.  Plan: XR Shoulder Right.  The x-ray today showed osteoarthritic changes.  Inferior spurring of the humeral head was noted.  Per patient's request right shoulder joint was injected with cortisone as described above.  She tolerated the procedure well.  Postprocedure instructions were given.  I also  gave her a handout on shoulder joint exercises.  If she has an adequate response I may consider sending her to physical therapy.  Status post total knee replacement, right-2014 by Dr. Wynelle Link.  She continues to have discomfort in her bilateral knee joints.  She has been evaluated at [facility].  DDD (degenerative disc disease), lumbar-she gives history of degenerative disc disease of lumbar spine for many years.  She states she used to get frequent cortisone injections at [place] [facility] and recently she saw Dr. Nelva Bush and had injections in her back by Dr. Nelva Bush.  Neuropathy-she complains of neuropathy in her bilateral lower extremity more prominent on the right side.  She has an appointment coming up with the neurologist in August.  Age-related osteoporosis without current pathological fracture -she gives history of osteoporosis for many years.  She was treated with Reclast treated from 2015 through 2022.  She is currently on drug holiday.  Calcium rich diet and vitamin D was discussed.  Fibromyalgia-she gives history of generalized pain, hyperalgesia and positive tender points.  Chronic pain syndrome-she gives history of chronic pain for multiple years.  History of IBS-patient gives history of IBS and constipation.  She states she has to watch her diet carefully.  Essential hypertension-blood pressure was normal today.  She is on amlodipine and atenolol.  Chronic diastolic heart failure (HCC)  Mild aortic stenosis  History of hypothyroidism  History of hyperlipidemia  Vitamin B12 deficiency-she states that her most recent vitamin B-12 level was normal.  Orders: Orders Placed This Encounter  Procedures   Large Joint Inj   XR Shoulder Right    No orders of the defined types were placed in this encounter.    Follow-Up Instructions: Return for Pain in joints.   Bo Merino, MD  Note - This record has been created using Editor, commissioning.  Chart creation errors have been  sought, but may not always  have been located. Such creation errors do not reflect on  the standard of medical care.

## 2021-06-29 ENCOUNTER — Emergency Department (HOSPITAL_COMMUNITY)
Admission: EM | Admit: 2021-06-29 | Discharge: 2021-06-29 | Disposition: A | Discharge status: Home / Self Care | Payer: PPO | Attending: Physician Assistant | Admitting: Physician Assistant

## 2021-06-29 ENCOUNTER — Other Ambulatory Visit: Payer: Self-pay

## 2021-06-29 ENCOUNTER — Encounter (HOSPITAL_COMMUNITY): Payer: Self-pay

## 2021-06-29 DIAGNOSIS — Z5321 Procedure and treatment not carried out due to patient leaving prior to being seen by health care provider: Secondary | ICD-10-CM | POA: Diagnosis not present

## 2021-06-29 DIAGNOSIS — R42 Dizziness and giddiness: Secondary | ICD-10-CM | POA: Insufficient documentation

## 2021-06-29 LAB — COMPREHENSIVE METABOLIC PANEL
ALT: 20 U/L (ref 0–44)
AST: 22 U/L (ref 15–41)
Albumin: 4.2 g/dL (ref 3.5–5.0)
Alkaline Phosphatase: 40 U/L (ref 38–126)
Anion gap: 6 (ref 5–15)
BUN: 11 mg/dL (ref 8–23)
CO2: 26 mmol/L (ref 22–32)
Calcium: 8.8 mg/dL — ABNORMAL LOW (ref 8.9–10.3)
Chloride: 100 mmol/L (ref 98–111)
Creatinine, Ser: 0.71 mg/dL (ref 0.44–1.00)
GFR, Estimated: >60 mL/min (ref >60)
Glucose, Bld: 118 mg/dL — ABNORMAL HIGH (ref 70–99)
Potassium: 4.4 mmol/L (ref 3.5–5.1)
Sodium: 132 mmol/L — ABNORMAL LOW (ref 135–145)
Total Bilirubin: 0.6 mg/dL (ref 0.3–1.2)
Total Protein: 7 g/dL (ref 6.5–8.1)

## 2021-06-29 LAB — CBC WITH DIFFERENTIAL/PLATELET
Abs Immature Granulocytes: 0.01 10*3/uL (ref 0.00–0.07)
Basophils Absolute: 0 10*3/uL (ref 0.0–0.1)
Basophils Relative: 1 %
Eosinophils Absolute: 0.1 10*3/uL (ref 0.0–0.5)
Eosinophils Relative: 2 %
HCT: 37.4 % (ref 36.0–46.0)
Hemoglobin: 12.8 g/dL (ref 12.0–15.0)
Immature Granulocytes: 0 %
Lymphocytes Relative: 35 %
Lymphs Abs: 1.9 10*3/uL (ref 0.7–4.0)
MCH: 33.4 pg (ref 26.0–34.0)
MCHC: 34.2 g/dL (ref 30.0–36.0)
MCV: 97.7 fL (ref 80.0–100.0)
Monocytes Absolute: 0.5 10*3/uL (ref 0.1–1.0)
Monocytes Relative: 9 %
Neutro Abs: 2.9 10*3/uL (ref 1.7–7.7)
Neutrophils Relative %: 53 %
Platelets: 206 10*3/uL (ref 150–400)
RBC: 3.83 MIL/uL — ABNORMAL LOW (ref 3.87–5.11)
RDW: 11.9 % (ref 11.5–15.5)
WBC: 5.5 10*3/uL (ref 4.0–10.5)
nRBC: 0 % (ref 0.0–0.2)

## 2021-06-29 LAB — TROPONIN I (HIGH SENSITIVITY): Troponin I (High Sensitivity): 4 ng/L (ref <18)

## 2021-06-29 NOTE — ED Triage Notes (Signed)
Pt presents to ED from home with c/o dizziness that started today, pt says yesterday she took some water and vinegar for leg cramps and "thinks it messed up her stomach, pt had several loose stools today. Denies abd pain at this time. Pt also c/o high BP at home. BP in triage is 155/90

## 2021-07-03 DIAGNOSIS — M79604 Pain in right leg: Secondary | ICD-10-CM | POA: Diagnosis not present

## 2021-07-03 DIAGNOSIS — M545 Low back pain, unspecified: Secondary | ICD-10-CM | POA: Diagnosis not present

## 2021-07-03 DIAGNOSIS — M25561 Pain in right knee: Secondary | ICD-10-CM | POA: Diagnosis not present

## 2021-07-06 ENCOUNTER — Ambulatory Visit (HOSPITAL_COMMUNITY)
Admission: RE | Admit: 2021-07-06 | Discharge: 2021-07-06 | Disposition: A | Discharge status: Home / Self Care | Payer: PPO | Source: Ambulatory Visit | Attending: Cardiology | Admitting: Cardiology

## 2021-07-06 ENCOUNTER — Other Ambulatory Visit: Payer: Self-pay

## 2021-07-06 ENCOUNTER — Other Ambulatory Visit (HOSPITAL_COMMUNITY): Payer: Self-pay | Admitting: Sports Medicine

## 2021-07-06 DIAGNOSIS — M79604 Pain in right leg: Secondary | ICD-10-CM

## 2021-07-11 DIAGNOSIS — M5416 Radiculopathy, lumbar region: Secondary | ICD-10-CM | POA: Diagnosis not present

## 2021-07-12 ENCOUNTER — Ambulatory Visit: Payer: Self-pay

## 2021-07-12 ENCOUNTER — Other Ambulatory Visit: Payer: Self-pay

## 2021-07-12 ENCOUNTER — Encounter: Payer: Self-pay | Admitting: Rheumatology

## 2021-07-12 ENCOUNTER — Ambulatory Visit: Payer: PPO | Admitting: Rheumatology

## 2021-07-12 VITALS — BP 137/81 | HR 87 | Ht 63.0 in | Wt 156.0 lb

## 2021-07-12 DIAGNOSIS — M2550 Pain in unspecified joint: Secondary | ICD-10-CM

## 2021-07-12 DIAGNOSIS — M5136 Other intervertebral disc degeneration, lumbar region: Secondary | ICD-10-CM | POA: Diagnosis not present

## 2021-07-12 DIAGNOSIS — G894 Chronic pain syndrome: Secondary | ICD-10-CM | POA: Diagnosis not present

## 2021-07-12 DIAGNOSIS — G629 Polyneuropathy, unspecified: Secondary | ICD-10-CM

## 2021-07-12 DIAGNOSIS — M3500 Sicca syndrome, unspecified: Secondary | ICD-10-CM

## 2021-07-12 DIAGNOSIS — M51369 Other intervertebral disc degeneration, lumbar region without mention of lumbar back pain or lower extremity pain: Secondary | ICD-10-CM

## 2021-07-12 DIAGNOSIS — M810 Age-related osteoporosis without current pathological fracture: Secondary | ICD-10-CM | POA: Diagnosis not present

## 2021-07-12 DIAGNOSIS — I10 Essential (primary) hypertension: Secondary | ICD-10-CM

## 2021-07-12 DIAGNOSIS — G8929 Other chronic pain: Secondary | ICD-10-CM

## 2021-07-12 DIAGNOSIS — I350 Nonrheumatic aortic (valve) stenosis: Secondary | ICD-10-CM

## 2021-07-12 DIAGNOSIS — I5032 Chronic diastolic (congestive) heart failure: Secondary | ICD-10-CM

## 2021-07-12 DIAGNOSIS — E538 Deficiency of other specified B group vitamins: Secondary | ICD-10-CM

## 2021-07-12 DIAGNOSIS — Z96651 Presence of right artificial knee joint: Secondary | ICD-10-CM

## 2021-07-12 DIAGNOSIS — Z8719 Personal history of other diseases of the digestive system: Secondary | ICD-10-CM

## 2021-07-12 DIAGNOSIS — Z8639 Personal history of other endocrine, nutritional and metabolic disease: Secondary | ICD-10-CM

## 2021-07-12 DIAGNOSIS — M25512 Pain in left shoulder: Secondary | ICD-10-CM

## 2021-07-12 DIAGNOSIS — M25511 Pain in right shoulder: Secondary | ICD-10-CM | POA: Diagnosis not present

## 2021-07-12 DIAGNOSIS — M797 Fibromyalgia: Secondary | ICD-10-CM

## 2021-07-12 DIAGNOSIS — Z872 Personal history of diseases of the skin and subcutaneous tissue: Secondary | ICD-10-CM

## 2021-07-12 MED ORDER — LIDOCAINE HCL 1 % IJ SOLN
1.5000 mL | INTRAMUSCULAR | Status: AC | PRN
  Administered 2021-07-12: 1.5 mL

## 2021-07-12 MED ORDER — TRIAMCINOLONE ACETONIDE 40 MG/ML IJ SUSP
40.0000 mg | INTRAMUSCULAR | Status: AC | PRN
  Administered 2021-07-12: 40 mg via INTRA_ARTICULAR

## 2021-07-12 NOTE — Patient Instructions (Signed)
Shoulder Exercises Ask your health care provider which exercises are safe for you. Do exercises exactly as told by your health care provider and adjust them as directed. It is normal to feel mild stretching, pulling, tightness, or discomfort as you do these exercises. Stop right away if you feel sudden pain or your pain gets worse. Do not begin these exercises until told by your health care provider. Stretching exercises External rotation and abduction This exercise is sometimes called corner stretch. This exercise rotates your arm outward (external rotation) and moves your arm out from your body (abduction). Stand in a doorway with one of your feet slightly in front of the other. This is called a staggered stance. If you cannot reach your forearms to the door frame, stand facing a corner of a room. Choose one of the following positions as told by your health care provider: Place your hands and forearms on the door frame above your head. Place your hands and forearms on the door frame at the height of your head. Place your hands on the door frame at the height of your elbows. Slowly move your weight onto your front foot until you feel a stretch across your chest and in the front of your shoulders. Keep your head and chest upright and keep your abdominal muscles tight. Hold for __________ seconds. To release the stretch, shift your weight to your back foot. Repeat __________ times. Complete this exercise __________ times a day. Extension, standing Stand and hold a broomstick, a cane, or a similar object behind your back. Your hands should be a little wider than shoulder width apart. Your palms should face away from your back. Keeping your elbows straight and your shoulder muscles relaxed, move the stick away from your body until you feel a stretch in your shoulders (extension). Avoid shrugging your shoulders while you move the stick. Keep your shoulder blades tucked down toward the middle of your  back. Hold for __________ seconds. Slowly return to the starting position. Repeat __________ times. Complete this exercise __________ times a day. Range-of-motion exercises Pendulum  Stand near a wall or a surface that you can hold onto for balance. Bend at the waist and let your left / right arm hang straight down. Use your other arm to support you. Keep your back straight and do not lock your knees. Relax your left / right arm and shoulder muscles, and move your hips and your trunk so your left / right arm swings freely. Your arm should swing because of the motion of your body, not because you are using your arm or shoulder muscles. Keep moving your hips and trunk so your arm swings in the following directions, as told by your health care provider: Side to side. Forward and backward. In clockwise and counterclockwise circles. Continue each motion for __________ seconds, or for as long as told by your health care provider. Slowly return to the starting position. Repeat __________ times. Complete this exercise __________ times a day. Shoulder flexion, standing  Stand and hold a broomstick, a cane, or a similar object. Place your hands a little more than shoulder width apart on the object. Your left / right hand should be palm up, and your other hand should be palm down. Keep your elbow straight and your shoulder muscles relaxed. Push the stick up with your healthy arm to raise your left / right arm in front of your body, and then over your head until you feel a stretch in your shoulder (flexion). Avoid   shrugging your shoulder while you raise your arm. Keep your shoulder blade tucked down toward the middle of your back. Hold for __________ seconds. Slowly return to the starting position. Repeat __________ times. Complete this exercise __________ times a day. Shoulder abduction, standing Stand and hold a broomstick, a cane, or a similar object. Place your hands a little more than shoulder  width apart on the object. Your left / right hand should be palm up, and your other hand should be palm down. Keep your elbow straight and your shoulder muscles relaxed. Push the object across your body toward your left / right side. Raise your left / right arm to the side of your body (abduction) until you feel a stretch in your shoulder. Do not raise your arm above shoulder height unless your health care provider tells you to do that. If directed, raise your arm over your head. Avoid shrugging your shoulder while you raise your arm. Keep your shoulder blade tucked down toward the middle of your back. Hold for __________ seconds. Slowly return to the starting position. Repeat __________ times. Complete this exercise __________ times a day. Internal rotation  Place your left / right hand behind your back, palm up. Use your other hand to dangle an exercise band, a towel, or a similar object over your shoulder. Grasp the band with your left / right hand so you are holding on to both ends. Gently pull up on the band until you feel a stretch in the front of your left / right shoulder. The movement of your arm toward the center of your body is called internal rotation. Avoid shrugging your shoulder while you raise your arm. Keep your shoulder blade tucked down toward the middle of your back. Hold for __________ seconds. Release the stretch by letting go of the band and lowering your hands. Repeat __________ times. Complete this exercise __________ times a day. Strengthening exercises External rotation  Sit in a stable chair without armrests. Secure an exercise band to a stable object at elbow height on your left / right side. Place a soft object, such as a folded towel or a small pillow, between your left / right upper arm and your body to move your elbow about 4 inches (10 cm) away from your side. Hold the end of the exercise band so it is tight and there is no slack. Keeping your elbow pressed  against the soft object, slowly move your forearm out, away from your abdomen (external rotation). Keep your body steady so only your forearm moves. Hold for __________ seconds. Slowly return to the starting position. Repeat __________ times. Complete this exercise __________ times a day. Shoulder abduction  Sit in a stable chair without armrests, or stand up. Hold a __________ weight in your left / right hand, or hold an exercise band with both hands. Start with your arms straight down and your left / right palm facing in, toward your body. Slowly lift your left / right hand out to your side (abduction). Do not lift your hand above shoulder height unless your health care provider tells you that this is safe. Keep your arms straight. Avoid shrugging your shoulder while you do this movement. Keep your shoulder blade tucked down toward the middle of your back. Hold for __________ seconds. Slowly lower your arm, and return to the starting position. Repeat __________ times. Complete this exercise __________ times a day. Shoulder extension Sit in a stable chair without armrests, or stand up. Secure an exercise band   to a stable object in front of you so it is at shoulder height. Hold one end of the exercise band in each hand. Your palms should face each other. Straighten your elbows and lift your hands up to shoulder height. Step back, away from the secured end of the exercise band, until the band is tight and there is no slack. Squeeze your shoulder blades together as you pull your hands down to the sides of your thighs (extension). Stop when your hands are straight down by your sides. Do not let your hands go behind your body. Hold for __________ seconds. Slowly return to the starting position. Repeat __________ times. Complete this exercise __________ times a day. Shoulder row Sit in a stable chair without armrests, or stand up. Secure an exercise band to a stable object in front of you so it  is at waist height. Hold one end of the exercise band in each hand. Position your palms so that your thumbs are facing the ceiling (neutral position). Bend each of your elbows to a 90-degree angle (right angle) and keep your upper arms at your sides. Step back until the band is tight and there is no slack. Slowly pull your elbows back behind you. Hold for __________ seconds. Slowly return to the starting position. Repeat __________ times. Complete this exercise __________ times a day. Shoulder press-ups  Sit in a stable chair that has armrests. Sit upright, with your feet flat on the floor. Put your hands on the armrests so your elbows are bent and your fingers are pointing forward. Your hands should be about even with the sides of your body. Push down on the armrests and use your arms to lift yourself off the chair. Straighten your elbows and lift yourself up as much as you comfortably can. Move your shoulder blades down, and avoid letting your shoulders move up toward your ears. Keep your feet on the ground. As you get stronger, your feet should support less of your body weight as you lift yourself up. Hold for __________ seconds. Slowly lower yourself back into the chair. Repeat __________ times. Complete this exercise __________ times a day. Wall push-ups  Stand so you are facing a stable wall. Your feet should be about one arm-length away from the wall. Lean forward and place your palms on the wall at shoulder height. Keep your feet flat on the floor as you bend your elbows and lean forward toward the wall. Hold for __________ seconds. Straighten your elbows to push yourself back to the starting position. Repeat __________ times. Complete this exercise __________ times a day. This information is not intended to replace advice given to you by your health care provider. Make sure you discuss any questions you have with your healthcare provider. Document Revised: 03/27/2019 Document  Reviewed: 01/02/2019 Elsevier Patient Education  2022 [employer]  

## 2021-07-24 DIAGNOSIS — M5136 Other intervertebral disc degeneration, lumbar region: Secondary | ICD-10-CM | POA: Diagnosis not present

## 2021-07-24 DIAGNOSIS — M5416 Radiculopathy, lumbar region: Secondary | ICD-10-CM | POA: Diagnosis not present

## 2021-07-30 NOTE — Progress Notes (Deleted)
Office Visit Note  Patient: Jo Parrish             Date of Birth: [birth year]           MRN: [id number]             PCP: Redmond School, MD Referring: Redmond School, MD Visit Date: 08/09/2021 Occupation: '@GUAROCC'$ @  Subjective:  No chief complaint on file.   History of Present Illness: Jo Parrish is a 87 y.o. female ***   Activities of Daily Living:  Patient reports morning stiffness for *** {minute/hour:19697}.   Patient {ACTIONS;DENIES/REPORTS:21021675::"Denies"} nocturnal pain.  Difficulty dressing/grooming: {ACTIONS;DENIES/REPORTS:21021675::"Denies"} Difficulty climbing stairs: {ACTIONS;DENIES/REPORTS:21021675::"Denies"} Difficulty getting out of chair: {ACTIONS;DENIES/REPORTS:21021675::"Denies"} Difficulty using hands for taps, buttons, cutlery, and/or writing: {ACTIONS;DENIES/REPORTS:21021675::"Denies"}  No Rheumatology ROS completed.   PMFS History:  Patient Active Problem List   Diagnosis Date Noted   Mild aortic stenosis 12/12/2018   UI (urinary incontinence) 01/16/2016   UTI (lower urinary tract infection) 09/13/2015   Hematuria 09/13/2015   Cough 09/13/2015   Wheezing on auscultation 09/13/2015   Vulvar itching 08/15/2015   Vaginal atrophy 08/02/2015   Urinary frequency 07/04/2015   Vaginal burning 07/04/2015   Vaginal dryness 07/04/2015   Rash, legs 06/21/2015   Constipation 01/07/2014   Anemia 10/20/2013   Heme positive stool 10/20/2013   Abnormal LFTs 10/20/2013   Nausea alone 10/20/2013   Cellulitis of leg, right 10/16/2013   Unspecified constipation 10/16/2013   Postoperative anemia due to acute blood loss 10/12/2013   Hyponatremia 10/12/2013   OA (osteoarthritis) of knee 10/09/2013   Heart palpitations 10/07/2013   Hypothyroidism 08/13/2013   Chronic diastolic heart failure ([place]) 08/06/2013   Essential hypertension 08/06/2013   Hyperlipidemia 08/06/2013   Knee pain 03/03/2013    Past Medical History:  Diagnosis Date   Bacterial  overgrowth syndrome    Small bowel   Complication of anesthesia    Constipation    Cough 09/13/2015   GERD (gastroesophageal reflux disease)    Hematuria 09/13/2015   Hyperlipidemia    Hypertension    Hypothyroidism    Left bundle branch block    OSA (obstructive sleep apnea)    UNABLE TO TOLERATE  MASK   PONV (postoperative nausea and vomiting)    Rheumatoid arthritis(714.0)    Sjogren's syndrome ([facility])    UI (urinary incontinence) 01/16/2016   Ulcer, esophagus    Urinary frequency 07/04/2015   UTI (lower urinary tract infection) 09/13/2015   Vaginal atrophy 08/02/2015   Vaginal burning 07/04/2015   Vaginal dryness 07/04/2015   Vulvar itching 08/15/2015   Wheezing on auscultation 09/13/2015    Family History  Problem Relation Age of Onset   Stroke Mother    Heart disease Father    Liver disease Father    COPD Son    Sleep apnea Son    Stroke Maternal Grandmother    Past Surgical History:  Procedure Laterality Date   APPENDECTOMY     COLONOSCOPY  June 2014   Dr. Earlean Shawl, multiple tubular adenomas removed, internal hemorrhoids. Next colonoscopy in 3 years.   DILATION AND CURETTAGE OF UTERUS     ESOPHAGOGASTRODUODENOSCOPY  06/07/2008    Gastritis. No celiac sprue/Small hiatal hernia   ESOPHAGOGASTRODUODENOSCOPY  June 2014   Dr. Earlean Shawl, erosive esophagitis, grade B., large hiatal hernia.   Hydrogen breath test   05/17/2008   small bowel bacterial overgrowth/Hydrogen level rise consistent with small bowel bacterial   TONSILLECTOMY     TOTAL KNEE ARTHROPLASTY Right 10/09/2013  Procedure: RIGHT TOTAL KNEE ARTHROPLASTY;  Surgeon: Gearlean Alf, MD;  Location: [facility];  Service: Orthopedics;  Laterality: Right;   Social History   Social History Narrative   Not on file   Immunization History  Administered Date(s) Administered   Moderna Sars-Covid-2 Vaccination 01/08/2020, 02/03/2020, 10/29/2020     Objective: Vital Signs: There were no vitals taken for this visit.    Physical Exam   Musculoskeletal Exam: ***  CDAI Exam: CDAI Score: -- Patient Global: --; Provider Global: -- Swollen: --; Tender: -- Joint Exam 08/09/2021   No joint exam has been documented for this visit   There is currently no information documented on the homunculus. Go to the Rheumatology activity and complete the homunculus joint exam.  Investigation: No additional findings.  Imaging: XR Shoulder Right  Result Date: 07/12/2021 No glenohumeral joint space narrowing was noted.  Large inferior humeral head spur was noted.  Mild acromioclavicular tearing was noted.  No chondrocalcinosis was noted.  Extra-articular calcification was noted close to the proximal humerus shaft. 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 Other Indications: Patient complains of right calf pain since April 2022. She                    denies any shortness of breath. Risk Factors: None identified. Performing Technologist: Wilkie Aye RVT  Examination Guidelines: A complete evaluation includes B-mode imaging, spectral Doppler, color Doppler, and power Doppler as needed of all accessible portions of each vessel. Bilateral testing is considered an integral part of a complete examination. Limited examinations for reoccurring indications may be performed as noted. The reflux portion of the exam is performed with the patient in reverse Trendelenburg.   +---------+---------------+---------+-----------+----------+--------------+ RIGHT    CompressibilityPhasicitySpontaneityPropertiesThrombus Aging +---------+---------------+---------+-----------+----------+--------------+ CFV      Full           Yes      Yes                                 +---------+---------------+---------+-----------+----------+--------------+ SFJ      Full           Yes      Yes                                 +---------+---------------+---------+-----------+----------+--------------+ FV Prox  Full           Yes      Yes                                 +---------+---------------+---------+-----------+----------+--------------+ FV Mid   Full           Yes      Yes                                 +---------+---------------+---------+-----------+----------+--------------+  FV DistalFull           Yes      Yes                                 +---------+---------------+---------+-----------+----------+--------------+ PFV      Full                                                        +---------+---------------+---------+-----------+----------+--------------+ POP      Full           Yes      Yes                                 +---------+---------------+---------+-----------+----------+--------------+ PTV      Full           Yes      Yes                                 +---------+---------------+---------+-----------+----------+--------------+ PERO     Full           Yes      Yes                                 +---------+---------------+---------+-----------+----------+--------------+ Gastroc  Full                                                        +---------+---------------+---------+-----------+----------+--------------+ GSV      Full           Yes      Yes                                 +---------+---------------+---------+-----------+----------+--------------+    +----+---------------+---------+-----------+----------+--------------+ LEFTCompressibilityPhasicitySpontaneityPropertiesThrombus Aging +----+---------------+---------+-----------+----------+--------------+ CFV Full           Yes      Yes                                 +----+---------------+---------+-----------+----------+--------------+    Summary: RIGHT: - No evidence of deep vein thrombosis in the lower extremity. No indirect evidence of obstruction proximal to the inguinal ligament. - No cystic structure found in the popliteal fossa.  LEFT: - No evidence of common femoral vein obstruction.  *See table(s) above for measurements and observations. Electronically signed by Larae Grooms MD on 07/06/2021 at 10:19:35 PM.    Final     Recent Labs: Lab Results  Component Value Date   WBC 5.5 06/29/2021   HGB 12.8 06/29/2021   PLT 206 06/29/2021   NA 132 (L) 06/29/2021   K 4.4 06/29/2021   CL 100 06/29/2021   CO2 26 06/29/2021   GLUCOSE 118 (H) 06/29/2021   BUN 11 06/29/2021   CREATININE 0.71 06/29/2021   BILITOT 0.6 06/29/2021   ALKPHOS  40 06/29/2021   AST 22 06/29/2021   ALT 20 06/29/2021   PROT 7.0 06/29/2021   ALBUMIN 4.2 06/29/2021   CALCIUM 8.8 (L) 06/29/2021   GFRAA 58 (L) 06/11/2018    Speciality Comments: [facility] 2017-2022  Procedures:  No procedures performed Allergies: Amoxicillin, Gabapentin, Levaquin [levofloxacin in d5w], Prednisone, Statins, Sucralfate, Sulfa antibiotics, Tramadol, Fenofibrate micronized, and Tizanidine   Assessment / Plan:     Visit Diagnoses: No diagnosis found.  Orders: No orders of the defined types were placed in this encounter.  No orders of the defined types were placed in this encounter.   Face-to-face time spent with patient was *** minutes. Greater than 50% of time was spent in counseling and coordination of care.  Follow-Up Instructions: No follow-ups on file.   Bo Merino, MD  Note - This record has been  created using Editor, commissioning.  Chart creation errors have been sought, but may not always  have been located. Such creation errors do not reflect on  the standard of medical care.

## 2021-08-01 ENCOUNTER — Ambulatory Visit: Payer: PPO | Admitting: Neurology

## 2021-08-01 ENCOUNTER — Encounter: Payer: Self-pay | Admitting: Neurology

## 2021-08-01 ENCOUNTER — Other Ambulatory Visit: Payer: Self-pay

## 2021-08-01 ENCOUNTER — Other Ambulatory Visit: Payer: Self-pay | Admitting: *Deleted

## 2021-08-01 ENCOUNTER — Other Ambulatory Visit: Payer: Self-pay | Admitting: Neurology

## 2021-08-01 DIAGNOSIS — G6289 Other specified polyneuropathies: Secondary | ICD-10-CM | POA: Diagnosis not present

## 2021-08-01 DIAGNOSIS — R252 Cramp and spasm: Secondary | ICD-10-CM | POA: Diagnosis not present

## 2021-08-01 MED ORDER — PREGABALIN 50 MG PO CAPS: 50.0000 mg | ORAL_CAPSULE | Freq: Every day | ORAL | 0 refills | Status: DC

## 2021-08-01 MED ORDER — DICLOFENAC SODIUM 50 MG PO TBEC: 50.0000 mg | DELAYED_RELEASE_TABLET | Freq: Every day | ORAL | 0 refills | Status: AC

## 2021-08-01 NOTE — Progress Notes (Signed)
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 Patient reports that she has been having severe pain for the past 6 months.  She described the pain as "burning pain that you cannot put out" and is worse at night.  On top of the burning pain sometimes she will get cramping in the lower extremities that she describes as "someone stabbing you" more so on the right leg.  She reported she tried gabapentin for the neuropathy but it gave her severe mouth sores therefore she discontinued it.  She is stating that now burning pain involved both hands also.  She had she has a history of osteoarthritis and reported receiving steroid injections in the back and shoulders that are helpful. Last week, she had a injection in right shoulder which helped her a lot. Patient stated she was very active, she had a 2 acres lawn that she moans weekly.  She reports that she has a ride on lawnmower that she used weekly, described the activity very helpful and therapeutic for her, but last week she had to hire people to moan the yard for her. She also clean her house daily. She states this week, she has not worked on her yard and she had a good night of sleep in the past 6 months because "she didn't do nothing all day"   OTHER MEDICAL CONDITIONS: Reported autoimmune disease, dry  mouth, osteoarthritis, hypothyroidism   REVIEW OF SYSTEMS: Full 14 system review of systems performed and negative with exception of: as noted in the HPI  ALLERGIES: Allergies  Allergen Reactions   Amoxicillin     Mouth sores   Gabapentin    Levaquin [Levofloxacin In D5w]     Mouth sores   Prednisone     Other reaction(s): Other (See Comments)   Statins Other (See Comments)    Leg problems   Sucralfate     Other reaction(s): Other (See Comments) Sore mouth   Sulfa Antibiotics Other (See Comments)    Reaction unknown   Tramadol Other (See Comments)    Pt prefers to not be prescribed this medication again. It previously caused her to become confused, fatigued and have hair loss.   Fenofibrate Micronized Other (See Comments)    unknown   Tizanidine Other (See Comments)    Dry mouth, slurred speech    HOME MEDICATIONS: Outpatient Medications Prior to Visit  Medication Sig Dispense Refill   amLODipine (NORVASC) 5 MG tablet Take 1 tablet (5 mg total) by mouth 2 (two) times daily. 180 tablet 1   aspirin 81 MG tablet Take 81 mg every other day by mouth.     atenolol (TENORMIN) 25 MG tablet Take 25 mg by mouth daily.      BIOTIN PO Take by mouth daily.     irbesartan (AVAPRO) 300 MG tablet Take 300 mg by mouth every morning.  levothyroxine (SYNTHROID) 88 MCG tablet Take 88 mcg by mouth daily.     Magnesium 250 MG TABS Take by mouth daily.     Multiple Vitamins-Minerals (ZINC PO) Take 50 mg by mouth daily.     omeprazole (PRILOSEC) 40 MG capsule Take 40 mg by mouth every morning.      polyethylene glycol powder (GLYCOLAX/MIRALAX) 17 GM/SCOOP powder Take 17 g by mouth daily.     No facility-administered medications prior to visit.    PAST MEDICAL HISTORY: Past Medical History:  Diagnosis Date   Bacterial overgrowth syndrome    Small bowel   Complication of anesthesia    Constipation    Cough 09/13/2015   GERD (gastroesophageal reflux disease)    Hematuria 09/13/2015    Hyperlipidemia    Hypertension    Hypothyroidism    Left bundle branch block    Neuropathy    OSA (obstructive sleep apnea)    UNABLE TO TOLERATE  MASK   PONV (postoperative nausea and vomiting)    Rheumatoid arthritis(714.0)    Sjogren's syndrome (HCC)    UI (urinary incontinence) 01/16/2016   Ulcer, esophagus    Urinary frequency 07/04/2015   UTI (lower urinary tract infection) 09/13/2015   Vaginal atrophy 08/02/2015   Vaginal burning 07/04/2015   Vaginal dryness 07/04/2015   Vulvar itching 08/15/2015   Wheezing on auscultation 09/13/2015    PAST SURGICAL HISTORY: Past Surgical History:  Procedure Laterality Date   APPENDECTOMY     COLONOSCOPY  June 2014   Dr. Earlean Shawl, multiple tubular adenomas removed, internal hemorrhoids. Next colonoscopy in 3 years.   DILATION AND CURETTAGE OF UTERUS     ESOPHAGOGASTRODUODENOSCOPY  06/07/2008    Gastritis. No celiac sprue/Small hiatal hernia   ESOPHAGOGASTRODUODENOSCOPY  June 2014   Dr. Earlean Shawl, erosive esophagitis, grade B., large hiatal hernia.   Hydrogen breath test   05/17/2008   small bowel bacterial overgrowth/Hydrogen level rise consistent with small bowel bacterial   TONSILLECTOMY     TOTAL KNEE ARTHROPLASTY Right 10/09/2013   Procedure: RIGHT TOTAL KNEE ARTHROPLASTY;  Surgeon: Gearlean Alf, MD;  Location: [facility];  Service: Orthopedics;  Laterality: Right;    FAMILY HISTORY: Family History  Problem Relation Age of Onset   Stroke Mother    Heart disease Father    Liver disease Father    COPD Son    Sleep apnea Son    Stroke Maternal Grandmother     SOCIAL HISTORY: Social History   Socioeconomic History   Marital status: Widowed    Spouse name: Not on file   Number of children: Not on file   Years of education: Not on file   Highest education level: Not on file  Occupational History   Occupation: [employer]  Tobacco Use   Smoking status: Never   Smokeless tobacco: Never  Vaping Use   Vaping Use: Never  used  Substance and Sexual Activity   Alcohol use: No   Drug use: No   Sexual activity: Not Currently    Birth control/protection: Post-menopausal  Other Topics Concern   Not on file  Social History Narrative   Lives alone   Right Handed   Drinks 1-2 cups caffeine daily   Social Determinants of Health   Financial Resource Strain: Not on file  Food Insecurity: Not on file  Transportation Needs: Not on file  Physical Activity: Not on file  Stress: Not on file  Social Connections: Not on file  Intimate Partner Violence: Not on file  PHYSICAL EXAM  GENERAL EXAM/CONSTITUTIONAL: Vitals: There were no vitals filed for this visit. There is no height or weight on file to calculate BMI. Wt Readings from Last 3 Encounters:  07/12/21 156 lb (70.8 kg)  06/29/21 150 lb (68 kg)  05/31/21 150 lb (68 kg)   Patient is in no distress; well developed, nourished and groomed; neck is supple  CARDIOVASCULAR: Examination of carotid arteries is normal; no carotid bruits Regular rate and rhythm, no murmurs Examination of peripheral vascular system by observation and palpation is normal  EYES: Pupils round and reactive to light, Visual fields full to confrontation, Extraocular movements intacts,   MUSCULOSKELETAL: Gait, strength, tone, movements noted in Neurologic exam below  NEUROLOGIC: MENTAL STATUS: awake, alert, oriented to person, place and time recent and remote memory intact normal attention and concentration language fluent, comprehension intact, naming intact fund of knowledge appropriate  CRANIAL NERVE:  2nd, 3rd, 4th, 6th - pupils equal and reactive to light, visual fields full to confrontation, extraocular muscles intact, no nystagmus 5th - facial sensation symmetric 7th - facial strength symmetric 8th - hearing intact 9th - palate elevates symmetrically, uvula midline 11th - shoulder shrug symmetric 12th - tongue protrusion midline  MOTOR:  normal bulk and  tone, full strength in the BUE, BLE  SENSORY:  normal and symmetric to light touch, pinprick, vibration in the bilateral upper extremities. There is decrease sensation to pinprick and vibration to the lower extremities up to knees bilaterally right greater then left.   COORDINATION:  finger-nose-finger, fine finger movements normal  REFLEXES:  deep tendon reflexes present and symmetric  GAIT/STATION:  Ambulates with assistance    DIAGNOSTIC DATA (LABS, IMAGING, TESTING) - I reviewed patient records, labs, notes, testing and imaging myself where available.  Lab Results  Component Value Date   WBC 5.5 06/29/2021   HGB 12.8 06/29/2021   HCT 37.4 06/29/2021   MCV 97.7 06/29/2021   PLT 206 06/29/2021      Component Value Date/Time   NA 132 (L) 06/29/2021 2237   K 4.4 06/29/2021 2237   CL 100 06/29/2021 2237   CO2 26 06/29/2021 2237   GLUCOSE 118 (H) 06/29/2021 2237   BUN 11 06/29/2021 2237   BUN 13 10/21/2013 0000   CREATININE 0.71 06/29/2021 2237   CREATININE 0.96 (H) 06/07/2016 1338   CALCIUM 8.8 (L) 06/29/2021 2237   PROT 7.0 06/29/2021 2237   ALBUMIN 4.2 06/29/2021 2237   ALBUMIN 3.0 10/21/2013 0000   AST 22 06/29/2021 2237   AST 21 10/21/2013 0000   ALT 20 06/29/2021 2237   ALKPHOS 40 06/29/2021 2237   ALKPHOS 63 10/21/2013 0000   BILITOT 0.6 06/29/2021 2237   BILITOT 0.9 10/21/2013 0000   GFRNONAA >60 06/29/2021 2237   GFRAA 58 (L) 06/11/2018 1527   Lab Results  Component Value Date   CHOL 193 06/21/2015   HDL 51 06/21/2015   LDLCALC 115 (H) 06/21/2015   TRIG 137 06/21/2015   CHOLHDL 3.8 06/21/2015   No results found for: HGBA1C No results found for: VITAMINB12 No results found for: TSH     ASSESSMENT AND PLAN  87 y.o. year old female with medical history of reported autoimmune disease, osteoarthritis, polyarthritis, hypothyroidism and hypertension who is presenting for bilateral lower extremity neuropathy.  Patient stated for the past 6 months she  has been having burning pain worse on the right and worse at night. Burning sensation and numbness also involving both hands.  She has tried gabapentin in  the past but she discontinued due to mouth sores.  On exam she does have decreased sensation to light touch, vibration, and pinprick involving both lower extremities and bilateral hands in a stocking glove distribution right greater than left.  I recommended patient to give a trial of pregabalin 50 mg nightly starting 25 mg nightly then increase to 50 mg nightly.  If the pregabalin does not improve her symptoms then we can try lidocaine cream.  I also prescribed her diclofenac for her joint pain.  Patient is to follow-up in 3 months   1. Other polyneuropathy   2. Cramp in lower leg     PLAN: Trial of Pregabalin for neuropathic pain   Take 1/2 pill nightly for one week   Then increase to 1 tab nightly  You can try Diclofenac as needed for the pain You can try Lidocaine cream in both legs if Pregabalin not helpful.  Return to clinic in 3 months.   No orders of the defined types were placed in this encounter.   Meds ordered this encounter  Medications   diclofenac (VOLTAREN) 50 MG EC tablet    Sig: Take 1 tablet (50 mg total) by mouth daily.    Dispense:  30 tablet    Refill:  0   pregabalin (LYRICA) 50 MG capsule    Sig: Take 1 capsule (50 mg total) by mouth daily.    Dispense:  30 capsule    Refill:  0    Return in about 3 months (around 11/01/2021).    Alric Ran, MD 08/01/2021, 10:26 AM  [facility] [address], [place], [place] [address] [phone]

## 2021-08-01 NOTE — Telephone Encounter (Signed)
Pt left vm re: the controlled substance that Dr April Manson is calling in for her, it needs to be called into [facility] on [address] in [place] ph [phone], pt states please do not send to [place] (pt does not recall the name of the medication)

## 2021-08-01 NOTE — Patient Instructions (Addendum)
Trial of Pregabalin for neuropathic pain   Take 1/2 pill nightly for one week   Then increase to 1 tab nightly  You can try Diclofenac as needed for the pain You can try Lidocaine cream in both legs if Pregabalin not helpful.  Return to clinic in 3 months.

## 2021-08-09 ENCOUNTER — Ambulatory Visit: Payer: PPO | Admitting: Rheumatology

## 2021-08-09 DIAGNOSIS — M5136 Other intervertebral disc degeneration, lumbar region: Secondary | ICD-10-CM

## 2021-08-09 DIAGNOSIS — M3500 Sicca syndrome, unspecified: Secondary | ICD-10-CM

## 2021-08-09 DIAGNOSIS — M19011 Primary osteoarthritis, right shoulder: Secondary | ICD-10-CM

## 2021-08-09 DIAGNOSIS — G629 Polyneuropathy, unspecified: Secondary | ICD-10-CM

## 2021-08-09 DIAGNOSIS — G894 Chronic pain syndrome: Secondary | ICD-10-CM

## 2021-08-09 DIAGNOSIS — M797 Fibromyalgia: Secondary | ICD-10-CM

## 2021-08-09 DIAGNOSIS — I5032 Chronic diastolic (congestive) heart failure: Secondary | ICD-10-CM

## 2021-08-09 DIAGNOSIS — E538 Deficiency of other specified B group vitamins: Secondary | ICD-10-CM

## 2021-08-09 DIAGNOSIS — Z8719 Personal history of other diseases of the digestive system: Secondary | ICD-10-CM

## 2021-08-09 DIAGNOSIS — M810 Age-related osteoporosis without current pathological fracture: Secondary | ICD-10-CM

## 2021-08-09 DIAGNOSIS — Z96651 Presence of right artificial knee joint: Secondary | ICD-10-CM

## 2021-08-09 DIAGNOSIS — Z8639 Personal history of other endocrine, nutritional and metabolic disease: Secondary | ICD-10-CM

## 2021-08-09 DIAGNOSIS — I350 Nonrheumatic aortic (valve) stenosis: Secondary | ICD-10-CM

## 2021-08-09 DIAGNOSIS — I10 Essential (primary) hypertension: Secondary | ICD-10-CM

## 2021-08-17 ENCOUNTER — Other Ambulatory Visit: Payer: Self-pay | Admitting: Neurology

## 2021-08-17 MED ORDER — PREGABALIN 50 MG PO CAPS: 50.0000 mg | ORAL_CAPSULE | Freq: Two times a day (BID) | ORAL | 6 refills | Status: DC

## 2021-08-17 NOTE — Addendum Note (Signed)
Addended byAlric Ran on: 08/17/2021 12:07 PM   Modules accepted: Orders

## 2021-08-17 NOTE — Telephone Encounter (Signed)
Pt called wanting to know if provider can give her a 90 day supply of the pregabalin (LYRICA) 50 MG capsule instead of 45 she said her insurance informed her that it would be the same price. Pt requesting a call back.

## 2021-08-17 NOTE — Telephone Encounter (Signed)
Pt called stating the diclofenac (VOLTAREN) 50 MG EC tablet works wonderful, however she is wanting to know can she increase the dosage she says it only last about 4 hours. Pt is requesting a call back.

## 2021-08-17 NOTE — Telephone Encounter (Addendum)
I called the patient. Confirmed taking pregabalin '50mg'$ , one capsule at bedtime. She took her first tablet of diclofenac '50mg'$  on 08/16/21. Getting about four hours of relief. She has continued to have significant, burning pain, outside of that time, that is "wearing her down mentally and physically". Symptoms are causing her gait difficulty. She purchased OTC lidocaine 4% cream that does not help.  Also, informed me she gets routine epidural steroid injections from Dr. Nelva Bush ([facility]). She has a pending follow up office visit with him soon. Says she is not eligible for a repeat ESI until October.  She is asking if any further changes can be made to her treatment regimen. She is aware to expect a call back from our office.

## 2021-08-17 NOTE — Telephone Encounter (Signed)
Spoke with patient, states that she took Pregabalin 50 mg and it provided her great relief, she had 4 hours pain free period. Calling to update me and ask for refill. I informed patient that she can take it twice a day. Will send a prescription. I will see her in November for her follow up.   Alric Ran, MD

## 2021-08-22 MED ORDER — PREGABALIN 50 MG PO CAPS: 50.0000 mg | ORAL_CAPSULE | Freq: Two times a day (BID) | ORAL | 1 refills | Status: DC

## 2021-08-22 NOTE — Addendum Note (Signed)
Addended by: Desmond Lope on: 08/22/2021 06:57 AM   Modules accepted: Orders

## 2021-08-28 NOTE — Progress Notes (Signed)
Office Visit Note  Patient: Jo Parrish             Date of Birth: 15-Apr-1926           MRN: [id number]             PCP: Redmond School, MD Referring: Redmond School, MD Visit Date: 09/05/2021 Occupation: '@GUAROCC'$ @  Subjective:  Other (Patient requested a left knee x-ray. Patient reports pain in right leg)   History of Present Illness: Jo Parrish is a 87 y.o. female with history of sicca symptoms and osteoarthritis.  She states she has been having severe pain and discomfort in her left knee joint since last visit.  She states pain suddenly started and has been lingering on.  She denies any joint swelling.  She continues to have some stiffness and discomfort in her hands.  Her right shoulder joint continues to bother her.  She continues to have lower back pain and neuropathy.  She has not noticed any improvement with pregabalin.  Activities of Daily Living:  Patient reports morning stiffness for several hours.   Patient Reports nocturnal pain.  Difficulty dressing/grooming: Reports Difficulty climbing stairs: Reports Difficulty getting out of chair: Reports Difficulty using hands for taps, buttons, cutlery, and/or writing: Denies  Review of Systems  Constitutional:  Negative for fatigue.  HENT:  Positive for mouth sores, mouth dryness and nose dryness.   Eyes:  Positive for dryness. Negative for pain and itching.  Respiratory:  Positive for shortness of breath. Negative for difficulty breathing.   Cardiovascular:  Negative for chest pain and palpitations.  Gastrointestinal:  Negative for blood in stool, constipation and diarrhea.  Endocrine: Positive for increased urination.  Genitourinary:  Negative for difficulty urinating and painful urination.  Musculoskeletal:  Positive for joint pain, joint pain, myalgias, morning stiffness, muscle tenderness and myalgias. Negative for joint swelling.  Skin:  Negative for color change, rash and redness.  Allergic/Immunologic: Negative  for susceptible to infections.  Neurological:  Positive for numbness. Negative for dizziness, headaches and memory loss.  Hematological:  Positive for bruising/bleeding tendency.  Psychiatric/Behavioral:  Negative for confusion.    PMFS History:  Patient Active Problem List   Diagnosis Date Noted   Mild aortic stenosis 12/12/2018   UI (urinary incontinence) 01/16/2016   UTI (lower urinary tract infection) 09/13/2015   Hematuria 09/13/2015   Cough 09/13/2015   Wheezing on auscultation 09/13/2015   Vulvar itching 08/15/2015   Vaginal atrophy 08/02/2015   Urinary frequency 07/04/2015   Vaginal burning 07/04/2015   Vaginal dryness 07/04/2015   Rash, legs 06/21/2015   Constipation 01/07/2014   Anemia 10/20/2013   Heme positive stool 10/20/2013   Abnormal LFTs 10/20/2013   Nausea alone 10/20/2013   Cellulitis of leg, right 10/16/2013   Unspecified constipation 10/16/2013   Postoperative anemia due to acute blood loss 10/12/2013   Hyponatremia 10/12/2013   OA (osteoarthritis) of knee 10/09/2013   Heart palpitations 10/07/2013   Hypothyroidism 08/13/2013   Chronic diastolic heart failure ([place]) 08/06/2013   Essential hypertension 08/06/2013   Hyperlipidemia 08/06/2013   Knee pain 03/03/2013    Past Medical History:  Diagnosis Date   Bacterial overgrowth syndrome    Small bowel   Complication of anesthesia    Constipation    Cough 09/13/2015   GERD (gastroesophageal reflux disease)    Hematuria 09/13/2015   Hyperlipidemia    Hypertension    Hypothyroidism    Left bundle branch block    Neuropathy  OSA (obstructive sleep apnea)    UNABLE TO TOLERATE  MASK   PONV (postoperative nausea and vomiting)    Rheumatoid arthritis(714.0)    Sjogren's syndrome (HCC)    UI (urinary incontinence) 01/16/2016   Ulcer, esophagus    Urinary frequency 07/04/2015   UTI (lower urinary tract infection) 09/13/2015   Vaginal atrophy 08/02/2015   Vaginal burning 07/04/2015   Vaginal  dryness 07/04/2015   Vulvar itching 08/15/2015   Wheezing on auscultation 09/13/2015    Family History  Problem Relation Age of Onset   Stroke Mother    Heart disease Father    Liver disease Father    COPD Son    Sleep apnea Son    Stroke Maternal Grandmother    Past Surgical History:  Procedure Laterality Date   APPENDECTOMY     COLONOSCOPY  June 2014   Dr. Earlean Shawl, multiple tubular adenomas removed, internal hemorrhoids. Next colonoscopy in 3 years.   DILATION AND CURETTAGE OF UTERUS     ESOPHAGOGASTRODUODENOSCOPY  06/07/2008    Gastritis. No celiac sprue/Small hiatal hernia   ESOPHAGOGASTRODUODENOSCOPY  June 2014   Dr. Earlean Shawl, erosive esophagitis, grade B., large hiatal hernia.   Hydrogen breath test   05/17/2008   small bowel bacterial overgrowth/Hydrogen level rise consistent with small bowel bacterial   TONSILLECTOMY     TOTAL KNEE ARTHROPLASTY Right 10/09/2013   Procedure: RIGHT TOTAL KNEE ARTHROPLASTY;  Surgeon: Gearlean Alf, MD;  Location: [facility];  Service: Orthopedics;  Laterality: Right;   Social History   Social History Narrative   Lives alone   Right Handed   Drinks 1-2 cups caffeine daily   Immunization History  Administered Date(s) Administered   Moderna Sars-Covid-2 Vaccination 01/08/2020, 02/03/2020, 10/29/2020     Objective: Vital Signs: BP (!) 151/83 (BP Location: Left Arm, Patient Position: Sitting, Cuff Size: Normal)   Pulse 65   Ht '5\' 2"'$  (1.575 m)   Wt 154 lb 6.4 oz (70 kg)   BMI 28.24 kg/m    Physical Exam Vitals and nursing note reviewed.  Constitutional:      Appearance: She is well-developed.  HENT:     Head: Normocephalic and atraumatic.  Eyes:     Conjunctiva/sclera: Conjunctivae normal.  Cardiovascular:     Rate and Rhythm: Normal rate and regular rhythm.     Heart sounds: Normal heart sounds.  Pulmonary:     Effort: Pulmonary effort is normal.     Breath sounds: Normal breath sounds.  Abdominal:     General: Bowel sounds  are normal.     Palpations: Abdomen is soft.  Musculoskeletal:     Cervical back: Normal range of motion.  Lymphadenopathy:     Cervical: No cervical adenopathy.  Skin:    General: Skin is warm and dry.     Capillary Refill: Capillary refill takes less than 2 seconds.  Neurological:     Mental Status: She is alert and oriented to person, place, and time.  Psychiatric:        Behavior: Behavior normal.     Musculoskeletal Exam: She had limited range of motion of her cervical spine.  She had discomfort range of motion of shoulder joints.  Elbow joints with good range of motion.  She had bilateral PIP and DIP thickening in her hands with no synovitis.  Hip joints with good range of motion.  Right knee joint is replaced.  She had discomfort on flexion of her left knee joint without any warmth swelling or  effusion.  There was no tenderness over ankles or MTPs.  CDAI Exam: CDAI Score: -- Patient Global: --; Provider Global: -- Swollen: --; Tender: -- Joint Exam 09/05/2021   No joint exam has been documented for this visit   There is currently no information documented on the homunculus. Go to the Rheumatology activity and complete the homunculus joint exam.  Investigation: No additional findings.  Imaging: XR KNEE 3 VIEW LEFT  Result Date: 09/05/2021 Moderate lateral compartment narrowing with lateral osteophytes was noted.  Moderate patellofemoral narrowing was noted.  Moderate patellofemoral narrowing was noted.  No chondrocalcinosis was noted. Impression: These findings are consistent with moderate osteoarthritis and moderate chondromalacia patella.   Recent Labs: Lab Results  Component Value Date   WBC 5.5 06/29/2021   HGB 12.8 06/29/2021   PLT 206 06/29/2021   NA 132 (L) 06/29/2021   K 4.4 06/29/2021   CL 100 06/29/2021   CO2 26 06/29/2021   GLUCOSE 118 (H) 06/29/2021   BUN 11 06/29/2021   CREATININE 0.71 06/29/2021   BILITOT 0.6 06/29/2021   ALKPHOS 40 06/29/2021    AST 22 06/29/2021   ALT 20 06/29/2021   PROT 7.0 06/29/2021   ALBUMIN 4.2 06/29/2021   CALCIUM 8.8 (L) 06/29/2021   GFRAA 58 (L) 06/11/2018    Speciality Comments: Reclast 2017-2022  Procedures:  Large Joint Inj on 09/05/2021 11:34 AM Indications: pain Details: 27 G 1.5 in needle, medial approach  Arthrogram: No  Medications: 40 mg triamcinolone acetonide 40 MG/ML; 1.5 mL lidocaine 1 % Aspirate: 0 mL Outcome: tolerated well, no immediate complications Procedure, treatment alternatives, risks and benefits explained, specific risks discussed. Consent was given by the patient. Immediately prior to procedure a time out was called to verify the correct patient, procedure, equipment, support staff and site/side marked as required. Patient was prepped and draped in the usual sterile fashion.    Allergies: Amoxicillin, Gabapentin, Levaquin [levofloxacin in d5w], Prednisone, Statins, Sucralfate, Sulfa antibiotics, Tramadol, Fenofibrate micronized, and Tizanidine   Assessment / Plan:     Visit Diagnoses: Sicca complex ([place]) - History of dry mouth and dry eyes for many years.  She was diagnosed with Sjogren's at [facility].  Patient declined autoimmune work-up.  She continues to have dry eye symptoms.  Over-the-counter products were discussed.  Primary osteoarthritis of both shoulders - X-ray showed osteoarthritic changes in the right shoulder joint.  Right shoulder was injected on July 12, 2021.  A handout on exercises was given.  She states the cortisone injection was helpful but some of the pain is returning back.  I emphasized doing exercises on a regular basis.  Status post total knee replacement, right - By Dr. Maureen Ralphs in 2014.  Patient gives history of pain in bilateral knee joints.  Chronic pain of left knee -she has been experiencing pain and discomfort in her left knee joint for the last 6 months.  She has difficulty bending her knee.  No warmth swelling or effusion was  noted.  Plan: XR KNEE 3 VIEW LEFT.  X-ray showed moderate lateral compartment narrowing and moderate patellofemoral narrowing.  No chondrocalcinosis was noted.  X-ray findings were discussed with the patient.  Per patient's request left knee joint was injected with cortisone as described below.  Indications side effects contraindications of cortisone was discussed.  Postprocedure instructions were given.  She was advised to monitor blood pressure closely after the injection.  Patient declined physical therapy.  A handout on knee joint exercises was given.  DDD (degenerative disc disease), lumbar - She gets cortisone injections at [facility].  She is also followed by Dr. Nelva Bush.  Neuropathy - Patient was seen by a neurologist in August.  She was started on pregabalin for neuropathy.  She has not worked since much improvement on pregabalin.  Age-related osteoporosis without current pathological fracture -   She was treated with Reclast treated from 2015 through 2022.  She is currently on drug holiday.  Calcium rich diet and vitamin D was discussed.  Fibromyalgia - She has history of fibromyalgia.  She generalized pain hyperalgesia and positive tender points.  Chronic pain syndrome  Essential hypertension  Chronic diastolic heart failure (HCC)  Mild aortic stenosis  History of hyperlipidemia  History of IBS  History of hypothyroidism  Vitamin B12 deficiency  Orders: Orders Placed This Encounter  Procedures   XR KNEE 3 VIEW LEFT   No orders of the defined types were placed in this encounter.    Follow-Up Instructions: Return in about 6 months (around 03/05/2022) for Sicca, OA.   Bo Merino, MD  Note - This record has been created using Editor, commissioning.  Chart creation errors have been sought, but may not always  have been located. Such creation errors do not reflect on  the standard of medical care.

## 2021-08-29 ENCOUNTER — Other Ambulatory Visit: Payer: Self-pay | Admitting: Neurology

## 2021-08-29 MED ORDER — PREGABALIN 25 MG PO CAPS: 25.0000 mg | ORAL_CAPSULE | ORAL | 0 refills | Status: DC

## 2021-08-29 NOTE — Telephone Encounter (Signed)
Patient came into the lobby she is having pain in her right leg still and not the pain has come into her left leg. She wanted to get in asap to see Dr. April Manson. She also advised she is losing her vision and is unable to sleep from the pain. She is taking the medication one a day not 2 as he prescribed because she feels like a zombie and dizzy. She doesn't know the medication name and will let you know when you speak to her. If you call her and she doesn't answer please leave a message. Thank you

## 2021-08-29 NOTE — Addendum Note (Signed)
Addended by: Noberto Retort C on: 08/29/2021 12:02 PM   Modules accepted: Orders

## 2021-08-29 NOTE — Telephone Encounter (Signed)
Per vo by Dr. April Manson, she may try pregabalin '25mg'$  in am and '50mg'$  in pm. Hopefully, she will experience greater tolerability during the day and have reduced pain. She is agreeable to this plan. She just picked up #180 capsules of pregabalin '50mg'$ . She will continue taking one at bedtime. She would like a 90-day supply of pregabalin '25mg'$ , one cap in am sent to the pharmacy. She will keep her follow up on 11/02/21.

## 2021-08-29 NOTE — Telephone Encounter (Addendum)
I spoke to the patient. Reports bilateral lower extremity pain during the day. She has a prescription for pregabalin '50mg'$ , one cap BID. She is unable to take it during the day. Feels off balance and dizzy. The bedtime dose works well and allows her to sleep throughout the night. She would like an alternate plan for the daytime discomfort.   Additionally, she is having issues with decreased vision. Says her optometrist is referring her to an ophthalmologist for further evaluation.

## 2021-08-30 DIAGNOSIS — D510 Vitamin B12 deficiency anemia due to intrinsic factor deficiency: Secondary | ICD-10-CM | POA: Diagnosis not present

## 2021-08-30 DIAGNOSIS — E538 Deficiency of other specified B group vitamins: Secondary | ICD-10-CM | POA: Diagnosis not present

## 2021-09-05 ENCOUNTER — Encounter: Payer: Self-pay | Admitting: Rheumatology

## 2021-09-05 ENCOUNTER — Other Ambulatory Visit: Payer: Self-pay

## 2021-09-05 ENCOUNTER — Ambulatory Visit: Payer: PPO | Admitting: Rheumatology

## 2021-09-05 ENCOUNTER — Ambulatory Visit: Payer: Self-pay

## 2021-09-05 VITALS — BP 151/83 | HR 65 | Ht 62.0 in | Wt 154.4 lb

## 2021-09-05 DIAGNOSIS — I350 Nonrheumatic aortic (valve) stenosis: Secondary | ICD-10-CM

## 2021-09-05 DIAGNOSIS — M25562 Pain in left knee: Secondary | ICD-10-CM | POA: Diagnosis not present

## 2021-09-05 DIAGNOSIS — M3500 Sicca syndrome, unspecified: Secondary | ICD-10-CM | POA: Diagnosis not present

## 2021-09-05 DIAGNOSIS — M810 Age-related osteoporosis without current pathological fracture: Secondary | ICD-10-CM

## 2021-09-05 DIAGNOSIS — M19011 Primary osteoarthritis, right shoulder: Secondary | ICD-10-CM

## 2021-09-05 DIAGNOSIS — Z96651 Presence of right artificial knee joint: Secondary | ICD-10-CM | POA: Diagnosis not present

## 2021-09-05 DIAGNOSIS — G894 Chronic pain syndrome: Secondary | ICD-10-CM | POA: Diagnosis not present

## 2021-09-05 DIAGNOSIS — E538 Deficiency of other specified B group vitamins: Secondary | ICD-10-CM

## 2021-09-05 DIAGNOSIS — M19012 Primary osteoarthritis, left shoulder: Secondary | ICD-10-CM

## 2021-09-05 DIAGNOSIS — G629 Polyneuropathy, unspecified: Secondary | ICD-10-CM | POA: Diagnosis not present

## 2021-09-05 DIAGNOSIS — M5136 Other intervertebral disc degeneration, lumbar region: Secondary | ICD-10-CM | POA: Diagnosis not present

## 2021-09-05 DIAGNOSIS — Z8719 Personal history of other diseases of the digestive system: Secondary | ICD-10-CM

## 2021-09-05 DIAGNOSIS — Z8639 Personal history of other endocrine, nutritional and metabolic disease: Secondary | ICD-10-CM

## 2021-09-05 DIAGNOSIS — M51369 Other intervertebral disc degeneration, lumbar region without mention of lumbar back pain or lower extremity pain: Secondary | ICD-10-CM

## 2021-09-05 DIAGNOSIS — I5032 Chronic diastolic (congestive) heart failure: Secondary | ICD-10-CM | POA: Diagnosis not present

## 2021-09-05 DIAGNOSIS — M797 Fibromyalgia: Secondary | ICD-10-CM | POA: Diagnosis not present

## 2021-09-05 DIAGNOSIS — G8929 Other chronic pain: Secondary | ICD-10-CM | POA: Diagnosis not present

## 2021-09-05 DIAGNOSIS — I10 Essential (primary) hypertension: Secondary | ICD-10-CM

## 2021-09-05 MED ORDER — LIDOCAINE HCL 1 % IJ SOLN
1.5000 mL | INTRAMUSCULAR | Status: AC | PRN
Start: 2021-09-05 — End: 2021-09-05
  Administered 2021-09-05: 1.5 mL

## 2021-09-05 MED ORDER — TRIAMCINOLONE ACETONIDE 40 MG/ML IJ SUSP
40.0000 mg | INTRAMUSCULAR | Status: AC | PRN
  Administered 2021-09-05: 40 mg via INTRA_ARTICULAR

## 2021-09-05 NOTE — Patient Instructions (Signed)
Knee Exercises Ask your health care provider which exercises are safe for you. Do exercises exactly as told by your health care provider and adjust them as directed. It is normal to feel mild stretching, pulling, tightness, or discomfort as you do these exercises. Stop right away if you feel sudden pain or your pain gets worse. Do not begin these exercises until told by your health care provider. Stretching and range-of-motion exercises These exercises warm up your muscles and joints and improve the movement and flexibility of your knee. These exercises also help to relieve pain and swelling. Knee extension, prone Lie on your abdomen (prone position) on a bed. Place your left / right knee just beyond the edge of the surface so your knee is not on the bed. You can put a towel under your left / right thigh just above your kneecap for comfort. Relax your leg muscles and allow gravity to straighten your knee (extension). You should feel a stretch behind your left / right knee. Hold this position for __________ seconds. Scoot up so your knee is supported between repetitions. Repeat __________ times. Complete this exercise __________ times a day. Knee flexion, active  Lie on your back with both legs straight. If this causes back discomfort, bend your left / right knee so your foot is flat on the floor. Slowly slide your left / right heel back toward your buttocks. Stop when you feel a gentle stretch in the front of your knee or thigh (flexion). Hold this position for __________ seconds. Slowly slide your left / right heel back to the starting position. Repeat __________ times. Complete this exercise __________ times a day. Quadriceps stretch, prone  Lie on your abdomen on a firm surface, such as a bed or padded floor. Bend your left / right knee and hold your ankle. If you cannot reach your ankle or pant leg, loop a belt around your foot and grab the belt instead. Gently pull your heel toward your  buttocks. Your knee should not slide out to the side. You should feel a stretch in the front of your thigh and knee (quadriceps). Hold this position for __________ seconds. Repeat __________ times. Complete this exercise __________ times a day. Hamstring, supine Lie on your back (supine position). Loop a belt or towel over the ball of your left / right foot. The ball of your foot is on the walking surface, right under your toes. Straighten your left / right knee and slowly pull on the belt to raise your leg until you feel a gentle stretch behind your knee (hamstring). Do not let your knee bend while you do this. Keep your other leg flat on the floor. Hold this position for __________ seconds. Repeat __________ times. Complete this exercise __________ times a day. Strengthening exercises These exercises build strength and endurance in your knee. Endurance is the ability to use your muscles for a long time, even after they get tired. Quadriceps, isometric This exercise stretches the muscles in front of your thigh (quadriceps) without moving your knee joint (isometric). Lie on your back with your left / right leg extended and your other knee bent. Put a rolled towel or small pillow under your knee if told by your health care provider. Slowly tense the muscles in the front of your left / right thigh. You should see your kneecap slide up toward your hip or see increased dimpling just above the knee. This motion will push the back of the knee toward the floor. For __________   seconds, hold the muscle as tight as you can without increasing your pain. Relax the muscles slowly and completely. Repeat __________ times. Complete this exercise __________ times a day. Straight leg raises This exercise stretches the muscles in front of your thigh (quadriceps) and the muscles that move your hips (hip flexors). Lie on your back with your left / right leg extended and your other knee bent. Tense the muscles in  the front of your left / right thigh. You should see your kneecap slide up or see increased dimpling just above the knee. Your thigh may even shake a bit. Keep these muscles tight as you raise your leg 4-6 inches (10-15 cm) off the floor. Do not let your knee bend. Hold this position for __________ seconds. Keep these muscles tense as you lower your leg. Relax your muscles slowly and completely after each repetition. Repeat __________ times. Complete this exercise __________ times a day. Hamstring, isometric Lie on your back on a firm surface. Bend your left / right knee about __________ degrees. Dig your left / right heel into the surface as if you are trying to pull it toward your buttocks. Tighten the muscles in the back of your thighs (hamstring) to "dig" as hard as you can without increasing any pain. Hold this position for __________ seconds. Release the tension gradually and allow your muscles to relax completely for __________ seconds after each repetition. Repeat __________ times. Complete this exercise __________ times a day. Hamstring curls If told by your health care provider, do this exercise while wearing ankle weights. Begin with __________ lb weights. Then increase the weight by 1 lb (0.5 kg) increments. Do not wear ankle weights that are more than __________ lb. Lie on your abdomen with your legs straight. Bend your left / right knee as far as you can without feeling pain. Keep your hips flat against the floor. Hold this position for __________ seconds. Slowly lower your leg to the starting position. Repeat __________ times. Complete this exercise __________ times a day. Squats This exercise strengthens the muscles in front of your thigh and knee (quadriceps). Stand in front of a table, with your feet and knees pointing straight ahead. You may rest your hands on the table for balance but not for support. Slowly bend your knees and lower your hips like you are going to sit in a  chair. Keep your weight over your heels, not over your toes. Keep your lower legs upright so they are parallel with the table legs. Do not let your hips go lower than your knees. Do not bend lower than told by your health care provider. If your knee pain increases, do not bend as low. Hold the squat position for __________ seconds. Slowly push with your legs to return to standing. Do not use your hands to pull yourself to standing. Repeat __________ times. Complete this exercise __________ times a day. Wall slides This exercise strengthens the muscles in front of your thigh and knee (quadriceps). Lean your back against a smooth wall or door, and walk your feet out 18-24 inches (46-61 cm) from it. Place your feet hip-width apart. Slowly slide down the wall or door until your knees bend __________ degrees. Keep your knees over your heels, not over your toes. Keep your knees in line with your hips. Hold this position for __________ seconds. Repeat __________ times. Complete this exercise __________ times a day. Straight leg raises This exercise strengthens the muscles that rotate the leg at the hip and   move it away from your body (hip abductors). Lie on your side with your left / right leg in the top position. Lie so your head, shoulder, knee, and hip line up. You may bend your bottom knee to help you keep your balance. Roll your hips slightly forward so your hips are stacked directly over each other and your left / right knee is facing forward. Leading with your heel, lift your top leg 4-6 inches (10-15 cm). You should feel the muscles in your outer hip lifting. Do not let your foot drift forward. Do not let your knee roll toward the ceiling. Hold this position for __________ seconds. Slowly return your leg to the starting position. Let your muscles relax completely after each repetition. Repeat __________ times. Complete this exercise __________ times a day. Straight leg raises This  exercise stretches the muscles that move your hips away from the front of the pelvis (hip extensors). Lie on your abdomen on a firm surface. You can put a pillow under your hips if that is more comfortable. Tense the muscles in your buttocks and lift your left / right leg about 4-6 inches (10-15 cm). Keep your knee straight as you lift your leg. Hold this position for __________ seconds. Slowly lower your leg to the starting position. Let your leg relax completely after each repetition. Repeat __________ times. Complete this exercise __________ times a day. This information is not intended to replace advice given to you by your health care provider. Make sure you discuss any questions you have with your health care provider. Document Revised: 09/23/2018 Document Reviewed: 09/23/2018 Elsevier Patient Education  2022 [employer]  

## 2021-09-19 ENCOUNTER — Telehealth: Payer: Self-pay | Admitting: Neurology

## 2021-09-19 NOTE — Telephone Encounter (Signed)
Pt has called to report that she is not getting to bed before 3am and 5 am as a result of the pregabalin (LYRICA) , pt is asking for a call to discuss.

## 2021-09-19 NOTE — Telephone Encounter (Signed)
Per note from 08/29/21:  I spoke to the patient. Reports bilateral lower extremity pain during the day. She has a prescription for pregabalin 50mg , one cap BID. She is unable to take it during the day. Feels off balance and dizzy. The bedtime dose works well and allows her to sleep throughout the night. She would like an alternate plan for the daytime discomfort.   Per vo by Dr. April Manson, she may try pregabalin 25mg  in am and 50mg  in pm. Hopefully, she will experience greater tolerability during the day and have reduced pain. She is agreeable to this plan. She just picked up #180 capsules of pregabalin 50mg . She will continue taking one at bedtime. She would like a 90-day supply of pregabalin 25mg , one cap in am sent to the pharmacy. She will keep her follow up on 11/02/21.

## 2021-09-19 NOTE — Telephone Encounter (Addendum)
I spoke to the patient. She confirmed she is taking pregabalin 25mg , one cap around 8am and pregabalin 50mg , one cap around 10pm  States this regimen is keeping her neuropathic pain under good control.  I educated her that insomnia is not normally caused by pregabalin. Somnolence is more often the side effect we would expect.  I ask her about other health issues. Says she has never been a great sleeper but it has been getting worse. Melatonin 10mg  helped her sleep last night but she felt dizzy this morning. She is going to try a lower dose to see if it works as well but doesn't have the same hangover effect. She understands that sleep patterns may change with aging.   Reviewed natural things she can do to help sleep. Maintain cool, dark, quiet room. Avoid electronics (TV, phones). Avoid caffeine in evenings. Important to go to bed and wake up around the same time.  Her back pain has also been worse. I told her pain is a known cause to affect sleep. She has an appt with Dr. Nelva Bush to set up a repeat ESI.  If the sleep issues continue, she is going to discuss further with her PCP.

## 2021-09-20 ENCOUNTER — Encounter: Payer: Self-pay | Admitting: Cardiovascular Disease

## 2021-09-20 ENCOUNTER — Ambulatory Visit: Payer: PPO | Admitting: Cardiovascular Disease

## 2021-09-20 ENCOUNTER — Other Ambulatory Visit: Payer: Self-pay

## 2021-09-20 DIAGNOSIS — I10 Essential (primary) hypertension: Secondary | ICD-10-CM

## 2021-09-20 DIAGNOSIS — E782 Mixed hyperlipidemia: Secondary | ICD-10-CM | POA: Diagnosis not present

## 2021-09-20 NOTE — Assessment & Plan Note (Addendum)
History of hyperlipidemia not on statin therapy with lipid profile performed 02/01/2021 revealing total cholesterol 204, LDL 121 and HDL 55.
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
## 2021-09-20 NOTE — Assessment & Plan Note (Signed)
History of essential hypertension blood pressure measured today 130/62.  She is on amlodipine and Avapro as well as atenolol.

## 2021-09-20 NOTE — Patient Instructions (Signed)

## 2021-09-25 DIAGNOSIS — M545 Low back pain, unspecified: Secondary | ICD-10-CM | POA: Diagnosis not present

## 2021-09-25 DIAGNOSIS — M5416 Radiculopathy, lumbar region: Secondary | ICD-10-CM | POA: Diagnosis not present

## 2021-09-26 DIAGNOSIS — H02132 Senile ectropion of right lower eyelid: Secondary | ICD-10-CM | POA: Diagnosis not present

## 2021-09-26 DIAGNOSIS — H04123 Dry eye syndrome of bilateral lacrimal glands: Secondary | ICD-10-CM | POA: Diagnosis not present

## 2021-09-26 DIAGNOSIS — H02135 Senile ectropion of left lower eyelid: Secondary | ICD-10-CM | POA: Diagnosis not present

## 2021-09-26 DIAGNOSIS — H04203 Unspecified epiphora, bilateral lacrimal glands: Secondary | ICD-10-CM | POA: Diagnosis not present

## 2021-09-27 DIAGNOSIS — E538 Deficiency of other specified B group vitamins: Secondary | ICD-10-CM | POA: Diagnosis not present

## 2021-09-27 DIAGNOSIS — I5032 Chronic diastolic (congestive) heart failure: Secondary | ICD-10-CM | POA: Diagnosis not present

## 2021-09-27 DIAGNOSIS — L03019 Cellulitis of unspecified finger: Secondary | ICD-10-CM | POA: Diagnosis not present

## 2021-09-27 DIAGNOSIS — Z23 Encounter for immunization: Secondary | ICD-10-CM | POA: Diagnosis not present

## 2021-09-27 DIAGNOSIS — E063 Autoimmune thyroiditis: Secondary | ICD-10-CM | POA: Diagnosis not present

## 2021-09-27 DIAGNOSIS — M795 Residual foreign body in soft tissue: Secondary | ICD-10-CM | POA: Diagnosis not present

## 2021-09-27 DIAGNOSIS — Z6827 Body mass index (BMI) 27.0-27.9, adult: Secondary | ICD-10-CM | POA: Diagnosis not present

## 2021-09-27 DIAGNOSIS — G629 Polyneuropathy, unspecified: Secondary | ICD-10-CM | POA: Diagnosis not present

## 2021-09-27 DIAGNOSIS — E663 Overweight: Secondary | ICD-10-CM | POA: Diagnosis not present

## 2021-10-16 DIAGNOSIS — E538 Deficiency of other specified B group vitamins: Secondary | ICD-10-CM | POA: Diagnosis not present

## 2021-10-24 DIAGNOSIS — M5416 Radiculopathy, lumbar region: Secondary | ICD-10-CM | POA: Diagnosis not present

## 2021-11-02 ENCOUNTER — Ambulatory Visit: Payer: PPO | Admitting: Neurology

## 2021-11-03 DIAGNOSIS — E538 Deficiency of other specified B group vitamins: Secondary | ICD-10-CM | POA: Diagnosis not present

## 2021-11-03 DIAGNOSIS — A084 Viral intestinal infection, unspecified: Secondary | ICD-10-CM | POA: Diagnosis not present

## 2021-11-03 DIAGNOSIS — E663 Overweight: Secondary | ICD-10-CM | POA: Diagnosis not present

## 2021-11-03 DIAGNOSIS — Z6826 Body mass index (BMI) 26.0-26.9, adult: Secondary | ICD-10-CM | POA: Diagnosis not present

## 2021-11-29 NOTE — Progress Notes (Signed)
Office Visit Note  Patient: Jo Parrish             Date of Birth: 1926-11-03           MRN: [id number]             PCP: Redmond School, MD Referring: Redmond School, MD Visit Date: 12/01/2021 Occupation: @GUAROCC @  Subjective:  Pain in right shoulder and left knee   History of Present Illness: Jo Parrish is a 87 y.o. female with a history of osteoarthritis, degenerative disc disease, sicca symptoms and osteoporosis.  She states she had good response to previous injections.  She has developed increased pain and discomfort in her right shoulder joint and has difficulty lifting her right arm.  She requests another cortisone injection to her right shoulder joint.  Last right shoulder cortisone injection was in July 2022.  She had left knee joint injection in September 2022.  She states she has been having increased pain and discomfort in her left knee again.  She states her left knee joint has been popping and causing discomfort.  She does not want to have surgery.  She wants to have another cortisone injection.  She has been using a brace.  She continues to have sicca symptoms.  She sees Dr. Herma Mering for right knee joint discomfort.  She states she has had cortisone injections frequently but they do not last long.  She continues to have lower back pain.  She is on a drug holiday from [place] currently.  She continues to have some generalized pain and discomfort from fibromyalgia.  Activities of Daily Living:  Patient reports morning stiffness for several  hours.   Patient Reports nocturnal pain.  Difficulty dressing/grooming: Reports Difficulty climbing stairs: Reports Difficulty getting out of chair: Reports Difficulty using hands for taps, buttons, cutlery, and/or writing: Reports  Review of Systems  Constitutional:  Positive for fatigue.  HENT:  Positive for mouth dryness and nose dryness. Negative for mouth sores.   Eyes:  Positive for dryness. Negative for pain and itching.   Respiratory:  Positive for shortness of breath. Negative for difficulty breathing.   Cardiovascular:  Negative for chest pain and palpitations.  Gastrointestinal:  Negative for constipation and diarrhea.  Endocrine: Negative for increased urination.  Genitourinary:  Negative for difficulty urinating.  Musculoskeletal:  Positive for joint pain, joint pain and joint swelling. Negative for myalgias, morning stiffness, muscle tenderness and myalgias.  Skin:  Negative for color change, rash and redness.  Allergic/Immunologic: Negative for susceptible to infections.  Neurological:  Positive for numbness and weakness. Negative for dizziness, headaches and memory loss.  Hematological:  Negative for bruising/bleeding tendency.  Psychiatric/Behavioral:  Negative for confusion.    PMFS History:  Patient Active Problem List   Diagnosis Date Noted   Mild aortic stenosis 12/12/2018   UI (urinary incontinence) 01/16/2016   UTI (lower urinary tract infection) 09/13/2015   Hematuria 09/13/2015   Cough 09/13/2015   Wheezing on auscultation 09/13/2015   Vulvar itching 08/15/2015   Vaginal atrophy 08/02/2015   Urinary frequency 07/04/2015   Vaginal burning 07/04/2015   Vaginal dryness 07/04/2015   Rash, legs 06/21/2015   Constipation 01/07/2014   Anemia 10/20/2013   Heme positive stool 10/20/2013   Abnormal LFTs 10/20/2013   Nausea alone 10/20/2013   Cellulitis of leg, right 10/16/2013   Unspecified constipation 10/16/2013   Postoperative anemia due to acute blood loss 10/12/2013   Hyponatremia 10/12/2013   OA (osteoarthritis) of knee 10/09/2013  Heart palpitations 10/07/2013   Hypothyroidism 08/13/2013   Chronic diastolic heart failure ([place]) 08/06/2013   Essential hypertension 08/06/2013   Hyperlipidemia 08/06/2013   Knee pain 03/03/2013    Past Medical History:  Diagnosis Date   Bacterial overgrowth syndrome    Small bowel   Complication of anesthesia    Constipation    Cough  09/13/2015   GERD (gastroesophageal reflux disease)    Hematuria 09/13/2015   Hyperlipidemia    Hypertension    Hypothyroidism    Left bundle branch block    Neuropathy    OSA (obstructive sleep apnea)    UNABLE TO TOLERATE  MASK   PONV (postoperative nausea and vomiting)    Rheumatoid arthritis(714.0)    Sjogren's syndrome (HCC)    UI (urinary incontinence) 01/16/2016   Ulcer, esophagus    Urinary frequency 07/04/2015   UTI (lower urinary tract infection) 09/13/2015   Vaginal atrophy 08/02/2015   Vaginal burning 07/04/2015   Vaginal dryness 07/04/2015   Vulvar itching 08/15/2015   Wheezing on auscultation 09/13/2015    Family History  Problem Relation Age of Onset   Stroke Mother    Heart disease Father    Liver disease Father    COPD Son    Sleep apnea Son    Stroke Maternal Grandmother    Past Surgical History:  Procedure Laterality Date   APPENDECTOMY     COLONOSCOPY  June 2014   Dr. Earlean Shawl, multiple tubular adenomas removed, internal hemorrhoids. Next colonoscopy in 3 years.   DILATION AND CURETTAGE OF UTERUS     ESOPHAGOGASTRODUODENOSCOPY  06/07/2008    Gastritis. No celiac sprue/Small hiatal hernia   ESOPHAGOGASTRODUODENOSCOPY  June 2014   Dr. Earlean Shawl, erosive esophagitis, grade B., large hiatal hernia.   Hydrogen breath test   05/17/2008   small bowel bacterial overgrowth/Hydrogen level rise consistent with small bowel bacterial   TONSILLECTOMY     TOTAL KNEE ARTHROPLASTY Right 10/09/2013   Procedure: RIGHT TOTAL KNEE ARTHROPLASTY;  Surgeon: Gearlean Alf, MD;  Location: [facility];  Service: Orthopedics;  Laterality: Right;   Social History   Social History Narrative   Lives alone   Right Handed   Drinks 1-2 cups caffeine daily   Immunization History  Administered Date(s) Administered   Moderna Sars-Covid-2 Vaccination 01/08/2020, 02/03/2020, 10/29/2020     Objective: Vital Signs: BP 130/77 (BP Location: Left Arm, Patient Position: Sitting, Cuff  Size: Normal)    Pulse 72    Ht 5\' 2"  (1.575 m)    Wt 155 lb 3.2 oz (70.4 kg)    BMI 28.39 kg/m    Physical Exam Vitals and nursing note reviewed.  Constitutional:      Appearance: She is well-developed.  HENT:     Head: Normocephalic and atraumatic.  Eyes:     Conjunctiva/sclera: Conjunctivae normal.  Cardiovascular:     Rate and Rhythm: Normal rate and regular rhythm.     Heart sounds: Normal heart sounds.  Pulmonary:     Effort: Pulmonary effort is normal.     Breath sounds: Normal breath sounds.  Abdominal:     General: Bowel sounds are normal.     Palpations: Abdomen is soft.  Musculoskeletal:     Cervical back: Normal range of motion.  Lymphadenopathy:     Cervical: No cervical adenopathy.  Skin:    General: Skin is warm and dry.     Capillary Refill: Capillary refill takes less than 2 seconds.  Neurological:     Mental  Status: She is alert and oriented to person, place, and time.  Psychiatric:        Behavior: Behavior normal.     Musculoskeletal Exam: C-spine was in good range of motion.  Right shoulder joint abduction was limited to 60 degrees and painful.  Left shoulder joint was in full range of motion.  Elbow joints were in full range of motion with no discomfort.  Wrist joints and MCPs with good range of motion.  She had bilateral PIP and DIP thickening.  She had good range of motion of her hip joints.  She had right total knee replacement.  Left knee joint was in good range of motion without any warmth swelling or effusion.  There was no tenderness over ankles or MTPs.  CDAI Exam: CDAI Score: -- Patient Global: --; Provider Global: -- Swollen: --; Tender: -- Joint Exam 12/01/2021   No joint exam has been documented for this visit   There is currently no information documented on the homunculus. Go to the Rheumatology activity and complete the homunculus joint exam.  Investigation: No additional findings.  Imaging: No results found.  Recent Labs: Lab  Results  Component Value Date   WBC 5.5 06/29/2021   HGB 12.8 06/29/2021   PLT 206 06/29/2021   NA 132 (L) 06/29/2021   K 4.4 06/29/2021   CL 100 06/29/2021   CO2 26 06/29/2021   GLUCOSE 118 (H) 06/29/2021   BUN 11 06/29/2021   CREATININE 0.71 06/29/2021   BILITOT 0.6 06/29/2021   ALKPHOS 40 06/29/2021   AST 22 06/29/2021   ALT 20 06/29/2021   PROT 7.0 06/29/2021   ALBUMIN 4.2 06/29/2021   CALCIUM 8.8 (L) 06/29/2021   GFRAA 58 (L) 06/11/2018    Speciality Comments: Reclast 2017-2022  Procedures:  Large Joint Inj: R glenohumeral on 12/01/2021 11:21 AM Indications: pain Details: 27 G 1.5 in needle, posterior approach  Arthrogram: No  Medications: 1.5 mL lidocaine 1 %; 40 mg triamcinolone acetonide 40 MG/ML Aspirate: 0 mL Outcome: tolerated well, no immediate complications Procedure, treatment alternatives, risks and benefits explained, specific risks discussed. Consent was given by the patient. Immediately prior to procedure a time out was called to verify the correct patient, procedure, equipment, support staff and site/side marked as required. Patient was prepped and draped in the usual sterile fashion.    Large Joint Inj on 12/01/2021 11:22 AM Indications: pain Details: 27 G 1.5 in needle, medial approach  Arthrogram: No  Medications: 40 mg triamcinolone acetonide 40 MG/ML; 1.5 mL lidocaine 1 % Aspirate: 0 mL Outcome: tolerated well, no immediate complications Procedure, treatment alternatives, risks and benefits explained, specific risks discussed. Consent was given by the patient. Immediately prior to procedure a time out was called to verify the correct patient, procedure, equipment, support staff and site/side marked as required. Patient was prepped and draped in the usual sterile fashion.    Allergies: Amoxicillin, Doxycycline, Gabapentin, Levaquin [levofloxacin in d5w], Prednisone, Statins, Sucralfate, Sulfa antibiotics, Tramadol, Fenofibrate micronized, and  Tizanidine   Assessment / Plan:     Visit Diagnoses: Sicca complex ([place]) - History of dry mouth and dry eyes for many years.  She was diagnosed with Sjogren's at [facility].  Patient declined autoimmune work-up.  She states her symptoms are manageable with over-the-counter products.  Primary osteoarthritis of both shoulders -she has limited range of motion of the right shoulder joint with pain and discomfort.  She has been experiencing nocturnal pain and difficulty doing routine activities.  The x-rays obtained on July 12, 2021 showed osteoarthritic changes in the right shoulder joint.  Her right shoulder was injected on July 12, 2021.  She had good response to the cortisone injection.  She wants repeat injection.  Indications side effects contraindications were discussed.  After informed consent was obtained per patient's request right shoulder joint was injected with cortisone as described above.  She tolerated the procedure well.  Postprocedure instructions were given.  Status post total knee replacement, right - By Dr. Maureen Ralphs in 2014.  She continues to have right knee pain.  Chronic pain of left knee -she complains of pain and discomfort in her left knee.  No warmth swelling or effusion was noted.  X-rays from September 05, 2021 were reviewed which showed moderate to severe lateral compartment narrowing and moderate patellofemoral narrowing.  She had cortisone injection on September 05, 2021.  She states it lasted until recently but now the pain has become severe and she would like to have cortisone injection.  Side effects of cortisone injections were discussed.  Left knee joint was injected as described above.  She tolerated the procedure well.  I will also apply for Visco supplement injections.  This patient is diagnosed with osteoarthritis of the knee(s).    Radiographs show evidence of joint space narrowing, osteophytes, subchondral sclerosis and/or subchondral cysts.  This patient  has knee pain which interferes with functional and activities of daily living.    This patient has experienced inadequate response, adverse effects and/or intolerance with conservative treatments such as acetaminophen, NSAIDS, topical creams, physical therapy or regular exercise, knee bracing and/or weight loss.   This patient has experienced inadequate response or has a contraindication to intra articular steroid injections for at least 3 months.   This patient is not scheduled to have a total knee replacement within 6 months of starting treatment with viscosupplementation.   DDD (degenerative disc disease), lumbar -  She gets cortisone injections at [facility].  She is also followed by Dr. Nelva Bush.  Neuropathy-she continues to have discomfort from neuropathy in her upper and lower extremities.  Age-related osteoporosis without current pathological fracture - She was treated with Reclast treated from 2015 through 2022.  She is currently on drug holiday.  Fibromyalgia-she has generalized pain and discomfort from fibromyalgia.  Chronic pain syndrome-she is in chronic pain.  Other medical problems are listed as follows:  Chronic diastolic heart failure (HCC)  Essential hypertension-blood pressure is normal today.  Vitamin B12 deficiency  History of IBS  History of hyperlipidemia  Mild aortic stenosis  History of hypothyroidism  Orders: Orders Placed This Encounter  Procedures   Large Joint Inj   Large Joint Inj   No orders of the defined types were placed in this encounter.    Follow-Up Instructions: Return in about 3 months (around 03/01/2022) for Osteoarthritis, Osteoporosis.   Bo Merino, MD  Note - This record has been created using Editor, commissioning.  Chart creation errors have been sought, but may not always  have been located. Such creation errors do not reflect on  the standard of medical care.

## 2021-12-01 ENCOUNTER — Telehealth: Payer: Self-pay

## 2021-12-01 ENCOUNTER — Other Ambulatory Visit: Payer: Self-pay

## 2021-12-01 ENCOUNTER — Encounter: Payer: Self-pay | Admitting: Rheumatology

## 2021-12-01 ENCOUNTER — Ambulatory Visit: Payer: PPO | Admitting: Rheumatology

## 2021-12-01 VITALS — BP 130/77 | HR 72 | Ht 62.0 in | Wt 155.2 lb

## 2021-12-01 DIAGNOSIS — M5136 Other intervertebral disc degeneration, lumbar region: Secondary | ICD-10-CM | POA: Diagnosis not present

## 2021-12-01 DIAGNOSIS — Z8639 Personal history of other endocrine, nutritional and metabolic disease: Secondary | ICD-10-CM

## 2021-12-01 DIAGNOSIS — M810 Age-related osteoporosis without current pathological fracture: Secondary | ICD-10-CM

## 2021-12-01 DIAGNOSIS — I5032 Chronic diastolic (congestive) heart failure: Secondary | ICD-10-CM | POA: Diagnosis not present

## 2021-12-01 DIAGNOSIS — E538 Deficiency of other specified B group vitamins: Secondary | ICD-10-CM

## 2021-12-01 DIAGNOSIS — M797 Fibromyalgia: Secondary | ICD-10-CM | POA: Diagnosis not present

## 2021-12-01 DIAGNOSIS — G8929 Other chronic pain: Secondary | ICD-10-CM

## 2021-12-01 DIAGNOSIS — M19012 Primary osteoarthritis, left shoulder: Secondary | ICD-10-CM | POA: Diagnosis not present

## 2021-12-01 DIAGNOSIS — Z96651 Presence of right artificial knee joint: Secondary | ICD-10-CM

## 2021-12-01 DIAGNOSIS — M25562 Pain in left knee: Secondary | ICD-10-CM

## 2021-12-01 DIAGNOSIS — M3500 Sicca syndrome, unspecified: Secondary | ICD-10-CM | POA: Diagnosis not present

## 2021-12-01 DIAGNOSIS — I10 Essential (primary) hypertension: Secondary | ICD-10-CM

## 2021-12-01 DIAGNOSIS — G894 Chronic pain syndrome: Secondary | ICD-10-CM | POA: Diagnosis not present

## 2021-12-01 DIAGNOSIS — M19011 Primary osteoarthritis, right shoulder: Secondary | ICD-10-CM | POA: Diagnosis not present

## 2021-12-01 DIAGNOSIS — I350 Nonrheumatic aortic (valve) stenosis: Secondary | ICD-10-CM

## 2021-12-01 DIAGNOSIS — G629 Polyneuropathy, unspecified: Secondary | ICD-10-CM | POA: Diagnosis not present

## 2021-12-01 DIAGNOSIS — Z8719 Personal history of other diseases of the digestive system: Secondary | ICD-10-CM

## 2021-12-01 MED ORDER — LIDOCAINE HCL 1 % IJ SOLN
1.5000 mL | INTRAMUSCULAR | Status: AC | PRN
  Administered 2021-12-01: 1.5 mL

## 2021-12-01 MED ORDER — TRIAMCINOLONE ACETONIDE 40 MG/ML IJ SUSP
40.0000 mg | INTRAMUSCULAR | Status: AC | PRN
  Administered 2021-12-01: 40 mg via INTRA_ARTICULAR

## 2021-12-01 MED ORDER — LIDOCAINE HCL 1 % IJ SOLN
1.5000 mL | INTRAMUSCULAR | Status: AC | PRN
Start: 2021-12-01 — End: 2021-12-01
  Administered 2021-12-01: 1.5 mL

## 2021-12-01 NOTE — Telephone Encounter (Signed)
Please apply for left knee visco, per Dr. Estanislado Pandy. Thanks!

## 2021-12-21 NOTE — Progress Notes (Signed)
Office Visit Note  Patient: Jo Parrish             Date of Birth: Jan 15, 1926           MRN: [id number]             PCP: Redmond School, MD Referring: Redmond School, MD Visit Date: 12/22/2021 Occupation: @GUAROCC @  Subjective:  Pain in both knees and both ankles   History of Present Illness: Jo Parrish is a 88 y.o. female with history of sicca symptoms, osteoarthritis, DDD, and osteoporosis.  She presents today with pain involving multiple joints.  Her pain is been most severe in both lower extremities due to neuropathy.  She is no longer taking Lyrica since she could not tolerate it due to brain fog.  She has not followed back up with a neurologist to discuss other treatment options.  She is taking over-the-counter nerve comfort supplement but has not noticed much improvement in her symptoms.  She continues to have persistent pain in the right shoulder.  She states that she did not notice any improvement in the right shoulder discomfort after having a cortisone injection on 12/01/2021. She has ongoing pain in the left knee joint despite having a cortisone injection on 12/01/2021.  She has tried using Voltaren gel in the past with no improvement in her symptoms.  She is currently awaiting approval for Visco gel injections.  She has been taking tramadol 50 mg 1 to 2 tablets by mouth 1-2 times per day as needed for pain relief.    Activities of Daily Living:  Patient reports morning stiffness for 15 minutes.   Patient Reports nocturnal pain.  Difficulty dressing/grooming: Reports Difficulty climbing stairs: Reports Difficulty getting out of chair: Reports Difficulty using hands for taps, buttons, cutlery, and/or writing: Denies  Review of Systems  Constitutional:  Positive for fatigue.  HENT:  Positive for mouth dryness.   Eyes:  Positive for dryness.  Respiratory:  Positive for shortness of breath.   Cardiovascular:  Negative for swelling in legs/feet.  Gastrointestinal:   Positive for constipation.  Endocrine: Positive for cold intolerance and heat intolerance.  Genitourinary:  Negative for difficulty urinating.  Musculoskeletal:  Positive for joint pain, gait problem, joint pain, muscle weakness and morning stiffness.  Skin:  Negative for rash.  Allergic/Immunologic: Negative for susceptible to infections.  Neurological:  Positive for numbness and weakness.  Hematological:  Negative for bruising/bleeding tendency.  Psychiatric/Behavioral:  Negative for sleep disturbance.    PMFS History:  Patient Active Problem List   Diagnosis Date Noted   Mild aortic stenosis 12/12/2018   UI (urinary incontinence) 01/16/2016   UTI (lower urinary tract infection) 09/13/2015   Hematuria 09/13/2015   Cough 09/13/2015   Wheezing on auscultation 09/13/2015   Vulvar itching 08/15/2015   Vaginal atrophy 08/02/2015   Urinary frequency 07/04/2015   Vaginal burning 07/04/2015   Vaginal dryness 07/04/2015   Rash, legs 06/21/2015   Constipation 01/07/2014   Anemia 10/20/2013   Heme positive stool 10/20/2013   Abnormal LFTs 10/20/2013   Nausea alone 10/20/2013   Cellulitis of leg, right 10/16/2013   Unspecified constipation 10/16/2013   Postoperative anemia due to acute blood loss 10/12/2013   Hyponatremia 10/12/2013   OA (osteoarthritis) of knee 10/09/2013   Heart palpitations 10/07/2013   Hypothyroidism 08/13/2013   Chronic diastolic heart failure ([place]) 08/06/2013   Essential hypertension 08/06/2013   Hyperlipidemia 08/06/2013   Knee pain 03/03/2013    Past Medical History:  Diagnosis  Date   Bacterial overgrowth syndrome    Small bowel   Complication of anesthesia    Constipation    Cough 09/13/2015   GERD (gastroesophageal reflux disease)    Hematuria 09/13/2015   Hyperlipidemia    Hypertension    Hypothyroidism    Left bundle branch block    Neuropathy    OSA (obstructive sleep apnea)    UNABLE TO TOLERATE  MASK   PONV (postoperative nausea and  vomiting)    Rheumatoid arthritis(714.0)    Sjogren's syndrome ([facility])    UI (urinary incontinence) 01/16/2016   Ulcer, esophagus    Urinary frequency 07/04/2015   UTI (lower urinary tract infection) 09/13/2015   Vaginal atrophy 08/02/2015   Vaginal burning 07/04/2015   Vaginal dryness 07/04/2015   Vulvar itching 08/15/2015   Wheezing on auscultation 09/13/2015    Family History  Problem Relation Age of Onset   Stroke Mother    Heart disease Father    Liver disease Father    COPD Son    Sleep apnea Son    Stroke Maternal Grandmother    Past Surgical History:  Procedure Laterality Date   APPENDECTOMY     COLONOSCOPY  June 2014   Dr. Earlean Shawl, multiple tubular adenomas removed, internal hemorrhoids. Next colonoscopy in 3 years.   DILATION AND CURETTAGE OF UTERUS     ESOPHAGOGASTRODUODENOSCOPY  06/07/2008    Gastritis. No celiac sprue/Small hiatal hernia   ESOPHAGOGASTRODUODENOSCOPY  June 2014   Dr. Earlean Shawl, erosive esophagitis, grade B., large hiatal hernia.   Hydrogen breath test   05/17/2008   small bowel bacterial overgrowth/Hydrogen level rise consistent with small bowel bacterial   TONSILLECTOMY     TOTAL KNEE ARTHROPLASTY Right 10/09/2013   Procedure: RIGHT TOTAL KNEE ARTHROPLASTY;  Surgeon: Gearlean Alf, MD;  Location: [facility];  Service: Orthopedics;  Laterality: Right;   Social History   Social History Narrative   Lives alone   Right Handed   Drinks 1-2 cups caffeine daily   Immunization History  Administered Date(s) Administered   Moderna Sars-Covid-2 Vaccination 01/08/2020, 02/03/2020, 10/29/2020     Objective: Vital Signs: BP 125/63 (BP Location: Left Arm, Patient Position: Sitting, Cuff Size: Normal)    Pulse 70    Resp 17    Ht 5\' 2"  (1.575 m)    Wt 154 lb (69.9 kg)    BMI 28.17 kg/m    Physical Exam Vitals and nursing note reviewed.  Constitutional:      Appearance: She is well-developed.  HENT:     Head: Normocephalic and atraumatic.  Eyes:      Conjunctiva/sclera: Conjunctivae normal.  Pulmonary:     Effort: Pulmonary effort is normal.  Abdominal:     Palpations: Abdomen is soft.  Musculoskeletal:     Cervical back: Normal range of motion.  Skin:    General: Skin is warm and dry.     Capillary Refill: Capillary refill takes less than 2 seconds.  Neurological:     Mental Status: She is alert and oriented to person, place, and time.  Psychiatric:        Behavior: Behavior normal.     Musculoskeletal Exam: C-spine has good range of motion with no discomfort.  Right shoulder abduction to about 60 degrees.  Left shoulder has full range of motion with no discomfort at this time.  Elbow joints have good range of motion with no tenderness or inflammation.  Wrist joints, MCPs, PIPs, DIPs have good range of motion with  no synovitis.  PIP and DIP thickening consistent with osteoarthritis of both hands.  Hip joints have good range of motion with no groin pain.  Right knee replacement has good range of motion with some warmth.  Painful range of motion of the left knee joint.  Ankle joints have good range of motion with no joint tenderness.  CDAI Exam: CDAI Score: -- Patient Global: --; Provider Global: -- Swollen: --; Tender: -- Joint Exam 12/22/2021   No joint exam has been documented for this visit   There is currently no information documented on the homunculus. Go to the Rheumatology activity and complete the homunculus joint exam.  Investigation: No additional findings.  Imaging: No results found.  Recent Labs: Lab Results  Component Value Date   WBC 5.5 06/29/2021   HGB 12.8 06/29/2021   PLT 206 06/29/2021   NA 132 (L) 06/29/2021   K 4.4 06/29/2021   CL 100 06/29/2021   CO2 26 06/29/2021   GLUCOSE 118 (H) 06/29/2021   BUN 11 06/29/2021   CREATININE 0.71 06/29/2021   BILITOT 0.6 06/29/2021   ALKPHOS 40 06/29/2021   AST 22 06/29/2021   ALT 20 06/29/2021   PROT 7.0 06/29/2021   ALBUMIN 4.2 06/29/2021   CALCIUM 8.8  (L) 06/29/2021   GFRAA 58 (L) 06/11/2018    Speciality Comments: Reclast 2017-2022  Procedures:  No procedures performed Allergies: Amoxicillin, Doxycycline, Gabapentin, Levaquin [levofloxacin in d5w], Prednisone, Statins, Sucralfate, Sulfa antibiotics, Tramadol, Fenofibrate micronized, and Tizanidine   Assessment / Plan:     Visit Diagnoses: Sicca complex ([place]) - History of dry mouth and dry eyes for many years.  She was diagnosed with Sjogren's at [facility].  Patient declined autoimmune work-up when offered by Dr. Estanislado Pandy.  Sicca symptoms-unchanged.    Primary osteoarthritis of both shoulders: X-rays of the right shoulder were updated on 07/12/21. She has painful ROM and limited abduction of the right shoulder to about 60 degrees.  She experiences nocturnal pain if she is on her side and has significant discomfort and stiffness with range of motion.  She had a cortisone injection performed on 12/01/2021 which did not provide any pain relief. She is not having any discomfort in her left shoulder joint at this time.  She has full range of motion of the left shoulder on examination today. Offered referral to physical therapy but she declined at this time. She plans on taking tramadol 50 mg 1 tablet 3 times daily for pain relief. She was given a list of natural anti-inflammatories to try taking.  Status post total knee replacement, right - By Dr. Maureen Ralphs in 2014. Doing well overall.  She has good ROM with no discomfort on examination today. She uses a cane to assist with ambulation.  Discussed the importance of lower extremity muscle strengthening.  She declined a referral to physical therapy at this time.   Chronic pain of left knee -X-rays of the left knee on 09/05/2021 were consistent with moderate osteoarthritis and moderate chondromalacia patella.  She presents today with severe pain in the left knee joint.  She has difficulty climbing steps and rising from a seated position due  to the severity of pain.  She has been using a cane to assist with ambulation.  She has not had any recent falls.  Discussed the importance of lower extremity muscle strengthening and fall prevention.  She declined referral to physical therapy today.  She was encouraged to perform home exercises.  She has tried using Voltaren  gel as well as other over-the-counter topical agents with no pain relief.  She plans on starting to take tramadol 50 mg 1 tablet 3 times daily for pain relief.  She had a cortisone injection on 12/01/2021 which provided no pain relief.  On examination today she has painful range of motion with crepitus in the left knee.  She is currently awaiting approval for Visco gel injections for the left knee.  We will notify her once these injections have been approved.  In the meantime she can try taking natural anti-inflammatories as discussed in detail today in the office.  DDD (degenerative disc disease), lumbar - She gets cortisone injections at [facility].  She is also followed by Dr. Nelva Bush.  Neuropathy: Advised the patient to follow up with neurology for further evaluation and management.   Age-related osteoporosis without current pathological fracture - She was treated with Reclast from 2015 through 2022.  She is currently on drug holiday.  She is taking vitamin D 5000 units daily.   Fibromyalgia: She has generalized hyperalgesia and positive tender points on examination.  She continues to experience intermittent myalgias and muscle tenderness due to fibromyalgia.  She cannot tolerate gabapentin or Lyrica in the past.  Discussed the importance of regular exercise and good sleep hygiene.   Chronic pain syndrome: Dr. Nelva Bush prescribed a prescription for tramadol 50 mg 1 to 2 tablets by mouth 1-2 times per day for pain relief. She could not tolerate lyrica in the past.   Other medical conditions are listed as follows:   Chronic diastolic heart failure ([place])  Essential  hypertension: BP was 125/63 today in the office.   History of IBS  Vitamin B12 deficiency  Mild aortic stenosis  History of hyperlipidemia  History of hypothyroidism  Orders: No orders of the defined types were placed in this encounter.  No orders of the defined types were placed in this encounter.     Follow-Up Instructions: Return in about 6 months (around 06/21/2022) for Sicca symptoms , Osteoarthritis, DDD, Osteoporosis.   Ofilia Neas, PA-C  Note - This record has been created using Dragon software.  Chart creation errors have been sought, but may not always  have been located. Such creation errors do not reflect on  the standard of medical care.

## 2021-12-22 ENCOUNTER — Telehealth: Payer: Self-pay

## 2021-12-22 ENCOUNTER — Other Ambulatory Visit: Payer: Self-pay

## 2021-12-22 ENCOUNTER — Ambulatory Visit: Payer: PPO | Admitting: Physician Assistant

## 2021-12-22 ENCOUNTER — Encounter: Payer: Self-pay | Admitting: Physician Assistant

## 2021-12-22 VITALS — BP 125/63 | HR 70 | Resp 17 | Ht 62.0 in | Wt 154.0 lb

## 2021-12-22 DIAGNOSIS — M25562 Pain in left knee: Secondary | ICD-10-CM

## 2021-12-22 DIAGNOSIS — Z8639 Personal history of other endocrine, nutritional and metabolic disease: Secondary | ICD-10-CM

## 2021-12-22 DIAGNOSIS — I5032 Chronic diastolic (congestive) heart failure: Secondary | ICD-10-CM | POA: Diagnosis not present

## 2021-12-22 DIAGNOSIS — M5136 Other intervertebral disc degeneration, lumbar region: Secondary | ICD-10-CM | POA: Diagnosis not present

## 2021-12-22 DIAGNOSIS — M19011 Primary osteoarthritis, right shoulder: Secondary | ICD-10-CM

## 2021-12-22 DIAGNOSIS — M19012 Primary osteoarthritis, left shoulder: Secondary | ICD-10-CM

## 2021-12-22 DIAGNOSIS — Z8719 Personal history of other diseases of the digestive system: Secondary | ICD-10-CM | POA: Diagnosis not present

## 2021-12-22 DIAGNOSIS — I10 Essential (primary) hypertension: Secondary | ICD-10-CM

## 2021-12-22 DIAGNOSIS — G894 Chronic pain syndrome: Secondary | ICD-10-CM | POA: Diagnosis not present

## 2021-12-22 DIAGNOSIS — M3500 Sicca syndrome, unspecified: Secondary | ICD-10-CM

## 2021-12-22 DIAGNOSIS — M797 Fibromyalgia: Secondary | ICD-10-CM | POA: Diagnosis not present

## 2021-12-22 DIAGNOSIS — M810 Age-related osteoporosis without current pathological fracture: Secondary | ICD-10-CM | POA: Diagnosis not present

## 2021-12-22 DIAGNOSIS — G629 Polyneuropathy, unspecified: Secondary | ICD-10-CM | POA: Diagnosis not present

## 2021-12-22 DIAGNOSIS — G8929 Other chronic pain: Secondary | ICD-10-CM

## 2021-12-22 DIAGNOSIS — I350 Nonrheumatic aortic (valve) stenosis: Secondary | ICD-10-CM

## 2021-12-22 DIAGNOSIS — Z96651 Presence of right artificial knee joint: Secondary | ICD-10-CM

## 2021-12-22 DIAGNOSIS — E538 Deficiency of other specified B group vitamins: Secondary | ICD-10-CM

## 2021-12-22 NOTE — Telephone Encounter (Signed)
VOB submitted for Orthovisc, left knee. BV pending.

## 2021-12-25 NOTE — Telephone Encounter (Signed)
VOB submitted for Orthovisc, left knee. BV pending. - Jo Parrish 12/22/2021

## 2021-12-27 DIAGNOSIS — I10 Essential (primary) hypertension: Secondary | ICD-10-CM | POA: Diagnosis not present

## 2021-12-27 DIAGNOSIS — R197 Diarrhea, unspecified: Secondary | ICD-10-CM | POA: Diagnosis not present

## 2021-12-27 DIAGNOSIS — R109 Unspecified abdominal pain: Secondary | ICD-10-CM | POA: Diagnosis not present

## 2021-12-27 DIAGNOSIS — K589 Irritable bowel syndrome without diarrhea: Secondary | ICD-10-CM | POA: Diagnosis not present

## 2021-12-27 DIAGNOSIS — E538 Deficiency of other specified B group vitamins: Secondary | ICD-10-CM | POA: Diagnosis not present

## 2021-12-27 DIAGNOSIS — K219 Gastro-esophageal reflux disease without esophagitis: Secondary | ICD-10-CM | POA: Diagnosis not present

## 2021-12-29 ENCOUNTER — Telehealth: Payer: Self-pay

## 2021-12-29 DIAGNOSIS — R109 Unspecified abdominal pain: Secondary | ICD-10-CM | POA: Diagnosis not present

## 2021-12-29 DIAGNOSIS — R197 Diarrhea, unspecified: Secondary | ICD-10-CM | POA: Diagnosis not present

## 2021-12-29 NOTE — Telephone Encounter (Signed)
Please schedule visco knee injections  Approved for Orthovisc series, left knee Buy & Bill Deductible does not apply No PA required $25 Co-pay  Confirmed with Jacqualin Combes REF [phone]

## 2022-01-08 ENCOUNTER — Other Ambulatory Visit: Payer: Self-pay

## 2022-01-08 ENCOUNTER — Ambulatory Visit: Payer: PPO | Admitting: Physician Assistant

## 2022-01-08 DIAGNOSIS — M1712 Unilateral primary osteoarthritis, left knee: Secondary | ICD-10-CM | POA: Diagnosis not present

## 2022-01-08 MED ORDER — HYALURONAN 30 MG/2ML IX SOSY
30.0000 mg | PREFILLED_SYRINGE | INTRA_ARTICULAR | Status: AC | PRN
  Administered 2022-01-08: 30 mg via INTRA_ARTICULAR

## 2022-01-08 MED ORDER — LIDOCAINE HCL 1 % IJ SOLN
1.5000 mL | INTRAMUSCULAR | Status: AC | PRN
  Administered 2022-01-08: 1.5 mL

## 2022-01-08 NOTE — Progress Notes (Signed)
° °  Procedure Note  Patient: Jo Parrish             Date of Birth: 1926-01-16           MRN: [id number]             Visit Date: 01/08/2022  Procedures: Visit Diagnoses:  1. Primary osteoarthritis of left knee    Left knee Orthovisc injection #1  Large Joint Inj: L knee on 01/08/2022 11:57 AM Indications: pain Details: 25 G 1.5 in needle, medial approach  Arthrogram: No  Medications: 1.5 mL lidocaine 1 %; 30 mg Hyaluronan 30 MG/2ML Aspirate: 0 mL Outcome: tolerated well, no immediate complications Procedure, treatment alternatives, risks and benefits explained, specific risks discussed. Consent was given by the patient. Immediately prior to procedure a time out was called to verify the correct patient, procedure, equipment, support staff and site/side marked as required. Patient was prepped and draped in the usual sterile fashion.    Patient tolerated the procedure well.  Aftercare was discussed.  Hazel Sams, PA-C

## 2022-01-11 DIAGNOSIS — H04123 Dry eye syndrome of bilateral lacrimal glands: Secondary | ICD-10-CM | POA: Diagnosis not present

## 2022-01-11 DIAGNOSIS — H52203 Unspecified astigmatism, bilateral: Secondary | ICD-10-CM | POA: Diagnosis not present

## 2022-01-15 ENCOUNTER — Other Ambulatory Visit: Payer: Self-pay | Admitting: Internal Medicine

## 2022-01-15 ENCOUNTER — Ambulatory Visit: Payer: PPO | Admitting: Physician Assistant

## 2022-01-15 ENCOUNTER — Other Ambulatory Visit: Payer: Self-pay

## 2022-01-15 ENCOUNTER — Other Ambulatory Visit (HOSPITAL_COMMUNITY): Payer: Self-pay | Admitting: Internal Medicine

## 2022-01-15 DIAGNOSIS — K5904 Chronic idiopathic constipation: Secondary | ICD-10-CM

## 2022-01-15 DIAGNOSIS — K219 Gastro-esophageal reflux disease without esophagitis: Secondary | ICD-10-CM | POA: Diagnosis not present

## 2022-01-15 DIAGNOSIS — K5900 Constipation, unspecified: Secondary | ICD-10-CM | POA: Diagnosis not present

## 2022-01-15 DIAGNOSIS — M1712 Unilateral primary osteoarthritis, left knee: Secondary | ICD-10-CM

## 2022-01-15 DIAGNOSIS — R110 Nausea: Secondary | ICD-10-CM | POA: Diagnosis not present

## 2022-01-15 MED ORDER — HYALURONAN 30 MG/2ML IX SOSY
30.0000 mg | PREFILLED_SYRINGE | INTRA_ARTICULAR | Status: AC | PRN
  Administered 2022-01-15: 30 mg via INTRA_ARTICULAR

## 2022-01-15 MED ORDER — LIDOCAINE HCL 1 % IJ SOLN
1.5000 mL | INTRAMUSCULAR | Status: AC | PRN
  Administered 2022-01-15: 1.5 mL via INTRA_ARTICULAR

## 2022-01-15 NOTE — Progress Notes (Signed)
° °  Procedure Note  Patient: Jo Parrish             Date of Birth: November 24, 1926           MRN: [id number]             Visit Date: 01/15/2022  Procedures: Visit Diagnoses:  1. Primary osteoarthritis of left knee    Orthovisc #2 left knee, B/B Large Joint Inj: L knee on 01/15/2022 11:59 AM Indications: pain Details: 25 G 1.5 in needle, medial approach  Arthrogram: No  Medications: 1.5 mL lidocaine 1 %; 30 mg Hyaluronan 30 MG/2ML Aspirate: 0 mL Outcome: tolerated well, no immediate complications Procedure, treatment alternatives, risks and benefits explained, specific risks discussed. Consent was given by the patient. Immediately prior to procedure a time out was called to verify the correct patient, procedure, equipment, support staff and site/side marked as required. Patient was prepped and draped in the usual sterile fashion.     Patient tolerated the procedure well.  Aftercare was discussed.  Hazel Sams, PA-C

## 2022-01-16 DIAGNOSIS — I10 Essential (primary) hypertension: Secondary | ICD-10-CM | POA: Diagnosis not present

## 2022-01-16 DIAGNOSIS — E538 Deficiency of other specified B group vitamins: Secondary | ICD-10-CM | POA: Diagnosis not present

## 2022-01-16 DIAGNOSIS — K8681 Exocrine pancreatic insufficiency: Secondary | ICD-10-CM | POA: Diagnosis not present

## 2022-01-16 DIAGNOSIS — N3000 Acute cystitis without hematuria: Secondary | ICD-10-CM | POA: Diagnosis not present

## 2022-01-16 DIAGNOSIS — Z1331 Encounter for screening for depression: Secondary | ICD-10-CM | POA: Diagnosis not present

## 2022-01-16 DIAGNOSIS — Z0001 Encounter for general adult medical examination with abnormal findings: Secondary | ICD-10-CM | POA: Diagnosis not present

## 2022-01-16 DIAGNOSIS — K589 Irritable bowel syndrome without diarrhea: Secondary | ICD-10-CM | POA: Diagnosis not present

## 2022-01-16 DIAGNOSIS — R109 Unspecified abdominal pain: Secondary | ICD-10-CM | POA: Diagnosis not present

## 2022-01-16 DIAGNOSIS — E663 Overweight: Secondary | ICD-10-CM | POA: Diagnosis not present

## 2022-01-16 DIAGNOSIS — Z6826 Body mass index (BMI) 26.0-26.9, adult: Secondary | ICD-10-CM | POA: Diagnosis not present

## 2022-01-17 ENCOUNTER — Ambulatory Visit: Payer: PPO | Admitting: Rheumatology

## 2022-01-19 ENCOUNTER — Ambulatory Visit: Payer: PPO | Admitting: Physician Assistant

## 2022-01-22 ENCOUNTER — Ambulatory Visit: Payer: PPO | Admitting: Physician Assistant

## 2022-01-22 ENCOUNTER — Other Ambulatory Visit: Payer: Self-pay

## 2022-01-22 DIAGNOSIS — M1712 Unilateral primary osteoarthritis, left knee: Secondary | ICD-10-CM | POA: Diagnosis not present

## 2022-01-22 MED ORDER — LIDOCAINE HCL 1 % IJ SOLN
1.5000 mL | INTRAMUSCULAR | Status: AC | PRN
  Administered 2022-01-22: 1.5 mL

## 2022-01-22 MED ORDER — HYALURONAN 30 MG/2ML IX SOSY
30.0000 mg | PREFILLED_SYRINGE | INTRA_ARTICULAR | Status: AC | PRN
  Administered 2022-01-22: 30 mg via INTRA_ARTICULAR

## 2022-01-22 NOTE — Progress Notes (Signed)
° °  Procedure Note  Patient: Jo Parrish             Date of Birth: May 30, 1926           MRN: [id number]             Visit Date: 01/22/2022  Procedures: Visit Diagnoses:  1. Primary osteoarthritis of left knee    Orthovisc #3 Left knee joint injection  Large Joint Inj: L knee on 01/22/2022 11:20 AM Indications: pain Details: 25 G 1.5 in needle, medial approach  Arthrogram: No  Medications: 1.5 mL lidocaine 1 %; 30 mg Hyaluronan 30 MG/2ML Aspirate: 0 mL Outcome: tolerated well, no immediate complications Procedure, treatment alternatives, risks and benefits explained, specific risks discussed. Consent was given by the patient. Immediately prior to procedure a time out was called to verify the correct patient, procedure, equipment, support staff and site/side marked as required. Patient was prepped and draped in the usual sterile fashion.    Patient tolerated the procedure well.  Aftercare was discussed.  Hazel Sams, PA-C

## 2022-01-24 ENCOUNTER — Telehealth: Payer: Self-pay | Admitting: Rheumatology

## 2022-01-24 DIAGNOSIS — K3189 Other diseases of stomach and duodenum: Secondary | ICD-10-CM | POA: Diagnosis not present

## 2022-01-24 DIAGNOSIS — K5730 Diverticulosis of large intestine without perforation or abscess without bleeding: Secondary | ICD-10-CM | POA: Diagnosis not present

## 2022-01-24 DIAGNOSIS — I2510 Atherosclerotic heart disease of native coronary artery without angina pectoris: Secondary | ICD-10-CM | POA: Diagnosis not present

## 2022-01-24 DIAGNOSIS — K5900 Constipation, unspecified: Secondary | ICD-10-CM | POA: Diagnosis not present

## 2022-01-24 DIAGNOSIS — K449 Diaphragmatic hernia without obstruction or gangrene: Secondary | ICD-10-CM | POA: Diagnosis not present

## 2022-01-24 NOTE — Telephone Encounter (Signed)
Left message to advise patient Jo Parrish would recommend following back up with the orthopedic surgeon that performed her right knee replacement in the past.

## 2022-01-24 NOTE — Telephone Encounter (Signed)
I would recommend following back up with the orthopedic surgeon that performed her right knee replacement in the past.

## 2022-01-24 NOTE — Telephone Encounter (Signed)
Patient left a voicemail stating at her last Visco injection appointment Lovena Le had wanted to refer the her somewhere for her right knee. Patient states at the time she wasn't interested but has since changed her mind and would like to be referred. Patient states if anyone needs to reach her call her home phone because she is in and out of appointments today. Patient states it is ok to leave message if you cannot reach her. [phone]

## 2022-01-25 ENCOUNTER — Telehealth: Payer: Self-pay

## 2022-01-25 NOTE — Telephone Encounter (Signed)
Opened in error

## 2022-01-26 DIAGNOSIS — M79604 Pain in right leg: Secondary | ICD-10-CM | POA: Diagnosis not present

## 2022-01-26 DIAGNOSIS — M545 Low back pain, unspecified: Secondary | ICD-10-CM | POA: Diagnosis not present

## 2022-01-30 DIAGNOSIS — R110 Nausea: Secondary | ICD-10-CM | POA: Diagnosis not present

## 2022-01-30 DIAGNOSIS — K219 Gastro-esophageal reflux disease without esophagitis: Secondary | ICD-10-CM | POA: Diagnosis not present

## 2022-01-30 DIAGNOSIS — K5904 Chronic idiopathic constipation: Secondary | ICD-10-CM | POA: Diagnosis not present

## 2022-02-04 DIAGNOSIS — M79604 Pain in right leg: Secondary | ICD-10-CM | POA: Diagnosis not present

## 2022-02-07 DIAGNOSIS — R2241 Localized swelling, mass and lump, right lower limb: Secondary | ICD-10-CM | POA: Diagnosis not present

## 2022-02-08 DIAGNOSIS — G90529 Complex regional pain syndrome I of unspecified lower limb: Secondary | ICD-10-CM | POA: Diagnosis not present

## 2022-02-09 ENCOUNTER — Other Ambulatory Visit (HOSPITAL_COMMUNITY): Payer: Self-pay | Admitting: Physical Medicine and Rehabilitation

## 2022-02-09 ENCOUNTER — Other Ambulatory Visit: Payer: Self-pay | Admitting: Physical Medicine and Rehabilitation

## 2022-02-14 ENCOUNTER — Other Ambulatory Visit (HOSPITAL_COMMUNITY): Payer: Self-pay | Admitting: Physical Medicine and Rehabilitation

## 2022-02-14 ENCOUNTER — Other Ambulatory Visit: Payer: Self-pay | Admitting: Physical Medicine and Rehabilitation

## 2022-02-15 ENCOUNTER — Other Ambulatory Visit (HOSPITAL_COMMUNITY): Payer: Self-pay | Admitting: Physical Medicine and Rehabilitation

## 2022-02-15 DIAGNOSIS — M545 Low back pain, unspecified: Secondary | ICD-10-CM

## 2022-02-15 DIAGNOSIS — M79606 Pain in leg, unspecified: Secondary | ICD-10-CM

## 2022-02-21 ENCOUNTER — Other Ambulatory Visit: Payer: Self-pay

## 2022-02-21 ENCOUNTER — Encounter (HOSPITAL_COMMUNITY): Payer: Self-pay

## 2022-02-21 ENCOUNTER — Other Ambulatory Visit (HOSPITAL_COMMUNITY): Payer: PPO

## 2022-02-21 ENCOUNTER — Encounter (HOSPITAL_COMMUNITY)
Admission: RE | Admit: 2022-02-21 | Discharge: 2022-02-21 | Disposition: A | Discharge status: Home / Self Care | Payer: PPO | Source: Ambulatory Visit | Attending: Physical Medicine and Rehabilitation | Admitting: Physical Medicine and Rehabilitation

## 2022-02-21 DIAGNOSIS — M79606 Pain in leg, unspecified: Secondary | ICD-10-CM | POA: Diagnosis not present

## 2022-02-21 DIAGNOSIS — M545 Low back pain, unspecified: Secondary | ICD-10-CM | POA: Diagnosis not present

## 2022-02-21 DIAGNOSIS — M19071 Primary osteoarthritis, right ankle and foot: Secondary | ICD-10-CM | POA: Diagnosis not present

## 2022-02-21 DIAGNOSIS — D519 Vitamin B12 deficiency anemia, unspecified: Secondary | ICD-10-CM | POA: Diagnosis not present

## 2022-02-21 DIAGNOSIS — M19011 Primary osteoarthritis, right shoulder: Secondary | ICD-10-CM | POA: Diagnosis not present

## 2022-02-21 DIAGNOSIS — M170 Bilateral primary osteoarthritis of knee: Secondary | ICD-10-CM | POA: Diagnosis not present

## 2022-02-21 MED ORDER — TECHNETIUM TC 99M MEDRONATE IV KIT
20.0000 | PACK | Freq: Once | INTRAVENOUS | Status: AC | PRN
  Administered 2022-02-21: 20.5 via INTRAVENOUS

## 2022-02-28 ENCOUNTER — Ambulatory Visit: Payer: PPO | Admitting: Rheumatology

## 2022-03-01 DIAGNOSIS — M47816 Spondylosis without myelopathy or radiculopathy, lumbar region: Secondary | ICD-10-CM | POA: Diagnosis not present

## 2022-03-06 ENCOUNTER — Ambulatory Visit: Payer: PPO | Admitting: Rheumatology

## 2022-03-14 DIAGNOSIS — E663 Overweight: Secondary | ICD-10-CM | POA: Diagnosis not present

## 2022-03-14 DIAGNOSIS — A084 Viral intestinal infection, unspecified: Secondary | ICD-10-CM | POA: Diagnosis not present

## 2022-03-14 DIAGNOSIS — K8681 Exocrine pancreatic insufficiency: Secondary | ICD-10-CM | POA: Diagnosis not present

## 2022-03-14 DIAGNOSIS — I10 Essential (primary) hypertension: Secondary | ICD-10-CM | POA: Diagnosis not present

## 2022-03-14 DIAGNOSIS — Z6826 Body mass index (BMI) 26.0-26.9, adult: Secondary | ICD-10-CM | POA: Diagnosis not present

## 2022-03-14 DIAGNOSIS — D519 Vitamin B12 deficiency anemia, unspecified: Secondary | ICD-10-CM | POA: Diagnosis not present

## 2022-03-14 DIAGNOSIS — N390 Urinary tract infection, site not specified: Secondary | ICD-10-CM | POA: Diagnosis not present

## 2022-03-14 DIAGNOSIS — E063 Autoimmune thyroiditis: Secondary | ICD-10-CM | POA: Diagnosis not present

## 2022-03-14 DIAGNOSIS — R109 Unspecified abdominal pain: Secondary | ICD-10-CM | POA: Diagnosis not present

## 2022-03-22 DIAGNOSIS — N390 Urinary tract infection, site not specified: Secondary | ICD-10-CM | POA: Diagnosis not present

## 2022-04-05 DIAGNOSIS — M47816 Spondylosis without myelopathy or radiculopathy, lumbar region: Secondary | ICD-10-CM | POA: Diagnosis not present

## 2022-04-12 DIAGNOSIS — M5136 Other intervertebral disc degeneration, lumbar region: Secondary | ICD-10-CM | POA: Diagnosis not present

## 2022-04-12 DIAGNOSIS — M5416 Radiculopathy, lumbar region: Secondary | ICD-10-CM | POA: Diagnosis not present

## 2022-04-12 DIAGNOSIS — G90529 Complex regional pain syndrome I of unspecified lower limb: Secondary | ICD-10-CM | POA: Diagnosis not present

## 2022-04-20 ENCOUNTER — Ambulatory Visit (INDEPENDENT_AMBULATORY_CARE_PROVIDER_SITE_OTHER): Payer: PPO

## 2022-04-20 ENCOUNTER — Ambulatory Visit
Admission: EM | Admit: 2022-04-20 | Discharge: 2022-04-20 | Disposition: A | Discharge status: Home / Self Care | Payer: PPO | Attending: Student | Admitting: Student

## 2022-04-20 DIAGNOSIS — R6883 Chills (without fever): Secondary | ICD-10-CM | POA: Diagnosis not present

## 2022-04-20 DIAGNOSIS — R059 Cough, unspecified: Secondary | ICD-10-CM | POA: Diagnosis not present

## 2022-04-20 DIAGNOSIS — J069 Acute upper respiratory infection, unspecified: Secondary | ICD-10-CM

## 2022-04-20 DIAGNOSIS — R110 Nausea: Secondary | ICD-10-CM | POA: Diagnosis not present

## 2022-04-20 DIAGNOSIS — R0989 Other specified symptoms and signs involving the circulatory and respiratory systems: Secondary | ICD-10-CM | POA: Diagnosis not present

## 2022-04-20 MED ORDER — METHYLPREDNISOLONE SODIUM SUCC 125 MG IJ SOLR
40.0000 mg | Freq: Once | INTRAMUSCULAR | Status: AC
  Administered 2022-04-20: 40 mg via INTRAMUSCULAR

## 2022-04-20 MED ORDER — PREDNISONE 20 MG PO TABS: 20.0000 mg | ORAL_TABLET | Freq: Every day | ORAL | 0 refills | Status: AC

## 2022-04-20 NOTE — Discharge Instructions (Addendum)
-  Prednisone one pill daily x5 days. Take in morning if possible as it can cause energy ?-Follow-up if symptoms worsen instead of improve - shortness of breath, chest pain, dizziness, weakness  ?

## 2022-04-20 NOTE — ED Triage Notes (Signed)
Pt presents with cough and fever that began on Tuesday , also has nausea  ?

## 2022-04-20 NOTE — ED Provider Notes (Signed)
?[facility] ? ? ? ?CSN: [id number] ?Arrival date & time: 04/20/22  1652 ? ? ?  ? ?History   ?Chief Complaint ?Chief Complaint  ?Patient presents with  ? Cough  ? Fever  ? ? ?HPI ?Jo Parrish is a 88 y.o. female presenting with cough and chills for 3 days.  History noncontributory as below, denies history pulm ds.  Describes subjective chills, nonproductive cough, malaise, fatigue.  Denies dyspnea, chest pain, dizziness, weakness.  Has attempted over-the-counter Mucinex with some relief. ? ?HPI ? ?Past Medical History:  ?Diagnosis Date  ? Bacterial overgrowth syndrome   ? Small bowel  ? Complication of anesthesia   ? Constipation   ? Cough 09/13/2015  ? GERD (gastroesophageal reflux disease)   ? Hematuria 09/13/2015  ? Hyperlipidemia   ? Hypertension   ? Hypothyroidism   ? Left bundle branch block   ? Neuropathy   ? OSA (obstructive sleep apnea)   ? UNABLE TO TOLERATE  MASK  ? PONV (postoperative nausea and vomiting)   ? Rheumatoid arthritis(714.0)   ? Sjogren's syndrome ([place])   ? UI (urinary incontinence) 01/16/2016  ? Ulcer, esophagus   ? Urinary frequency 07/04/2015  ? UTI (lower urinary tract infection) 09/13/2015  ? Vaginal atrophy 08/02/2015  ? Vaginal burning 07/04/2015  ? Vaginal dryness 07/04/2015  ? Vulvar itching 08/15/2015  ? Wheezing on auscultation 09/13/2015  ? ? ?Patient Active Problem List  ? Diagnosis Date Noted  ? Mild aortic stenosis 12/12/2018  ? UI (urinary incontinence) 01/16/2016  ? UTI (lower urinary tract infection) 09/13/2015  ? Hematuria 09/13/2015  ? Cough 09/13/2015  ? Wheezing on auscultation 09/13/2015  ? Vulvar itching 08/15/2015  ? Vaginal atrophy 08/02/2015  ? Urinary frequency 07/04/2015  ? Vaginal burning 07/04/2015  ? Vaginal dryness 07/04/2015  ? Rash, legs 06/21/2015  ? Constipation 01/07/2014  ? Anemia 10/20/2013  ? Heme positive stool 10/20/2013  ? Abnormal LFTs 10/20/2013  ? Nausea alone 10/20/2013  ? Cellulitis of leg, right 10/16/2013  ? Unspecified  constipation 10/16/2013  ? Postoperative anemia due to acute blood loss 10/12/2013  ? Hyponatremia 10/12/2013  ? OA (osteoarthritis) of knee 10/09/2013  ? Heart palpitations 10/07/2013  ? Hypothyroidism 08/13/2013  ? Chronic diastolic heart failure ([place]) 08/06/2013  ? Essential hypertension 08/06/2013  ? Hyperlipidemia 08/06/2013  ? Knee pain 03/03/2013  ? ? ?Past Surgical History:  ?Procedure Laterality Date  ? APPENDECTOMY    ? COLONOSCOPY  June 2014  ? Dr. Earlean Shawl, multiple tubular adenomas removed, internal hemorrhoids. Next colonoscopy in 3 years.  ? DILATION AND CURETTAGE OF UTERUS    ? ESOPHAGOGASTRODUODENOSCOPY  06/07/2008  ?  Gastritis. No celiac sprue/Small hiatal hernia  ? ESOPHAGOGASTRODUODENOSCOPY  June 2014  ? Dr. Earlean Shawl, erosive esophagitis, grade B., large hiatal hernia.  ? Hydrogen breath test   05/17/2008  ? small bowel bacterial overgrowth/Hydrogen level rise consistent with small bowel bacterial  ? TONSILLECTOMY    ? TOTAL KNEE ARTHROPLASTY Right 10/09/2013  ? Procedure: RIGHT TOTAL KNEE ARTHROPLASTY;  Surgeon: Gearlean Alf, MD;  Location: [facility];  Service: Orthopedics;  Laterality: Right;  ? ? ?OB History   ? ? Gravida  ?1  ? Para  ?1  ? Term  ?1  ? Preterm  ?   ? AB  ?   ? Living  ?1  ?  ? ? SAB  ?   ? IAB  ?   ? Ectopic  ?   ? Multiple  ?   ?  Live Births  ?1  ?   ?  ?  ? ? ? ?Home Medications   ? ?Prior to Admission medications   ?Medication Sig Start Date End Date Taking? Authorizing Provider  ?predniSONE (DELTASONE) 20 MG tablet Take 1 tablet (20 mg total) by mouth daily for 5 days. 04/20/22 04/25/22 Yes Hazel Sams, PA-C  ?amLODipine (NORVASC) 5 MG tablet Take 1 tablet (5 mg total) by mouth 2 (two) times daily. 10/08/14   Lorretta Harp, MD  ?aspirin 81 MG tablet Take 81 mg every other day by mouth.    [provider]  ?atenolol (TENORMIN) 25 MG tablet Take 25 mg by mouth daily.     [provider]  ?BIOTIN PO Take by mouth daily.    [provider]   ?Cyanocobalamin (VITAMIN B-12 IJ) Inject as directed.    [provider]  ?irbesartan (AVAPRO) 300 MG tablet Take 300 mg by mouth every morning.  07/20/13   [provider]  ?levothyroxine (SYNTHROID) 88 MCG tablet Take 88 mcg by mouth daily. 02/21/20   [provider]  ?Magnesium 250 MG TABS Take by mouth daily.    [provider]  ?Multiple Vitamins-Minerals (ZINC PO) Take 50 mg by mouth daily.    [provider]  ?omeprazole (PRILOSEC) 40 MG capsule Take 40 mg by mouth every morning.  07/04/13   [provider]  ?polyethylene glycol powder (GLYCOLAX/MIRALAX) 17 GM/SCOOP powder Take 17 g by mouth daily.    [provider]  ?pregabalin (LYRICA) 25 MG capsule Take 1 capsule (25 mg total) by mouth every morning. ?Patient not taking: Reported on 12/22/2021 08/29/21   Alric Ran, MD  ?pregabalin (LYRICA) 50 MG capsule Take 1 capsule (50 mg total) by mouth 2 (two) times daily. ?Patient not taking: Reported on 12/22/2021 08/22/21 12/01/21  Alric Ran, MD  ?traMADol (ULTRAM) 50 MG tablet Take by mouth. Take 1-2 tablets by mouth 1-2 times a day as needed for pain    [provider]  ?VITAMIN D PO Take 5,000 mg by mouth daily.    [provider]  ? ? ?Family History ?Family History  ?Problem Relation Age of Onset  ? Stroke Mother   ? Heart disease Father   ? Liver disease Father   ? COPD Son   ? Sleep apnea Son   ? Stroke Maternal Grandmother   ? ? ?Social History ?Social History  ? ?Tobacco Use  ? Smoking status: Never  ? Smokeless tobacco: Never  ?Vaping Use  ? Vaping Use: Never used  ?Substance Use Topics  ? Alcohol use: No  ? Drug use: No  ? ? ? ?Allergies   ?Amoxicillin, Doxycycline, Gabapentin, Levaquin [levofloxacin in d5w], Statins, Sucralfate, Sulfa antibiotics, Tramadol, Fenofibrate micronized, and Tizanidine ? ? ?Review of Systems ?Review of Systems  ?Constitutional:  Negative for appetite change, chills and fever.  ?HENT:  Positive for  congestion. Negative for ear pain, rhinorrhea, sinus pressure, sinus pain and sore throat.   ?Eyes:  Negative for redness and visual disturbance.  ?Respiratory:  Positive for cough. Negative for chest tightness, shortness of breath and wheezing.   ?Cardiovascular:  Negative for chest pain and palpitations.  ?Gastrointestinal:  Negative for abdominal pain, constipation, diarrhea, nausea and vomiting.  ?Genitourinary:  Negative for dysuria, frequency and urgency.  ?Musculoskeletal:  Negative for myalgias.  ?Neurological:  Negative for dizziness, weakness and headaches.  ?Psychiatric/Behavioral:  Negative for confusion.   ?All other systems reviewed and are negative. ? ? ?  Physical Exam ?Triage Vital Signs ?ED Triage Vitals  ?Enc Vitals Group  ?   BP 04/20/22 1901 133/72  ?   Pulse Rate 04/20/22 1901 81  ?   Resp 04/20/22 1901 18  ?   Temp 04/20/22 1901 99 ?F (37.2 ?C)  ?   Temp src --   ?   SpO2 04/20/22 1901 92 %  ?   Weight --   ?   Height --   ?   Head Circumference --   ?   Peak Flow --   ?   Pain Score 04/20/22 1900 0  ?   Pain Loc --   ?   Pain Edu? --   ?   Excl. in [place]? --   ? ?No data found. ? ?Updated Vital Signs ?BP 133/72   Pulse 81   Temp 99 ?F (37.2 ?C)   Resp 18   SpO2 92%  ? ?Visual Acuity ?Right Eye Distance:   ?Left Eye Distance:   ?Bilateral Distance:   ? ?Right Eye Near:   ?Left Eye Near:    ?Bilateral Near:    ? ?Physical Exam ?Vitals reviewed.  ?Constitutional:   ?   General: She is not in acute distress. ?   Appearance: Normal appearance. She is not ill-appearing.  ?HENT:  ?   Head: Normocephalic and atraumatic.  ?   Right Ear: Tympanic membrane, ear canal and external ear normal. No tenderness. No middle ear effusion. There is no impacted cerumen. Tympanic membrane is not perforated, erythematous, retracted or bulging.  ?   Left Ear: Tympanic membrane, ear canal and external ear normal. No tenderness.  No middle ear effusion. There is no impacted cerumen. Tympanic membrane is not perforated,  erythematous, retracted or bulging.  ?   Nose: Nose normal. No congestion.  ?   Mouth/Throat:  ?   Mouth: Mucous membranes are moist.  ?   Pharynx: Uvula midline. No oropharyngeal exudate or posterior oropharyngeal erythem

## 2022-04-24 DIAGNOSIS — D519 Vitamin B12 deficiency anemia, unspecified: Secondary | ICD-10-CM | POA: Diagnosis not present

## 2022-04-24 DIAGNOSIS — E063 Autoimmune thyroiditis: Secondary | ICD-10-CM | POA: Diagnosis not present

## 2022-04-24 DIAGNOSIS — E538 Deficiency of other specified B group vitamins: Secondary | ICD-10-CM | POA: Diagnosis not present

## 2022-04-24 DIAGNOSIS — Z6825 Body mass index (BMI) 25.0-25.9, adult: Secondary | ICD-10-CM | POA: Diagnosis not present

## 2022-04-24 DIAGNOSIS — J45909 Unspecified asthma, uncomplicated: Secondary | ICD-10-CM | POA: Diagnosis not present

## 2022-04-24 DIAGNOSIS — K8681 Exocrine pancreatic insufficiency: Secondary | ICD-10-CM | POA: Diagnosis not present

## 2022-04-24 DIAGNOSIS — E663 Overweight: Secondary | ICD-10-CM | POA: Diagnosis not present

## 2022-04-24 DIAGNOSIS — I10 Essential (primary) hypertension: Secondary | ICD-10-CM | POA: Diagnosis not present

## 2022-04-24 DIAGNOSIS — J329 Chronic sinusitis, unspecified: Secondary | ICD-10-CM | POA: Diagnosis not present

## 2022-04-26 DIAGNOSIS — I10 Essential (primary) hypertension: Secondary | ICD-10-CM | POA: Diagnosis not present

## 2022-04-26 DIAGNOSIS — J45909 Unspecified asthma, uncomplicated: Secondary | ICD-10-CM | POA: Diagnosis not present

## 2022-04-26 DIAGNOSIS — D519 Vitamin B12 deficiency anemia, unspecified: Secondary | ICD-10-CM | POA: Diagnosis not present

## 2022-04-26 DIAGNOSIS — Z6825 Body mass index (BMI) 25.0-25.9, adult: Secondary | ICD-10-CM | POA: Diagnosis not present

## 2022-04-26 DIAGNOSIS — M1991 Primary osteoarthritis, unspecified site: Secondary | ICD-10-CM | POA: Diagnosis not present

## 2022-05-10 DIAGNOSIS — G90522 Complex regional pain syndrome I of left lower limb: Secondary | ICD-10-CM | POA: Diagnosis not present

## 2022-06-06 DIAGNOSIS — M19011 Primary osteoarthritis, right shoulder: Secondary | ICD-10-CM | POA: Diagnosis not present

## 2022-06-06 DIAGNOSIS — M79641 Pain in right hand: Secondary | ICD-10-CM | POA: Diagnosis not present

## 2022-06-06 DIAGNOSIS — M65341 Trigger finger, right ring finger: Secondary | ICD-10-CM | POA: Diagnosis not present

## 2022-06-06 DIAGNOSIS — M25511 Pain in right shoulder: Secondary | ICD-10-CM | POA: Diagnosis not present

## 2022-06-06 DIAGNOSIS — M65331 Trigger finger, right middle finger: Secondary | ICD-10-CM | POA: Diagnosis not present

## 2022-06-12 DIAGNOSIS — G90529 Complex regional pain syndrome I of unspecified lower limb: Secondary | ICD-10-CM | POA: Diagnosis not present

## 2022-06-12 DIAGNOSIS — M5136 Other intervertebral disc degeneration, lumbar region: Secondary | ICD-10-CM | POA: Diagnosis not present

## 2022-07-02 DIAGNOSIS — D519 Vitamin B12 deficiency anemia, unspecified: Secondary | ICD-10-CM | POA: Diagnosis not present

## 2022-07-04 DIAGNOSIS — M65341 Trigger finger, right ring finger: Secondary | ICD-10-CM | POA: Diagnosis not present

## 2022-07-04 DIAGNOSIS — M65331 Trigger finger, right middle finger: Secondary | ICD-10-CM | POA: Diagnosis not present

## 2022-07-04 DIAGNOSIS — M19011 Primary osteoarthritis, right shoulder: Secondary | ICD-10-CM | POA: Diagnosis not present

## 2022-07-12 DIAGNOSIS — G90521 Complex regional pain syndrome I of right lower limb: Secondary | ICD-10-CM | POA: Diagnosis not present

## 2022-07-18 NOTE — Progress Notes (Deleted)
Office Visit Note  Patient: Jo Parrish             Date of Birth: 11-07-1926           MRN: [id number]             PCP: Redmond School, MD Referring: Redmond School, MD Visit Date: 08/01/2022 Occupation: '@GUAROCC'$ @  Subjective:  No chief complaint on file.   History of Present Illness: Jo Parrish is a 88 y.o. female ***   Activities of Daily Living:  Patient reports morning stiffness for *** {minute/hour:19697}.   Patient {ACTIONS;DENIES/REPORTS:21021675::"Denies"} nocturnal pain.  Difficulty dressing/grooming: {ACTIONS;DENIES/REPORTS:21021675::"Denies"} Difficulty climbing stairs: {ACTIONS;DENIES/REPORTS:21021675::"Denies"} Difficulty getting out of chair: {ACTIONS;DENIES/REPORTS:21021675::"Denies"} Difficulty using hands for taps, buttons, cutlery, and/or writing: {ACTIONS;DENIES/REPORTS:21021675::"Denies"}  No Rheumatology ROS completed.   PMFS History:  Patient Active Problem List   Diagnosis Date Noted  . Mild aortic stenosis 12/12/2018  . UI (urinary incontinence) 01/16/2016  . UTI (lower urinary tract infection) 09/13/2015  . Hematuria 09/13/2015  . Cough 09/13/2015  . Wheezing on auscultation 09/13/2015  . Vulvar itching 08/15/2015  . Vaginal atrophy 08/02/2015  . Urinary frequency 07/04/2015  . Vaginal burning 07/04/2015  . Vaginal dryness 07/04/2015  . Rash, legs 06/21/2015  . Constipation 01/07/2014  . Anemia 10/20/2013  . Heme positive stool 10/20/2013  . Abnormal LFTs 10/20/2013  . Nausea alone 10/20/2013  . Cellulitis of leg, right 10/16/2013  . Unspecified constipation 10/16/2013  . Postoperative anemia due to acute blood loss 10/12/2013  . Hyponatremia 10/12/2013  . OA (osteoarthritis) of knee 10/09/2013  . Heart palpitations 10/07/2013  . Hypothyroidism 08/13/2013  . Chronic diastolic heart failure ([place]) 08/06/2013  . Essential hypertension 08/06/2013  . Hyperlipidemia 08/06/2013  . Knee pain 03/03/2013    Past Medical History:   Diagnosis Date  . Bacterial overgrowth syndrome    Small bowel  . Complication of anesthesia   . Constipation   . Cough 09/13/2015  . GERD (gastroesophageal reflux disease)   . Hematuria 09/13/2015  . Hyperlipidemia   . Hypertension   . Hypothyroidism   . Left bundle branch block   . Neuropathy   . OSA (obstructive sleep apnea)    UNABLE TO TOLERATE  MASK  . PONV (postoperative nausea and vomiting)   . Rheumatoid arthritis(714.0)   . Sjogren's syndrome ([place])   . UI (urinary incontinence) 01/16/2016  . Ulcer, esophagus   . Urinary frequency 07/04/2015  . UTI (lower urinary tract infection) 09/13/2015  . Vaginal atrophy 08/02/2015  . Vaginal burning 07/04/2015  . Vaginal dryness 07/04/2015  . Vulvar itching 08/15/2015  . Wheezing on auscultation 09/13/2015    Family History  Problem Relation Age of Onset  . Stroke Mother   . Heart disease Father   . Liver disease Father   . COPD Son   . Sleep apnea Son   . Stroke Maternal Grandmother    Past Surgical History:  Procedure Laterality Date  . APPENDECTOMY    . COLONOSCOPY  June 2014   Dr. Earlean Shawl, multiple tubular adenomas removed, internal hemorrhoids. Next colonoscopy in 3 years.  Marland Kitchen DILATION AND CURETTAGE OF UTERUS    . ESOPHAGOGASTRODUODENOSCOPY  06/07/2008    Gastritis. No celiac sprue/Small hiatal hernia  . ESOPHAGOGASTRODUODENOSCOPY  June 2014   Dr. Earlean Shawl, erosive esophagitis, grade B., large hiatal hernia.  . Hydrogen breath test   05/17/2008   small bowel bacterial overgrowth/Hydrogen level rise consistent with small bowel bacterial  . TONSILLECTOMY    . TOTAL  KNEE ARTHROPLASTY Right 10/09/2013   Procedure: RIGHT TOTAL KNEE ARTHROPLASTY;  Surgeon: Gearlean Alf, MD;  Location: [facility];  Service: Orthopedics;  Laterality: Right;   Social History   Social History Narrative   Lives alone   Right Handed   Drinks 1-2 cups caffeine daily   Immunization History  Administered Date(s) Administered  . Moderna  Sars-Covid-2 Vaccination 01/08/2020, 02/03/2020, 10/29/2020     Objective: Vital Signs: There were no vitals taken for this visit.   Physical Exam   Musculoskeletal Exam: ***  CDAI Exam: CDAI Score: -- Patient Global: --; Provider Global: -- Swollen: --; Tender: -- Joint Exam 08/01/2022   No joint exam has been documented for this visit   There is currently no information documented on the homunculus. Go to the Rheumatology activity and complete the homunculus joint exam.  Investigation: No additional findings.  Imaging: No results found.  Recent Labs: Lab Results  Component Value Date   WBC 5.5 06/29/2021   HGB 12.8 06/29/2021   PLT 206 06/29/2021   NA 132 (L) 06/29/2021   K 4.4 06/29/2021   CL 100 06/29/2021   CO2 26 06/29/2021   GLUCOSE 118 (H) 06/29/2021   BUN 11 06/29/2021   CREATININE 0.71 06/29/2021   BILITOT 0.6 06/29/2021   ALKPHOS 40 06/29/2021   AST 22 06/29/2021   ALT 20 06/29/2021   PROT 7.0 06/29/2021   ALBUMIN 4.2 06/29/2021   CALCIUM 8.8 (L) 06/29/2021   GFRAA 58 (L) 06/11/2018    Speciality Comments: [facility] 2017-2022  Procedures:  No procedures performed Allergies: Amoxicillin, Doxycycline, Gabapentin, Levaquin [levofloxacin in d5w], Statins, Sucralfate, Sulfa antibiotics, Tramadol, Fenofibrate micronized, and Tizanidine   Assessment / Plan:     Visit Diagnoses: No diagnosis found.  Orders: No orders of the defined types were placed in this encounter.  No orders of the defined types were placed in this encounter.   Face-to-face time spent with patient was *** minutes. Greater than 50% of time was spent in counseling and coordination of care.  Follow-Up Instructions: No follow-ups on file.   Earnestine Mealing, CMA  Note - This record has been created using Editor, commissioning.  Chart creation errors have been sought, but may not always  have been located. Such creation errors do not reflect on  the standard of medical care.

## 2022-07-24 DIAGNOSIS — M5416 Radiculopathy, lumbar region: Secondary | ICD-10-CM | POA: Diagnosis not present

## 2022-07-28 DIAGNOSIS — M5416 Radiculopathy, lumbar region: Secondary | ICD-10-CM | POA: Diagnosis not present

## 2022-08-01 ENCOUNTER — Ambulatory Visit: Payer: PPO | Attending: Rheumatology | Admitting: Rheumatology

## 2022-08-01 DIAGNOSIS — G629 Polyneuropathy, unspecified: Secondary | ICD-10-CM

## 2022-08-01 DIAGNOSIS — M19011 Primary osteoarthritis, right shoulder: Secondary | ICD-10-CM

## 2022-08-01 DIAGNOSIS — G894 Chronic pain syndrome: Secondary | ICD-10-CM

## 2022-08-01 DIAGNOSIS — I10 Essential (primary) hypertension: Secondary | ICD-10-CM

## 2022-08-01 DIAGNOSIS — M3500 Sicca syndrome, unspecified: Secondary | ICD-10-CM

## 2022-08-01 DIAGNOSIS — E538 Deficiency of other specified B group vitamins: Secondary | ICD-10-CM

## 2022-08-01 DIAGNOSIS — M5136 Other intervertebral disc degeneration, lumbar region: Secondary | ICD-10-CM

## 2022-08-01 DIAGNOSIS — M1712 Unilateral primary osteoarthritis, left knee: Secondary | ICD-10-CM

## 2022-08-01 DIAGNOSIS — I350 Nonrheumatic aortic (valve) stenosis: Secondary | ICD-10-CM

## 2022-08-01 DIAGNOSIS — Z96651 Presence of right artificial knee joint: Secondary | ICD-10-CM

## 2022-08-01 DIAGNOSIS — Z8719 Personal history of other diseases of the digestive system: Secondary | ICD-10-CM

## 2022-08-01 DIAGNOSIS — M810 Age-related osteoporosis without current pathological fracture: Secondary | ICD-10-CM

## 2022-08-01 DIAGNOSIS — Z8639 Personal history of other endocrine, nutritional and metabolic disease: Secondary | ICD-10-CM

## 2022-08-01 DIAGNOSIS — I5032 Chronic diastolic (congestive) heart failure: Secondary | ICD-10-CM

## 2022-08-01 DIAGNOSIS — M797 Fibromyalgia: Secondary | ICD-10-CM

## 2022-08-08 DIAGNOSIS — D519 Vitamin B12 deficiency anemia, unspecified: Secondary | ICD-10-CM | POA: Diagnosis not present

## 2022-08-13 DIAGNOSIS — M5136 Other intervertebral disc degeneration, lumbar region: Secondary | ICD-10-CM | POA: Diagnosis not present

## 2022-08-13 DIAGNOSIS — M47816 Spondylosis without myelopathy or radiculopathy, lumbar region: Secondary | ICD-10-CM | POA: Diagnosis not present

## 2022-08-13 DIAGNOSIS — G90529 Complex regional pain syndrome I of unspecified lower limb: Secondary | ICD-10-CM | POA: Diagnosis not present

## 2022-08-13 DIAGNOSIS — M5416 Radiculopathy, lumbar region: Secondary | ICD-10-CM | POA: Diagnosis not present

## 2022-08-27 ENCOUNTER — Telehealth: Payer: Self-pay | Admitting: Neurology

## 2022-08-27 NOTE — Telephone Encounter (Signed)
Pt is making a request to change providers from Dr April Manson to Dr Jaynee Eagles for her Neuropathy.

## 2022-08-29 NOTE — Telephone Encounter (Signed)
Pt was called and the message from Dr Jaynee Eagles was relayed, pt will keep her current appointment with Dr April Manson and will express her concerns re: medications she has tried during office visit.  This is FYI no call back requested.

## 2022-08-30 DIAGNOSIS — M65331 Trigger finger, right middle finger: Secondary | ICD-10-CM | POA: Diagnosis not present

## 2022-08-30 DIAGNOSIS — M19011 Primary osteoarthritis, right shoulder: Secondary | ICD-10-CM | POA: Diagnosis not present

## 2022-08-30 DIAGNOSIS — M65341 Trigger finger, right ring finger: Secondary | ICD-10-CM | POA: Diagnosis not present

## 2022-09-04 DIAGNOSIS — K8681 Exocrine pancreatic insufficiency: Secondary | ICD-10-CM | POA: Diagnosis not present

## 2022-09-04 DIAGNOSIS — M810 Age-related osteoporosis without current pathological fracture: Secondary | ICD-10-CM | POA: Diagnosis not present

## 2022-09-04 DIAGNOSIS — E063 Autoimmune thyroiditis: Secondary | ICD-10-CM | POA: Diagnosis not present

## 2022-09-04 DIAGNOSIS — Z23 Encounter for immunization: Secondary | ICD-10-CM | POA: Diagnosis not present

## 2022-09-04 DIAGNOSIS — M1991 Primary osteoarthritis, unspecified site: Secondary | ICD-10-CM | POA: Diagnosis not present

## 2022-09-04 DIAGNOSIS — Z6826 Body mass index (BMI) 26.0-26.9, adult: Secondary | ICD-10-CM | POA: Diagnosis not present

## 2022-09-04 DIAGNOSIS — I10 Essential (primary) hypertension: Secondary | ICD-10-CM | POA: Diagnosis not present

## 2022-09-04 DIAGNOSIS — I5032 Chronic diastolic (congestive) heart failure: Secondary | ICD-10-CM | POA: Diagnosis not present

## 2022-09-04 DIAGNOSIS — G894 Chronic pain syndrome: Secondary | ICD-10-CM | POA: Diagnosis not present

## 2022-09-04 DIAGNOSIS — D519 Vitamin B12 deficiency anemia, unspecified: Secondary | ICD-10-CM | POA: Diagnosis not present

## 2022-09-05 ENCOUNTER — Ambulatory Visit: Payer: PPO | Admitting: Neurology

## 2022-09-06 DIAGNOSIS — K5904 Chronic idiopathic constipation: Secondary | ICD-10-CM | POA: Diagnosis not present

## 2022-09-06 DIAGNOSIS — K5903 Drug induced constipation: Secondary | ICD-10-CM | POA: Diagnosis not present

## 2022-09-06 DIAGNOSIS — T402X5A Adverse effect of other opioids, initial encounter: Secondary | ICD-10-CM | POA: Diagnosis not present

## 2022-09-19 DIAGNOSIS — M5136 Other intervertebral disc degeneration, lumbar region: Secondary | ICD-10-CM | POA: Diagnosis not present

## 2022-09-21 ENCOUNTER — Ambulatory Visit: Payer: PPO | Attending: Cardiovascular Disease | Admitting: Cardiovascular Disease

## 2022-09-21 ENCOUNTER — Encounter: Payer: Self-pay | Admitting: Cardiovascular Disease

## 2022-09-21 DIAGNOSIS — I10 Essential (primary) hypertension: Secondary | ICD-10-CM | POA: Diagnosis not present

## 2022-09-21 DIAGNOSIS — E782 Mixed hyperlipidemia: Secondary | ICD-10-CM

## 2022-09-21 DIAGNOSIS — I350 Nonrheumatic aortic (valve) stenosis: Secondary | ICD-10-CM

## 2022-09-21 NOTE — Patient Instructions (Signed)
Medication Instructions:  Your physician recommends that you continue on your current medications as directed. Please refer to the Current Medication list given to you today.  *If you need a refill on your cardiac medications before your next appointment, please call your pharmacy*   Follow-Up: At [facility], you and your health needs are our priority.  As part of our continuing mission to provide you with exceptional heart care, we have created designated Provider Care Teams.  These Care Teams include your primary Cardiologist (physician) and Advanced Practice Providers (APPs -  Physician Assistants and Nurse Practitioners) who all work together to provide you with the care you need, when you need it.  We recommend signing up for the patient portal called "MyChart".  Sign up information is provided on this After Visit Summary.  MyChart is used to connect with patients for Virtual Visits (Telemedicine).  Patients are able to view lab/test results, encounter notes, upcoming appointments, etc.  Non-urgent messages can be sent to your provider as well.   To learn more about what you can do with MyChart, go to [email or web address].    Your next appointment:   12 month(s)  The format for your next appointment:   In Person  Provider:   Jonathan Berry, MD   

## 2022-09-21 NOTE — Assessment & Plan Note (Signed)
History of essential hypertension a blood pressure measured today at 132/62.  She is on amlodipine, atenolol and Avapro.

## 2022-09-21 NOTE — Assessment & Plan Note (Signed)
History of mild aortic stenosis with 2D echo performed 11/03/2018 revealing normal LV systolic function with a valve area of 1.91 cm and peak gradient of 17 mmHg.

## 2022-09-21 NOTE — Assessment & Plan Note (Signed)
History of hyperlipidemia not on statin therapy with lipid profile performed 01/17/2022 revealing total cholesterol 190, LDL 120 HDL 49.
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
## 2022-10-19 DIAGNOSIS — D519 Vitamin B12 deficiency anemia, unspecified: Secondary | ICD-10-CM | POA: Diagnosis not present

## 2022-10-29 DIAGNOSIS — K8681 Exocrine pancreatic insufficiency: Secondary | ICD-10-CM | POA: Diagnosis not present

## 2022-10-29 DIAGNOSIS — J329 Chronic sinusitis, unspecified: Secondary | ICD-10-CM | POA: Diagnosis not present

## 2022-10-29 DIAGNOSIS — E063 Autoimmune thyroiditis: Secondary | ICD-10-CM | POA: Diagnosis not present

## 2022-10-29 DIAGNOSIS — Z6826 Body mass index (BMI) 26.0-26.9, adult: Secondary | ICD-10-CM | POA: Diagnosis not present

## 2022-10-29 DIAGNOSIS — I5032 Chronic diastolic (congestive) heart failure: Secondary | ICD-10-CM | POA: Diagnosis not present

## 2022-10-29 DIAGNOSIS — M1991 Primary osteoarthritis, unspecified site: Secondary | ICD-10-CM | POA: Diagnosis not present

## 2022-10-29 DIAGNOSIS — I10 Essential (primary) hypertension: Secondary | ICD-10-CM | POA: Diagnosis not present

## 2022-10-29 DIAGNOSIS — M810 Age-related osteoporosis without current pathological fracture: Secondary | ICD-10-CM | POA: Diagnosis not present

## 2022-11-12 DIAGNOSIS — M65341 Trigger finger, right ring finger: Secondary | ICD-10-CM | POA: Diagnosis not present

## 2022-11-12 DIAGNOSIS — M5416 Radiculopathy, lumbar region: Secondary | ICD-10-CM | POA: Diagnosis not present

## 2022-11-12 DIAGNOSIS — M19011 Primary osteoarthritis, right shoulder: Secondary | ICD-10-CM | POA: Diagnosis not present

## 2022-11-12 DIAGNOSIS — M65331 Trigger finger, right middle finger: Secondary | ICD-10-CM | POA: Diagnosis not present

## 2022-11-12 DIAGNOSIS — G90529 Complex regional pain syndrome I of unspecified lower limb: Secondary | ICD-10-CM | POA: Diagnosis not present

## 2022-11-12 DIAGNOSIS — M5136 Other intervertebral disc degeneration, lumbar region: Secondary | ICD-10-CM | POA: Diagnosis not present

## 2022-11-20 ENCOUNTER — Encounter: Payer: Self-pay | Admitting: Neurology

## 2022-11-23 DIAGNOSIS — M545 Low back pain, unspecified: Secondary | ICD-10-CM | POA: Diagnosis not present

## 2022-11-28 DIAGNOSIS — M533 Sacrococcygeal disorders, not elsewhere classified: Secondary | ICD-10-CM | POA: Diagnosis not present

## 2022-12-02 DIAGNOSIS — M545 Low back pain, unspecified: Secondary | ICD-10-CM | POA: Diagnosis not present

## 2022-12-11 ENCOUNTER — Telehealth (HOSPITAL_COMMUNITY): Payer: Self-pay

## 2022-12-11 ENCOUNTER — Other Ambulatory Visit (HOSPITAL_COMMUNITY): Payer: Self-pay | Admitting: Neuroradiology

## 2022-12-11 DIAGNOSIS — S3210XA Unspecified fracture of sacrum, initial encounter for closed fracture: Secondary | ICD-10-CM

## 2022-12-11 NOTE — Telephone Encounter (Signed)
Ok per [place] for bilateral sacroplasty. AW

## 2022-12-12 ENCOUNTER — Other Ambulatory Visit: Payer: Self-pay | Admitting: Radiology

## 2022-12-12 DIAGNOSIS — M549 Dorsalgia, unspecified: Secondary | ICD-10-CM

## 2022-12-13 ENCOUNTER — Ambulatory Visit (HOSPITAL_COMMUNITY)
Admission: RE | Admit: 2022-12-13 | Discharge: 2022-12-13 | Disposition: A | Discharge status: Home / Self Care | Payer: PPO | Source: Ambulatory Visit | Attending: Neuroradiology | Admitting: Neuroradiology

## 2022-12-13 ENCOUNTER — Other Ambulatory Visit: Payer: Self-pay

## 2022-12-13 ENCOUNTER — Encounter (HOSPITAL_COMMUNITY): Payer: Self-pay

## 2022-12-13 DIAGNOSIS — M549 Dorsalgia, unspecified: Secondary | ICD-10-CM

## 2022-12-13 DIAGNOSIS — S3210XA Unspecified fracture of sacrum, initial encounter for closed fracture: Secondary | ICD-10-CM | POA: Insufficient documentation

## 2022-12-13 DIAGNOSIS — W19XXXA Unspecified fall, initial encounter: Secondary | ICD-10-CM | POA: Diagnosis not present

## 2022-12-13 DIAGNOSIS — M8448XA Pathological fracture, other site, initial encounter for fracture: Secondary | ICD-10-CM | POA: Diagnosis not present

## 2022-12-13 DIAGNOSIS — M4848XA Fatigue fracture of vertebra, sacral and sacrococcygeal region, initial encounter for fracture: Secondary | ICD-10-CM | POA: Diagnosis not present

## 2022-12-13 HISTORY — PX: IR SACROPLASTY BILATERAL: IMG5561

## 2022-12-13 LAB — CBC
HCT: 38.8 % (ref 36.0–46.0)
Hemoglobin: 13.3 g/dL (ref 12.0–15.0)
MCH: 33.6 pg (ref 26.0–34.0)
MCHC: 34.3 g/dL (ref 30.0–36.0)
MCV: 98 fL (ref 80.0–100.0)
Platelets: 222 10*3/uL (ref 150–400)
RBC: 3.96 MIL/uL (ref 3.87–5.11)
RDW: 12.3 % (ref 11.5–15.5)
WBC: 5.2 10*3/uL (ref 4.0–10.5)
nRBC: 0 % (ref 0.0–0.2)

## 2022-12-13 LAB — PROTIME-INR
INR: 0.9 (ref 0.8–1.2)
Prothrombin Time: 12.4 seconds (ref 11.4–15.2)

## 2022-12-13 LAB — BASIC METABOLIC PANEL
Anion gap: 8 (ref 5–15)
BUN: 19 mg/dL (ref 8–23)
CO2: 26 mmol/L (ref 22–32)
Calcium: 9.5 mg/dL (ref 8.9–10.3)
Chloride: 106 mmol/L (ref 98–111)
Creatinine, Ser: 0.89 mg/dL (ref 0.44–1.00)
GFR, Estimated: >60 mL/min (ref >60)
Glucose, Bld: 103 mg/dL — ABNORMAL HIGH (ref 70–99)
Potassium: 4 mmol/L (ref 3.5–5.1)
Sodium: 140 mmol/L (ref 135–145)

## 2022-12-13 MED ORDER — FENTANYL CITRATE (PF) 100 MCG/2ML IJ SOLN
INTRAMUSCULAR | Status: AC | PRN
  Administered 2022-12-13 (×2): 25 ug via INTRAVENOUS

## 2022-12-13 MED ORDER — VANCOMYCIN HCL IN DEXTROSE 1-5 GM/200ML-% IV SOLN
INTRAVENOUS | Status: AC
  Administered 2022-12-13: 1000 mg via INTRAVENOUS
  Filled 2022-12-13: qty 200

## 2022-12-13 MED ORDER — ACETAMINOPHEN 325 MG PO TABS: 650.0000 mg | ORAL_TABLET | Freq: Four times a day (QID) | ORAL | Status: DC | PRN

## 2022-12-13 MED ORDER — VANCOMYCIN HCL IN DEXTROSE 1-5 GM/200ML-% IV SOLN: 1000.0000 mg | Freq: Once | INTRAVENOUS | Status: DC

## 2022-12-13 MED ORDER — LIDOCAINE HCL (PF) 1 % IJ SOLN
INTRAMUSCULAR | Status: AC
  Filled 2022-12-13: qty 30

## 2022-12-13 MED ORDER — MIDAZOLAM HCL 2 MG/2ML IJ SOLN
INTRAMUSCULAR | Status: AC
  Filled 2022-12-13: qty 2

## 2022-12-13 MED ORDER — MIDAZOLAM HCL 2 MG/2ML IJ SOLN
INTRAMUSCULAR | Status: AC | PRN
  Administered 2022-12-13: .5 mg via INTRAVENOUS
  Administered 2022-12-13: 1 mg via INTRAVENOUS

## 2022-12-13 MED ORDER — BUPIVACAINE HCL (PF) 0.5 % IJ SOLN
INTRAMUSCULAR | Status: AC
  Filled 2022-12-13: qty 30

## 2022-12-13 MED ORDER — VANCOMYCIN HCL IN DEXTROSE 1-5 GM/200ML-% IV SOLN: 1000.0000 mg | INTRAVENOUS | Status: AC

## 2022-12-13 MED ORDER — VANCOMYCIN HCL IN DEXTROSE 1-5 GM/200ML-% IV SOLN
INTRAVENOUS | Status: AC
  Filled 2022-12-13: qty 200

## 2022-12-13 MED ORDER — LIDOCAINE HCL 1 % IJ SOLN
INTRAMUSCULAR | Status: AC
  Filled 2022-12-13: qty 20

## 2022-12-13 MED ORDER — FENTANYL CITRATE (PF) 100 MCG/2ML IJ SOLN
INTRAMUSCULAR | Status: AC
  Filled 2022-12-13: qty 2

## 2022-12-13 MED ORDER — SODIUM CHLORIDE 0.9 % IV SOLN: INTRAVENOUS | Status: DC

## 2022-12-13 NOTE — Procedures (Signed)
INTERVENTIONAL NEURORADIOLOGY BRIEF POSTPROCEDURE NOTE  FLUOROSCOPY GUIDED BILATERAL SACROPLASTY  Attending: Dr. Pedro Earls  Diagnosis: Bilateral sacral insufficiency fractures  Access site: Percutaneous  Anesthesia: Moderate sedation.  Medication used: 1.5 mg Versed IV; 50 mcg Fentanyl IV.  Complications: None.  Estimated blood loss: Negligible.  Specimen: 1 core biopsy sent for tissue exam.  Bilateral long axis approach performed for sacroplasty. A core bone biopsy sample was obtained while obtaining access on the right side.  The patient tolerated the procedure well without incident or complication and is in stable condition.

## 2022-12-13 NOTE — H&P (Signed)
Chief Complaint: Patient was seen in consultation today for severe back pain-- bilateral sacroplasty at the request of [place] D[place] Rodrigues,[place]  Referring Physician(s): [place]  Supervising Physician: Pedro Earls  Patient Status: [facility] - Out-pt  History of Present Illness: Jo Parrish is a 88 y.o. female    Pt fell onto back side 1 mo ago Has had continued pain since Hurts most to sit for any length She take Tramadol anyway daily and has had to up dose and add Tyelenol for this new pain  Imaging does reveal bilateral sacral edema per Dr Tennis Must Sindy Messing Sacral insufficiency  Approved for Bilateral Sacroplasty   Past Medical History:  Diagnosis Date   Bacterial overgrowth syndrome    Small bowel   Complication of anesthesia    Constipation    Cough 09/13/2015   GERD (gastroesophageal reflux disease)    Hematuria 09/13/2015   Hyperlipidemia    Hypertension    Hypothyroidism    Left bundle branch block    Neuropathy    OSA (obstructive sleep apnea)    UNABLE TO TOLERATE  MASK   PONV (postoperative nausea and vomiting)    Rheumatoid arthritis(714.0)    Sjogren's syndrome (HCC)    UI (urinary incontinence) 01/16/2016   Ulcer, esophagus    Urinary frequency 07/04/2015   UTI (lower urinary tract infection) 09/13/2015   Vaginal atrophy 08/02/2015   Vaginal burning 07/04/2015   Vaginal dryness 07/04/2015   Vulvar itching 08/15/2015   Wheezing on auscultation 09/13/2015    Past Surgical History:  Procedure Laterality Date   APPENDECTOMY     COLONOSCOPY  June 2014   Dr. Earlean Shawl, multiple tubular adenomas removed, internal hemorrhoids. Next colonoscopy in 3 years.   DILATION AND CURETTAGE OF UTERUS     ESOPHAGOGASTRODUODENOSCOPY  06/07/2008    Gastritis. No celiac sprue/Small hiatal hernia   ESOPHAGOGASTRODUODENOSCOPY  June 2014   Dr. Earlean Shawl, erosive esophagitis, grade B., large hiatal hernia.   Hydrogen breath  test   05/17/2008   small bowel bacterial overgrowth/Hydrogen level rise consistent with small bowel bacterial   TONSILLECTOMY     TOTAL KNEE ARTHROPLASTY Right 10/09/2013   Procedure: RIGHT TOTAL KNEE ARTHROPLASTY;  Surgeon: Gearlean Alf, MD;  Location: [facility];  Service: Orthopedics;  Laterality: Right;    Allergies: Dicyclomine, Amoxicillin, Doxycycline, Gabapentin, Levaquin [levofloxacin in d5w], Lyrica [pregabalin], Statins, Sucralfate, Sulfa antibiotics, Fenofibrate micronized, and Tizanidine  Medications: Prior to Admission medications   Medication Sig Start Date End Date Taking? Authorizing Provider  acetaminophen (TYLENOL) 500 MG tablet Take 1,000 mg by mouth every 6 (six) hours as needed for moderate pain.   Yes [provider]  amLODipine (NORVASC) 5 MG tablet Take 1 tablet (5 mg total) by mouth 2 (two) times daily. 10/08/14  Yes Lorretta Harp, MD  aspirin 81 MG tablet Take 81 mg by mouth daily.   Yes [provider]  atenolol (TENORMIN) 25 MG tablet Take 25 mg by mouth daily.    Yes [provider]  Biotin (BIOTIN 5000) 5 MG CAPS Take 5 mg by mouth daily.   Yes [provider]  cholecalciferol (VITAMIN D3) 25 MCG (1000 UNIT) tablet Take 1,000 Units by mouth daily.   Yes [provider]  Cyanocobalamin (VITAMIN B-12 IJ) Inject 1 Dose as directed every 30 (thirty) days.   Yes [provider]  cyanocobalamin (VITAMIN B12) 1000 MCG tablet Take 1,000 mcg by mouth daily.   Yes [provider]  ezetimibe (ZETIA) 10 MG tablet Take 10 mg by mouth at bedtime.   Yes [provider]  irbesartan (AVAPRO) 300 MG tablet Take 300 mg by mouth every morning.  07/20/13  Yes [provider]  levothyroxine (SYNTHROID) 88 MCG tablet Take 88 mcg by mouth daily. 02/21/20  Yes [provider]  pantoprazole (PROTONIX) 40 MG tablet Take 40 mg by mouth 2 (two) times daily.   Yes [provider]  Polyethyl  Glycol-Propyl Glycol (SYSTANE OP) Place 1 drop into both eyes daily as needed (dry eyes).   Yes [provider]  polyethylene glycol powder (GLYCOLAX/MIRALAX) 17 GM/SCOOP powder Take 17 g by mouth daily.   Yes [provider]  traMADol (ULTRAM) 50 MG tablet Take 50 mg by mouth daily.   Yes [provider]  fluticasone (FLONASE) 50 MCG/ACT nasal spray Place 2 sprays into both nostrils daily as needed for allergies.    [provider]     Family History  Problem Relation Age of Onset   Stroke Mother    Heart disease Father    Liver disease Father    COPD Son    Sleep apnea Son    Stroke Maternal Grandmother     Social History   Socioeconomic History   Marital status: Widowed    Spouse name: Not on file   Number of children: Not on file   Years of education: Not on file   Highest education level: Not on file  Occupational History   Occupation: [employer]  Tobacco Use   Smoking status: Never   Smokeless tobacco: Never  Vaping Use   Vaping Use: Never used  Substance and Sexual Activity   Alcohol use: No   Drug use: No   Sexual activity: Not Currently    Birth control/protection: Post-menopausal  Other Topics Concern   Not on file  Social History Narrative   Lives alone   Right Handed   Drinks 1-2 cups caffeine daily   Social Determinants of Health   Financial Resource Strain: Not on file  Food Insecurity: Not on file  Transportation Needs: Not on file  Physical Activity: Not on file  Stress: Not on file  Social Connections: Not on file    Review of Systems: A 12 point ROS discussed and pertinent positives are indicated in the HPI above.  All other systems are negative.  Review of Systems  Constitutional:  Positive for activity change. Negative for fatigue and fever.  Respiratory:  Negative for cough and shortness of breath.   Cardiovascular:  Negative for chest pain.  Gastrointestinal:  Negative for nausea.  Musculoskeletal:   Positive for back pain and gait problem.  Neurological:  Negative for weakness.  Psychiatric/Behavioral:  Negative for behavioral problems and confusion.     Vital Signs: BP (!) 152/75   Pulse 71   Temp (!) 97.4 F (36.3 C) (Temporal)   Resp 18   Ht '5\' 2"'$  (1.575 m)   Wt 150 lb (68 kg)   SpO2 98%   BMI 27.44 kg/m    Physical Exam Vitals reviewed.  HENT:     Mouth/Throat:     Mouth: Mucous membranes are moist.  Cardiovascular:     Rate and Rhythm: Normal rate and regular rhythm.     Heart sounds: Normal heart sounds.  Pulmonary:     Effort: Pulmonary effort is normal.     Breath sounds: Normal breath sounds.  Abdominal:     Palpations: Abdomen is  soft.  Musculoskeletal:        General: Normal range of motion.     Comments: Sacral pain  Skin:    General: Skin is warm.  Neurological:     Mental Status: She is alert and oriented to person, place, and time.  Psychiatric:        Behavior: Behavior normal.     Imaging: No results found.  Labs:  CBC: Recent Labs    12/13/22 0948  WBC 5.2  HGB 13.3  HCT 38.8  PLT 222    COAGS: Recent Labs    12/13/22 0948  INR 0.9    BMP: Recent Labs    12/13/22 0948  NA 140  K 4.0  CL 106  CO2 26  GLUCOSE 103*  BUN 19  CALCIUM 9.5  CREATININE 0.89  GFRNONAA >60    LIVER FUNCTION TESTS: No results for input(s): "BILITOT", "AST", "ALT", "ALKPHOS", "PROT", "ALBUMIN" in the last 8760 hours.  TUMOR MARKERS: No results for input(s): "AFPTM", "CEA", "CA199", "CHROMGRNA" in the last 8760 hours.  Assessment and Plan:  Severe sacral pain Bilateral Sacral edema/insufficiency per imaging Scheduled for Bilateral Sacroplasty Risks and benefits of Bilateral Sacroplasty were discussed with the patient including, but not limited to education regarding the natural healing process of compression fractures without intervention, bleeding, infection, cement migration which may cause spinal cord damage, paralysis, pulmonary  embolism or even death.  This interventional procedure involves the use of X-rays and because of the nature of the planned procedure, it is possible that we will have prolonged use of X-ray fluoroscopy.  Potential radiation risks to you include (but are not limited to) the following: - A slightly elevated risk for cancer  several years later in life. This risk is typically less than 0.5% percent. This risk is low in comparison to the normal incidence of human cancer, which is 33% for women and 50% for men according to the [place]. - Radiation induced injury can include skin redness, resembling a rash, tissue breakdown / ulcers and hair loss (which can be temporary or permanent).   The likelihood of either of these occurring depends on the difficulty of the procedure and whether you are sensitive to radiation due to previous procedures, disease, or genetic conditions.   IF your procedure requires a prolonged use of radiation, you will be notified and given written instructions for further action.  It is your responsibility to monitor the irradiated area for the 2 weeks following the procedure and to notify your physician if you are concerned that you have suffered a radiation induced injury.    All of the patient's questions were answered, patient is agreeable to proceed.  Consent signed and in chart  Thank you for this interesting consult.  I greatly enjoyed meeting DARCY BARBARA and look forward to participating in their care.  A copy of this report was sent to the requesting provider on this date.  Electronically Signed: Lavonia Drafts, PA-C 12/13/2022, 11:57 AM   I spent a total of  30 Minutes   in face to face in clinical consultation, greater than 50% of which was counseling/coordinating care for B SP

## 2022-12-14 LAB — SURGICAL PATHOLOGY

## 2022-12-19 DIAGNOSIS — D519 Vitamin B12 deficiency anemia, unspecified: Secondary | ICD-10-CM | POA: Diagnosis not present

## 2022-12-26 ENCOUNTER — Ambulatory Visit (INDEPENDENT_AMBULATORY_CARE_PROVIDER_SITE_OTHER): Payer: PPO | Admitting: Podiatry

## 2022-12-26 DIAGNOSIS — L600 Ingrowing nail: Secondary | ICD-10-CM | POA: Diagnosis not present

## 2022-12-26 DIAGNOSIS — I999 Unspecified disorder of circulatory system: Secondary | ICD-10-CM

## 2022-12-26 DIAGNOSIS — G629 Polyneuropathy, unspecified: Secondary | ICD-10-CM

## 2022-12-26 NOTE — Patient Instructions (Signed)

## 2022-12-26 NOTE — Progress Notes (Deleted)
Initial neurology clinic note  SERVICE DATE: 01/03/23  Reason for Evaluation: Consultation requested by Redmond School, MD for an opinion regarding burning pain in legs and hands. My final recommendations will be communicated back to the requesting physician by way of shared medical record or letter to requesting physician via [place] mail.  HPI: This is Ms. Jo Parrish, a 89 y.o. ***-handed female with a medical history of OA, HTN, hypothyroidism, OSA, ***autoimmune disease (?sjogrens vs RA)*** who presents to neurology clinic with the chief complaint of ***. The patient is accompanied by ***.  *** Has burning pain in legs since early 2022, then in hands by 07/2021 Endorses cramps and difficulty sleeping as pain is worse at night Has previously tried gabapentin but it gave her mouth sores On B12 1000 mcg daily***  Patient was previously seen by Dr. April Manson at [facility] (08/01/21) for her symptoms. She was prescribed Lyrica, diclofenac and lidocaine cream. Lyrica was causing insomnia?***  The patient has not*** had similar episodes of symptoms in the past. ***  Muscle bulk loss? *** Muscle pain? ***  Cramps/Twitching? *** Suggestion of myotonia/difficulty relaxing after contraction? ***  Fatigable weakness?*** Does strength improve after brief exercise?***  Able to brush hair/teeth without difficulty? *** Able to button shirts/use zips? *** Clumsiness/dropping grasped objects?*** Can you arise from squatted position easily? *** Able to get out of chair without using arms? *** Able to walk up steps easily? *** Use an assistive device to walk? *** Significant imbalance with walking? *** Falls?*** Any change in urine color, especially after exertion/physical activity? ***  The patient denies*** symptoms suggestive of oculobulbar weakness including diplopia, ptosis, dysphagia, poor saliva control, dysarthria/dysphonia, impaired mastication, facial weakness/droop.  There are no*** neuromuscular  respiratory weakness symptoms, particularly orthopnea>dyspnea.   Pseudobulbar affect is absent***.  The patient does not*** report symptoms referable to autonomic dysfunction including impaired sweating, heat or cold intolerance, excessive mucosal dryness, gastroparetic early satiety, postprandial abdominal bloating, constipation, bowel or bladder dyscontrol, erectile dysfunction*** or syncope/presyncope/orthostatic intolerance.  There are no*** complaints relating to other symptoms of small fiber modalities including paresthesia/pain.  The patient has not *** noticed any recent skin rashes nor does he*** report any constitutional symptoms like fever, night sweats, anorexia or unintentional weight loss.  EtOH use: ***  Restrictive diet? *** Family history of neuropathy/myopathy/NM disease?***  Previous labs, electrodiagnostics, and neuroimaging are summarized below, but pertinent findings include***  Any biopsy done? *** Current medications being tried for the patient's symptoms include ***  Prior medications that have been tried: ***   MEDICATIONS:  Outpatient Encounter Medications as of 01/03/2023  Medication Sig   acetaminophen (TYLENOL) 500 MG tablet Take 1,000 mg by mouth every 6 (six) hours as needed for moderate pain.   amLODipine (NORVASC) 5 MG tablet Take 1 tablet (5 mg total) by mouth 2 (two) times daily.   aspirin 81 MG tablet Take 81 mg by mouth daily.   atenolol (TENORMIN) 25 MG tablet Take 25 mg by mouth daily.    Biotin (BIOTIN 5000) 5 MG CAPS Take 5 mg by mouth daily.   cholecalciferol (VITAMIN D3) 25 MCG (1000 UNIT) tablet Take 1,000 Units by mouth daily.   Cyanocobalamin (VITAMIN B-12 IJ) Inject 1 Dose as directed every 30 (thirty) days.   cyanocobalamin (VITAMIN B12) 1000 MCG tablet Take 1,000 mcg by mouth daily.   ezetimibe (ZETIA) 10 MG tablet Take 10 mg by mouth at bedtime.   fluticasone (FLONASE) 50 MCG/ACT nasal spray Place 2 sprays  into both nostrils daily as  needed for allergies.   irbesartan (AVAPRO) 300 MG tablet Take 300 mg by mouth every morning.    levothyroxine (SYNTHROID) 88 MCG tablet Take 88 mcg by mouth daily.   pantoprazole (PROTONIX) 40 MG tablet Take 40 mg by mouth 2 (two) times daily.   Polyethyl Glycol-Propyl Glycol (SYSTANE OP) Place 1 drop into both eyes daily as needed (dry eyes).   polyethylene glycol powder (GLYCOLAX/MIRALAX) 17 GM/SCOOP powder Take 17 g by mouth daily.   traMADol (ULTRAM) 50 MG tablet Take 50 mg by mouth daily.   No facility-administered encounter medications on file as of 01/03/2023.    PAST MEDICAL HISTORY: Past Medical History:  Diagnosis Date   Bacterial overgrowth syndrome    Small bowel   Complication of anesthesia    Constipation    Cough 09/13/2015   GERD (gastroesophageal reflux disease)    Hematuria 09/13/2015   Hyperlipidemia    Hypertension    Hypothyroidism    Left bundle branch block    Neuropathy    OSA (obstructive sleep apnea)    UNABLE TO TOLERATE  MASK   PONV (postoperative nausea and vomiting)    Rheumatoid arthritis(714.0)    Sjogren's syndrome (HCC)    UI (urinary incontinence) 01/16/2016   Ulcer, esophagus    Urinary frequency 07/04/2015   UTI (lower urinary tract infection) 09/13/2015   Vaginal atrophy 08/02/2015   Vaginal burning 07/04/2015   Vaginal dryness 07/04/2015   Vulvar itching 08/15/2015   Wheezing on auscultation 09/13/2015    PAST SURGICAL HISTORY: Past Surgical History:  Procedure Laterality Date   APPENDECTOMY     COLONOSCOPY  June 2014   Dr. Earlean Shawl, multiple tubular adenomas removed, internal hemorrhoids. Next colonoscopy in 3 years.   DILATION AND CURETTAGE OF UTERUS     ESOPHAGOGASTRODUODENOSCOPY  06/07/2008    Gastritis. No celiac sprue/Small hiatal hernia   ESOPHAGOGASTRODUODENOSCOPY  June 2014   Dr. Earlean Shawl, erosive esophagitis, grade B., large hiatal hernia.   Hydrogen breath test   05/17/2008   small bowel bacterial overgrowth/Hydrogen  level rise consistent with small bowel bacterial   IR SACROPLASTY BILATERAL  12/13/2022   TONSILLECTOMY     TOTAL KNEE ARTHROPLASTY Right 10/09/2013   Procedure: RIGHT TOTAL KNEE ARTHROPLASTY;  Surgeon: Gearlean Alf, MD;  Location: [facility];  Service: Orthopedics;  Laterality: Right;    ALLERGIES: Allergies  Allergen Reactions   Dicyclomine Swelling    Patient reported tongue swelling when taking   Amoxicillin     Mouth sores   Doxycycline     Mouth sores   Gabapentin     Mouth sores   Levaquin [Levofloxacin In D5w]     Mouth sores   Lyrica [Pregabalin]     Mouth sores   Statins Other (See Comments)    Leg problems   Sucralfate     Sore mouth   Sulfa Antibiotics Other (See Comments)    Mouth sores   Fenofibrate Micronized Other (See Comments)    unknown   Tizanidine Other (See Comments)    Dry mouth, slurred speech    FAMILY HISTORY: Family History  Problem Relation Age of Onset   Stroke Mother    Heart disease Father    Liver disease Father    COPD Son    Sleep apnea Son    Stroke Maternal Grandmother     SOCIAL HISTORY: Social History   Tobacco Use   Smoking status: Never   Smokeless tobacco: Never  Vaping Use   Vaping Use: Never used  Substance Use Topics   Alcohol use: No   Drug use: No   Social History   Social History Narrative   Lives alone   Right Handed   Drinks 1-2 cups caffeine daily     OBJECTIVE: PHYSICAL EXAM: There were no vitals taken for this visit.  General:*** General appearance: Awake and alert. No distress. Cooperative with exam.  Skin: No obvious rash or jaundice. HEENT: Atraumatic. Anicteric. Lungs: Non-labored breathing on room air  Heart: Regular Abdomen: Soft, non tender. Extremities: No edema. No obvious deformity.  Musculoskeletal: No obvious joint swelling. Psych: Affect appropriate.  Neurological: Mental Status: Alert. Speech fluent. No pseudobulbar affect Cranial Nerves: CNII: No RAPD. Visual fields  grossly intact. CNIII, IV, VI: PERRL. No nystagmus. EOMI. CN V: Facial sensation intact bilaterally to fine touch. [place] clench strong. Jaw jerk***. CN VII: Facial muscles symmetric and strong. No ptosis at rest or after sustained upgaze***. CN VIII: Hearing grossly intact bilaterally. CN IX: No hypophonia. CN X: Palate elevates symmetrically. CN XI: Full strength shoulder shrug bilaterally. CN XII: Tongue protrusion full and midline. No atrophy or fasciculations. No significant dysarthria*** Motor: Tone is ***. *** fasciculations in *** extremities. *** atrophy. No grip or percussive myotonia.***  Individual muscle group testing (MRC grade out of 5):  Movement     Neck flexion ***    Neck extension ***     Right Left   Shoulder abduction *** ***   Shoulder adduction *** ***   Shoulder ext rotation *** ***   Shoulder int rotation *** ***   Elbow flexion *** ***   Elbow extension *** ***   Wrist extension *** ***   Wrist flexion *** ***   Finger abduction - FDI *** ***   Finger abduction - ADM *** ***   Finger extension *** ***   Finger distal flexion - 2/3 *** ***   Finger distal flexion - 4/5 *** ***   Thumb flexion - FPL *** ***   Thumb abduction - APB *** ***    Hip flexion *** ***   Hip extension *** ***   Hip adduction *** ***   Hip abduction *** ***   Knee extension *** ***   Knee flexion *** ***   Dorsiflexion *** ***   Plantarflexion *** ***   Inversion *** ***   Eversion *** ***   Great toe extension *** ***   Great toe flexion *** ***     Reflexes:  Right Left   Bicep *** ***   Tricep *** ***   BrRad *** ***   Knee *** ***   Ankle *** ***    Pathological Reflexes: Babinski: *** response bilaterally*** Hoffman: *** Troemner: *** Pectoral: *** Palmomental: *** Facial: *** Midline tap: *** Sensation: Pinprick: *** Vibration: *** Temperature: *** Proprioception: *** Coordination: Intact finger-to- nose-finger bilaterally. Romberg  negative.*** Gait: Able to rise from chair with arms crossed unassisted. Normal, narrow-based gait. Able to tandem walk. Able to walk on toes and heels.***  Lab and Test Review: Internal labs: BMP and CBC unremarkable (12/13/22)***  External labs: TSH (05/25/20): 2.770***  Imaging: ***  ASSESSMENT: KANWAL TRUSCOTT is a 89 y.o. female who presents for evaluation of ***. *** has a relevant medical history of ***. *** neurological examination is pertinent for ***. Available diagnostic data is significant for ***. This constellation of symptoms and objective data would most likely localize to ***. ***  PLAN: -Blood work: ***  B12, HbA1c, IFE ***  -Return to clinic ***  The impression above as well as the plan as outlined below were extensively discussed with the patient (in the company of ***) who voiced understanding. All questions were answered to their satisfaction.  The patient was counseled on pertinent fall precautions per the printed material provided today, and as noted under the "Patient Instructions" section below.***  When available, results of the above investigations and possible further recommendations will be communicated to the patient via telephone/MyChart. Patient to call office if not contacted after expected testing turnaround time.   Total time spent reviewing records, interview, history/exam, documentation, and coordination of care on day of encounter:  *** min   Thank you for allowing me to participate in patient's care.  If I can answer any additional questions, I would be pleased to do so.  Jo Levins, MD   CC: Redmond School, [place] [phone]  [place]: Referring provider: Redmond School, [place],  [place] [address]

## 2022-12-26 NOTE — Progress Notes (Signed)
Subjective:   Patient ID: Jo Parrish, female   DOB: 89 y.o.   MRN: [id number]   HPI Patient presents with caregiver with chronic ingrown toenails of the big toenails bilateral and thickness of the nailbeds 1-5 both feet that she states she has issues with.  Also has history of neuropathic changes and burning shooting in her feet and reduced circulation   ROS      Objective:  Physical Exam  Neurovascular status reduced both DP PT pulses and sharp dull vibratory with incurvation of the nailbeds hallux bilateral both medial and lateral borders with pain     Assessment:  3 different problems with chronic ingrown toenail deformity neuropathy and vascular disease     Plan:  H&P reviewed all 3 problems and she is good to see a neurologist for neuropathy but has tried gabapentin and Lyrica in the past and I am not optimistic they will be able to help her.  I did debride out the nail borders courtesy and this can be done as needed but I do not see permanent procedures at the current time.  Patient to be seen back all questions answered today and we did discuss permanent correction that we may need to be do depending on how long this is affected for

## 2023-01-03 ENCOUNTER — Ambulatory Visit: Payer: PPO | Admitting: Neurology

## 2023-01-07 DIAGNOSIS — G894 Chronic pain syndrome: Secondary | ICD-10-CM | POA: Diagnosis not present

## 2023-01-07 DIAGNOSIS — Z6827 Body mass index (BMI) 27.0-27.9, adult: Secondary | ICD-10-CM | POA: Diagnosis not present

## 2023-01-07 DIAGNOSIS — K8681 Exocrine pancreatic insufficiency: Secondary | ICD-10-CM | POA: Diagnosis not present

## 2023-01-07 DIAGNOSIS — D518 Other vitamin B12 deficiency anemias: Secondary | ICD-10-CM | POA: Diagnosis not present

## 2023-01-07 DIAGNOSIS — E039 Hypothyroidism, unspecified: Secondary | ICD-10-CM | POA: Diagnosis not present

## 2023-01-07 DIAGNOSIS — M1991 Primary osteoarthritis, unspecified site: Secondary | ICD-10-CM | POA: Diagnosis not present

## 2023-01-07 DIAGNOSIS — I5032 Chronic diastolic (congestive) heart failure: Secondary | ICD-10-CM | POA: Diagnosis not present

## 2023-01-07 DIAGNOSIS — Z0001 Encounter for general adult medical examination with abnormal findings: Secondary | ICD-10-CM | POA: Diagnosis not present

## 2023-01-07 DIAGNOSIS — E663 Overweight: Secondary | ICD-10-CM | POA: Diagnosis not present

## 2023-01-07 DIAGNOSIS — R7309 Other abnormal glucose: Secondary | ICD-10-CM | POA: Diagnosis not present

## 2023-01-07 DIAGNOSIS — Z1331 Encounter for screening for depression: Secondary | ICD-10-CM | POA: Diagnosis not present

## 2023-01-07 DIAGNOSIS — I10 Essential (primary) hypertension: Secondary | ICD-10-CM | POA: Diagnosis not present

## 2023-01-07 DIAGNOSIS — E559 Vitamin D deficiency, unspecified: Secondary | ICD-10-CM | POA: Diagnosis not present

## 2023-01-07 DIAGNOSIS — G4739 Other sleep apnea: Secondary | ICD-10-CM | POA: Diagnosis not present

## 2023-01-09 DIAGNOSIS — M8430XA Stress fracture, unspecified site, initial encounter for fracture: Secondary | ICD-10-CM | POA: Diagnosis not present

## 2023-01-09 DIAGNOSIS — M19011 Primary osteoarthritis, right shoulder: Secondary | ICD-10-CM | POA: Diagnosis not present

## 2023-01-09 DIAGNOSIS — M5136 Other intervertebral disc degeneration, lumbar region: Secondary | ICD-10-CM | POA: Diagnosis not present

## 2023-01-09 DIAGNOSIS — R52 Pain, unspecified: Secondary | ICD-10-CM | POA: Diagnosis not present

## 2023-01-09 DIAGNOSIS — M47816 Spondylosis without myelopathy or radiculopathy, lumbar region: Secondary | ICD-10-CM | POA: Diagnosis not present

## 2023-01-09 DIAGNOSIS — M65341 Trigger finger, right ring finger: Secondary | ICD-10-CM | POA: Diagnosis not present

## 2023-01-09 DIAGNOSIS — M5416 Radiculopathy, lumbar region: Secondary | ICD-10-CM | POA: Diagnosis not present

## 2023-01-09 DIAGNOSIS — M19019 Primary osteoarthritis, unspecified shoulder: Secondary | ICD-10-CM | POA: Diagnosis not present

## 2023-01-09 DIAGNOSIS — M65331 Trigger finger, right middle finger: Secondary | ICD-10-CM | POA: Diagnosis not present

## 2023-01-11 ENCOUNTER — Encounter: Payer: Self-pay | Admitting: Cardiovascular Disease

## 2023-01-11 ENCOUNTER — Ambulatory Visit: Payer: PPO | Attending: Cardiovascular Disease | Admitting: Cardiovascular Disease

## 2023-01-11 VITALS — BP 134/62 | HR 66 | Ht 62.0 in | Wt 154.2 lb

## 2023-01-11 DIAGNOSIS — E782 Mixed hyperlipidemia: Secondary | ICD-10-CM | POA: Diagnosis not present

## 2023-01-11 DIAGNOSIS — I5032 Chronic diastolic (congestive) heart failure: Secondary | ICD-10-CM

## 2023-01-11 DIAGNOSIS — I10 Essential (primary) hypertension: Secondary | ICD-10-CM | POA: Diagnosis not present

## 2023-01-11 NOTE — Patient Instructions (Signed)
Medication Instructions:  Your physician recommends that you continue on your current medications as directed. Please refer to the Current Medication list given to you today.  *If you need a refill on your cardiac medications before your next appointment, please call your pharmacy*   Follow-Up: At [facility], you and your health needs are our priority.  As part of our continuing mission to provide you with exceptional heart care, we have created designated Provider Care Teams.  These Care Teams include your primary Cardiologist (physician) and Advanced Practice Providers (APPs -  Physician Assistants and Nurse Practitioners) who all work together to provide you with the care you need, when you need it.  We recommend signing up for the patient portal called "MyChart".  Sign up information is provided on this After Visit Summary.  MyChart is used to connect with patients for Virtual Visits (Telemedicine).  Patients are able to view lab/test results, encounter notes, upcoming appointments, etc.  Non-urgent messages can be sent to your provider as well.   To learn more about what you can do with MyChart, go to [email or web address].    Your next appointment:   6 month(s)  Provider:   Angela Duke, PA-C, Callie Goodrich, PA-C, Jennifer, Lambert, PA-C, Kathryn Lawrence, DNP, ANP, Hao Meng, PA-C, or Emily Monge, NP       Then, Jonathan Berry, MD will plan to see you again in 12 month(s).   

## 2023-01-11 NOTE — Assessment & Plan Note (Signed)
Chronic diastolic heart failure with 2D echo performed 11/03/2018 revealing normal LV systolic function with grade 1 diastolic dysfunction.  There is mild to moderate AI and mild aortic stenosis with valve area of 1.9 cm.
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
## 2023-01-11 NOTE — Assessment & Plan Note (Signed)
History of hyperlipidemia only on Zetia with lipid profile performed 01/07/2023 revealing total cholesterol 246, LDL 163 and HDL of 55.  She does not want to be on a statin drug.  Given her age.  I do not feel compelled to pursue other pharmacologic therapy.

## 2023-01-11 NOTE — Assessment & Plan Note (Signed)
History of essential hypertension a blood pressure measured today at 134/62.  She is on amlodipine, atenolol and Avapro.

## 2023-01-25 ENCOUNTER — Telehealth: Payer: Self-pay | Admitting: Cardiovascular Disease

## 2023-01-25 MED ORDER — HYDRALAZINE HCL 25 MG PO TABS: 25.0000 mg | ORAL_TABLET | Freq: Three times a day (TID) | ORAL | 3 refills | Status: DC

## 2023-01-25 NOTE — Telephone Encounter (Signed)
Pt wanted another call back. She stated her BP was now 163/83, P 78. [name] advised on earlier note as well as this. Order for hydralazine 25 mg three times daily, pt to keep BP/P diary, and appointment in 3 weeks with APP. Medication explained to pt. She will keep diary. Appointment for 3/8 with Janan Ridge. Med order placed.

## 2023-01-25 NOTE — Telephone Encounter (Addendum)
Pt stated that she felt "bad" yesterday. Generalized weakness, where she nearly fell forward, but her friend caught her. Her vision was blurry and she felt fatigued. At 6 PM, her BP 179/90. She took her evening meds and diazepam. Afterward, BP ranged from 142/77 to 158/85. This AM, 153/78, P 92. Asymptomatic this morning. While on phone, 147/83, 84. Taking meds as prescribed. Please advise on BP.

## 2023-01-25 NOTE — Addendum Note (Signed)
Addended by: Betha Loa F on: 01/25/2023 12:48 PM   Modules accepted: Orders

## 2023-01-25 NOTE — Telephone Encounter (Signed)
Pt c/o BP issue: STAT if pt c/o blurred vision, one-sided weakness or slurred speech  1. What are your last 5 BP readings?   02/08 -  6 P.M. - 0179/90 After meds 8 P.M. - 156/85 9:00 P.M. - 143/84 10:00 P.M.- 150/83 10:20 P.M.- 142/77 10:40 P.M.- 158/85  02/09 - 8 A.M. - 153/78 HR 92  2. Are you having any other symptoms (ex. Dizziness, headache, blurred vision, passed out)? Yesterday pt had some blurred vision and felt dizzy.   3. What is your BP issue? Patient states that yesterday she felt off with dizziness and had some blurred vision. Also states that she felt like she might "fall out" at some point yesterday and had a friend there to help her. She says she feels better today, but believes something may be off still and BP may still be spiking. Requesting call back to discuss possible med change.

## 2023-01-26 ENCOUNTER — Telehealth: Payer: Self-pay | Admitting: Cardiology

## 2023-01-26 NOTE — Telephone Encounter (Signed)
Patient  called in stating that her BP is too high.  She tells me that she felt bad on 2/8 and took her BP and it was 0000000 systolic.  She started keeping a log checking it multiple times over multiple hours.  She did not take any medication and called [name] yesterday day am.  She was started on hydralazine 11m TID.  She checked her BP yesterday afternoon before taking it and it was 135/664mg and she did not start taking the Hydralazine.  She continued to check her BP hourly last night and never got above 140 so she did not take it.  This am it was 128/7754m so she did not take it.  At lunch it was 145/51m55m  This evening it was 179/95mm50mnd she took her normal nightly Amlodipine 5mg a70mpm.  She also took 5mg di8mpam and ate a beet.  Now at 8:20 her BP came down to 156/83mmHg.48mI instructed her to check her BP in 2 hours and if SBP>140mmHg t77mtake Hydralazine 25mg.  Go34mforward I instructed her to take her Hydralazine 25mg TID a81mstructed by Dr. Berry and kGwenlyn Found log of her BPs and call back tomorrow if BP drops on the Hydralazine or if she is not sure if she should take the hydralazine based on her BP.    SHe is very nervous about starting Hydralazine and the effects it may have on her BP

## 2023-01-28 ENCOUNTER — Telehealth: Payer: Self-pay | Admitting: Cardiovascular Disease

## 2023-01-28 NOTE — Telephone Encounter (Signed)
Pt c/o BP issue: STAT if pt c/o blurred vision, one-sided weakness or slurred speech  1. What are your last 5 BP readings?  01/27/23 8:00 am 156/86 10:00 am 126/71 12:00 147/76 4:00 pm 159/86 9:20 pm 147/79 11:30 144/77  01/28/23 8:00 am 147/74 9:30 am 119/65 11:45 am 134/75  2. Are you having any other symptoms (ex. Dizziness, headache, blurred vision, passed out)? Mouth is sore, worsened blurred vision that comes & goes, and extremely fatigued   3. What is your BP issue? Concerned with BP readings fluctuating   Reports her mouth being sore is a common reaction to medications. She believes this symptom is being caused by the hydralazine that was recently prescribed.

## 2023-01-28 NOTE — Telephone Encounter (Signed)
Called patient, she has continued to have ongoing issues with her blood pressure- she recently spoke with Dr.Turner (message in epic, sent to Dr.Berry to review) patient states she wants to only see Dr.Berry, he is the only one that can fix her blood pressure- she states the  new medicine is causing her issues with her mouth, she states it causes numbness (I was not aware of this side effect) however, she states anytime she has a new medicine this causes her issues. She states she would like to have this fixed. Her appointment with Dr.Berry on 2/21 was cancelled and she wanted it back. I was unable to find that opening again,however scheduled for a different time  Patient is not having any symptoms other than states she does not feel well, she wants to see Dr.Berry- I offered other options but she does not want to see any APP. I advised I would send a message over to Dr.Berry to review and call back with any recommendations.   02/21 appointment made.   Thanks!

## 2023-01-29 ENCOUNTER — Telehealth: Payer: Self-pay | Admitting: Student

## 2023-01-29 NOTE — Telephone Encounter (Signed)
Telephone encounter:  Patient called with BP elevation to 171/92 earlier today. She was advised to take hydralazine 25 mg x1 and repeat BP in hour. If BP gradually went down, then reassured, but if BP still elevated, she was advised to go to the ER.  At this time, pt says that her BP is currently 130/78. Pt reports no dizziness, syncope or presyncope. Hydralazine is currently prescribed as 25 mg PO TID (8am, 4pm, midnight). Advised patient that given her normal BP at this time, she does not need to take another midnight dose of hydralazine. She is concerned about her labile pressures which she was advised to speak to her ambulatory provider about.  Loel Dubonnet MD MPH [facility]

## 2023-01-29 NOTE — Telephone Encounter (Signed)
Patient c/o BP elevated 171/92, AP 93 this evening. She felt that she has bladder/kidney infection, can't void much. She wants to know what to do. She has no chest pain, SOB. Advised patient to take hydralazine 10m x1, repeat BP in an hour, if gradually go down, it's good, if not call back; advised she go to the ER if she has s/s of HTN emergency. She then c.o feeling cold for a few days, blurry vision at times. As she has numerous complaints, advised the patient go to ER for in person evaluation.

## 2023-01-30 NOTE — Telephone Encounter (Signed)
Patient called as she is still worried about the fluctuations in her BP. She was scheduled for [name] on 2/21 but was able to move her to 2/15 @ 9am.   Patient is appreciative for the updated.  Made aware when to call 911 and take her medication as scheduled.  No additional questions at this time.

## 2023-01-30 NOTE — Telephone Encounter (Signed)
Patient is following up, stating that her BP at 12:00 AM was 139/78 78 and this morning at 8:00 AM was 148/76 95 and she feels a little anxious, but does not have any symptoms. She assumes the medication is not working and she would like a call back to discuss.

## 2023-01-31 ENCOUNTER — Ambulatory Visit: Payer: PPO | Attending: Cardiovascular Disease | Admitting: Cardiovascular Disease

## 2023-01-31 ENCOUNTER — Encounter: Payer: Self-pay | Admitting: Cardiovascular Disease

## 2023-01-31 VITALS — BP 122/60 | HR 65 | Ht 62.0 in | Wt 150.4 lb

## 2023-01-31 DIAGNOSIS — I10 Essential (primary) hypertension: Secondary | ICD-10-CM

## 2023-01-31 NOTE — Patient Instructions (Signed)
Medication Instructions:  Your physician has recommended you make the following change in your medication:   -Stop hydralazine (apresoline).   *If you need a refill on your cardiac medications before your next appointment, please call your pharmacy*   Follow-Up: At [facility], you and your health needs are our priority.  As part of our continuing mission to provide you with exceptional heart care, we have created designated Provider Care Teams.  These Care Teams include your primary Cardiologist (physician) and Advanced Practice Providers (APPs -  Physician Assistants and Nurse Practitioners) who all work together to provide you with the care you need, when you need it.  We recommend signing up for the patient portal called "MyChart".  Sign up information is provided on this After Visit Summary.  MyChart is used to connect with patients for Virtual Visits (Telemedicine).  Patients are able to view lab/test results, encounter notes, upcoming appointments, etc.  Non-urgent messages can be sent to your provider as well.   To learn more about what you can do with MyChart, go to [email or web address].    Your next appointment:   Monday, March 4th at 8:50am.  Provider:   Almyra Deforest, PA-C       Other Instructions [name] recommends taking your blood pressure once a day. Please keep a blood pressure log and bring with you to your next office visit.

## 2023-01-31 NOTE — Progress Notes (Signed)
Jo Parrish returns today for follow-up of hypertension.  She really denies any other symptoms.  She has had some random blood pressure readings which have been elevated although during these times she was asymptomatic.  I did prescribe hydralazine which she did not tolerate.  She is on amlodipine, atenolol and Avapro which I told her to continue.  I told her to keep a 30-day blood pressure log.  She has an appointment already made with Almyra Deforest  PA-C in several weeks to review.  Her blood pressure today was 122/60.  Lorretta Harp, M.D., [place], [facility], [place] [address] [place], [place]  [id number]  [phone] 01/31/2023 9:23 AM

## 2023-02-06 ENCOUNTER — Ambulatory Visit: Payer: PPO | Admitting: Cardiovascular Disease

## 2023-02-08 DIAGNOSIS — H31001 Unspecified chorioretinal scars, right eye: Secondary | ICD-10-CM | POA: Diagnosis not present

## 2023-02-08 DIAGNOSIS — H52203 Unspecified astigmatism, bilateral: Secondary | ICD-10-CM | POA: Diagnosis not present

## 2023-02-08 DIAGNOSIS — H5310 Unspecified subjective visual disturbances: Secondary | ICD-10-CM | POA: Diagnosis not present

## 2023-02-08 DIAGNOSIS — H43813 Vitreous degeneration, bilateral: Secondary | ICD-10-CM | POA: Diagnosis not present

## 2023-02-16 NOTE — Progress Notes (Signed)
 " Cardiology Office Note:    Date:  02/18/2023   ID:  Jo Parrish, DOB [birth year], MRN [id number]  PCP:  Bertell Satterfield, MD   [facility] Providers Cardiologist:  Dorn Lesches, MD     Referring MD: Bertell Satterfield, MD   CC: follow up for HTN; seen for Dr. Lesches   History of Present Illness:    Jo Parrish is a 89 y.o. female with a hx of hypertension, chronic diastolic heart failure, mild > moderate aortic stenosis, hypothyroidism, hyperlipidemia, palpitations, intermittent left bundle branch block.  Jo Parrish is a longstanding patient of Dr. Lesches for management of her hypertension.  She had a myocardial perfusion study on 11/20/2017 which revealed a known defect of moderate severity in the mid anteroseptal location, consistent with previous Myoview  2012, overall normal study.  Her most recent echo on 11/03/2018 revealed an EF of 55 to 60%, mild to moderate LVH, grade 1 DD, mild to moderate AR.  Most recently she was evaluated on 01/31/2023 with Dr. Lesches for elevated blood pressure readings.  At this visit her hydralazine  was stopped as she did not tolerate this.   She presents today accompanied by her friend, for follow up of her blood pressure. Over the last few months, she has noticed her blood pressure has continued to increase. We reviewed recent changes in her diet/lifestyle, she endorses that her son has advanced heart failure with an EF on 20%. This is her only child, and he typically calls her twice/day, she reports just once she wished that he felt good when he called her. She presents her BP log, her am readings before she takes her medications are typically 150's systolic. She then checks her BP two hours after her am medications, her systolic is usually high 130's- 150's. She has also had issues with back pain and has been more sedentary than normal due to this. She endorses fatigue that has been persistent for some time, she recently stopped her B12 injections and  supplements after recent lab work showed her B12 level > 2000. She denies chest pain, palpitations, dyspnea, pnd, orthopnea, n, v, dizziness, syncope, edema, weight gain, or early satiety.  Past Medical History:  Diagnosis Date   Bacterial overgrowth syndrome    Small bowel   Complication of anesthesia    Constipation    Cough 09/13/2015   GERD (gastroesophageal reflux disease)    Hematuria 09/13/2015   Hyperlipidemia    Hypertension    Hypothyroidism    Left bundle branch block    Neuropathy    OSA (obstructive sleep apnea)    UNABLE TO TOLERATE  MASK   PONV (postoperative nausea and vomiting)    Rheumatoid arthritis(714.0)    Sjogren's syndrome (HCC)    UI (urinary incontinence) 01/16/2016   Ulcer, esophagus    Urinary frequency 07/04/2015   UTI (lower urinary tract infection) 09/13/2015   Vaginal atrophy 08/02/2015   Vaginal burning 07/04/2015   Vaginal dryness 07/04/2015   Vulvar itching 08/15/2015   Wheezing on auscultation 09/13/2015    Past Surgical History:  Procedure Laterality Date   APPENDECTOMY     COLONOSCOPY  June 2014   Dr. Luis, multiple tubular adenomas removed, internal hemorrhoids. Next colonoscopy in 3 years.   DILATION AND CURETTAGE OF UTERUS     ESOPHAGOGASTRODUODENOSCOPY  06/07/2008    Gastritis. No celiac sprue/Small hiatal hernia   ESOPHAGOGASTRODUODENOSCOPY  June 2014   Dr. Luis, erosive esophagitis, grade B., large hiatal hernia.  Hydrogen breath test   05/17/2008   small bowel bacterial overgrowth/Hydrogen level rise consistent with small bowel bacterial   IR SACROPLASTY BILATERAL  12/13/2022   TONSILLECTOMY     TOTAL KNEE ARTHROPLASTY Right 10/09/2013   Procedure: RIGHT TOTAL KNEE ARTHROPLASTY;  Surgeon: Dempsey LULLA Moan, MD;  Location: [facility];  Service: Orthopedics;  Laterality: Right;    Current Medications: Current Meds  Medication Sig   acetaminophen  (TYLENOL ) 500 MG tablet Take 1,000 mg by mouth every 6 (six) hours as needed  for moderate pain.   aspirin  81 MG tablet Take 81 mg by mouth daily.   Biotin (BIOTIN 5000) 5 MG CAPS Take 5 mg by mouth daily.   cholecalciferol (VITAMIN D3) 25 MCG (1000 UNIT) tablet Take 1,000 Units by mouth daily.   Cyanocobalamin  (VITAMIN B-12 IJ) Inject 1 Dose as directed every 30 (thirty) days.   cyanocobalamin  (VITAMIN B12) 1000 MCG tablet Take 1,000 mcg by mouth daily.   fluticasone (FLONASE) 50 MCG/ACT nasal spray Place 2 sprays into both nostrils daily as needed for allergies.   hydrocortisone (ANUSOL-HC) 25 MG suppository Place rectally.   levothyroxine  (SYNTHROID ) 88 MCG tablet Take 88 mcg by mouth daily.   pantoprazole  (PROTONIX ) 40 MG tablet Take 40 mg by mouth 2 (two) times daily.   Polyethyl Glycol-Propyl Glycol (SYSTANE OP) Place 1 drop into both eyes daily as needed (dry eyes).   polyethylene glycol powder (GLYCOLAX /MIRALAX ) 17 GM/SCOOP powder Take 17 g by mouth daily.   traMADol  (ULTRAM ) 50 MG tablet Take 50 mg by mouth daily.   [DISCONTINUED] amLODipine  (NORVASC ) 5 MG tablet Take 1 tablet (5 mg total) by mouth 2 (two) times daily.   [DISCONTINUED] atenolol  (TENORMIN ) 25 MG tablet Take 25 mg by mouth daily.    [DISCONTINUED] ezetimibe  (ZETIA ) 10 MG tablet Take 10 mg by mouth at bedtime.   [DISCONTINUED] irbesartan  (AVAPRO ) 300 MG tablet Take 300 mg by mouth every morning.      Allergies:   Dicyclomine, Amoxicillin, Doxycycline, Gabapentin, Levaquin [levofloxacin in d5w], Lyrica  [pregabalin ], Statins, Sucralfate, Sulfa antibiotics, Fenofibrate  micronized, and Tizanidine   Social History   Socioeconomic History   Marital status: Widowed    Spouse name: Not on file   Number of children: Not on file   Years of education: Not on file   Highest education level: Not on file  Occupational History   Occupation: [employer]  Tobacco Use   Smoking status: Never   Smokeless tobacco: Never  Vaping Use   Vaping Use: Never used  Substance and Sexual Activity   Alcohol use:  No   Drug use: No   Sexual activity: Not Currently    Birth control/protection: Post-menopausal  Other Topics Concern   Not on file  Social History Narrative   Lives alone   Right Handed   Drinks 1-2 cups caffeine daily   Social Determinants of Health   Financial Resource Strain: Not on file  Food Insecurity: Not on file  Transportation Needs: Not on file  Physical Activity: Not on file  Stress: Not on file  Social Connections: Not on file     Family History: The patient's family history includes COPD in her son; Heart disease in her father; Liver disease in her father; Sleep apnea in her son; Stroke in her maternal grandmother and mother.  ROS:   Please see the history of present illness.    All other systems reviewed and are negative.  EKGs/Labs/Other Studies Reviewed:    The following studies were  reviewed today:  06/2021 Venous Duplex -  RIGHT:  - No evidence of deep vein thrombosis in the lower extremity. No indirect  evidence of obstruction proximal to the inguinal ligament.  - No cystic structure found in the popliteal fossa.    LEFT:  - No evidence of common femoral vein obstruction.   11/20/2017 myocardial perfusion-  Nuclear stress EF: 55%. The left ventricular ejection fraction is normal (55-65%). Defect 1: There is a medium defect of moderate severity present in the mid anteroseptal, mid inferoseptal, apical anterior, apical septal and apex location. This appears to be most consistent with artifact from the LBBB and is similar to the defect described in her previous myoview  in 2012. This is a low risk study.   11/05/2017 echo complete - EF 50 - 55%, mild concentric LVH, diffuse hypokinesis and paradoxical septal motion, grade I DD, moderate AR, mild MR, mild TR  EKG:  EKG is not ordered today.    Recent Labs: 12/13/2022: BUN 19; Creatinine, Ser 0.89; Hemoglobin 13.3; Platelets 222; Potassium 4.0; Sodium 140  Recent Lipid Panel    Component Value  Date/Time   CHOL 193 06/21/2015 0910   TRIG 137 06/21/2015 0910   HDL 51 06/21/2015 0910   CHOLHDL 3.8 06/21/2015 0910   VLDL 27 06/21/2015 0910   LDLCALC 115 (H) 06/21/2015 0910     Risk Assessment/Calculations:                Physical Exam:    VS:  BP 132/64   Pulse (!) 58   Ht 5' 4 (1.626 m)   Wt 154 lb (69.9 kg)   SpO2 100%   BMI 26.43 kg/m     Wt Readings from Last 3 Encounters:  02/18/23 154 lb (69.9 kg)  01/31/23 150 lb 6.4 oz (68.2 kg)  01/11/23 154 lb 3.2 oz (69.9 kg)     GEN:  Well nourished, well developed in no acute distress HEENT: Normal NECK: No JVD; No carotid bruits LYMPHATICS: No lymphadenopathy CARDIAC: RRR, no murmurs, rubs, gallops RESPIRATORY:  Clear to auscultation without rales, wheezing or rhonchi  ABDOMEN: Soft, non-tender, non-distended MUSCULOSKELETAL:  No edema; No deformity  SKIN: Warm and dry NEUROLOGIC:  Alert and oriented x 3 PSYCHIATRIC:  Normal affect   ASSESSMENT:    1. Essential hypertension   2. Mild aortic stenosis   3. Mixed hyperlipidemia   4. Chronic diastolic heart failure (HCC)   5. Bilateral carotid artery disease, unspecified type (HCC)   6. Pre-diabetes   7. Other fatigue   8. OSA (obstructive sleep apnea)    PLAN:    In order of problems listed above:  Hypertension - blood pressure today is 132/64, well controlled. She is very concerned about recent BP readings with systolic readings of 150's. She is taking Norvasc  5 mg twice daily, atenolol  25 mg daily, irbesartan  300 mg daily. Will change her Norvasc  to 10 mg once/day. She will continue to check her BP daily. We discussed return visit to check her BP, however she declines and advised she will check it daily and notify our office if her BP persists > 140 systolic after above medication change.   Chronic diastolic heart failure - echo in 2019, EF 55-60% with mild to moderate LVH, NYHA class 1, euvolemic. Continue atenolol , irbesartan .   Hyperlipidemia  -traditionally managed by her PCP, she is intolerant of statins. Has been on Zetia  for some time. Recent LDL on 01/08/23 was elevated at 163. Will refer her to  PharmD for lipid management to see if she is a candidate for PCSK9i.   Mild aortic stenosis -recent echo in 2019 revealed mild to moderate AR, denies SOB or HF symptoms. No indication for repeat imaging at this time.   Bilateral carotid disease - mild fibrous plaque noted on imaging in 2014, she recently had an eye exam and was told that she may have had a stroke and should f/u with carotid ultrasound. Will order repeat carotid US .   Pre-diabetes - recent fasting blood glucose was 108 with her PCP, she is concerned about this as she tries to adhere to a low carbohydrate diet. Will check her A1c today.   Fatigue - persistent and ongoing for some time, likely multifactorial r/t untreated OSA, stress. States it is slightly worse since her B12 was stopped after recent labwork showed it was elevated, she was receiving injections as well as taking PO supplements. Will repeat her B12 today to see if she can begin supplementation to help with her fatigue.   OSA - cannot tolerate any type of facial apparatus for CPAP. Discussed Inspire with her PCP, but she was advised to not proceed per Dr. Court. We discussed re-evaluation for OSA as it has been some time since she was evaluated and may be contributing to her fatigue, however she is not interested in repeat testing or further treatment for OSA.   Disposition - Check A1C, vitamin B12 today, carotid ultrasound, refer to pharmD for lipid management. Return in 6 months with Dr. Court.            Medication Adjustments/Labs and Tests Ordered: Current medicines are reviewed at length with the patient today.  Concerns regarding medicines are outlined above.  Orders Placed This Encounter  Procedures   Hemoglobin A1c   Vitamin B12   AMB Referral to [facility] Pharm-D   VAS US  CAROTID   Meds ordered  this encounter  Medications   ezetimibe  (ZETIA ) 10 MG tablet    Sig: Take 1 tablet (10 mg total) by mouth at bedtime.    Dispense:  90 tablet    Refill:  3   irbesartan  (AVAPRO ) 300 MG tablet    Sig: Take 1 tablet (300 mg total) by mouth daily.    Dispense:  90 tablet    Refill:  3   atenolol  (TENORMIN ) 25 MG tablet    Sig: Take 1 tablet (25 mg total) by mouth daily.    Dispense:  90 tablet    Refill:  3   amLODipine  (NORVASC ) 10 MG tablet    Sig: Take 1 tablet (10 mg total) by mouth daily.    Dispense:  90 tablet    Refill:  3    Dose change new Rx    Patient Instructions  Medication Instructions:   INCREASE Amlodipine  to 10 mg daily   *If you need a refill on your cardiac medications before your next appointment, please call your pharmacy*  Lab Work: Your physician recommends that you return for lab work TODAY:  Hemoglobin A1c Vitamin B12   If you have labs (blood work) drawn today and your tests are completely normal, you will receive your results only by: MyChart Message (if you have MyChart) OR A paper copy in the mail If you have any lab test that is abnormal or we need to change your treatment, we will call you to review the results.  Testing/Procedures: Your physician has requested that you have a carotid duplex. This test is an ultrasound  of the carotid arteries in your neck. It looks at blood flow through these arteries that supply the brain with blood. Allow one hour for this exam. There are no restrictions or special instructions.  Follow-Up: At [facility], you and your health needs are our priority.  As part of our continuing mission to provide you with exceptional heart care, we have created designated Provider Care Teams.  These Care Teams include your primary Cardiologist (physician) and Advanced Practice Providers (APPs -  Physician Assistants and Nurse Practitioners) who all work together to provide you with the care you need, when you need  it.  We recommend signing up for the patient portal called MyChart.  Sign up information is provided on this After Visit Summary.  MyChart is used to connect with patients for Virtual Visits (Telemedicine).  Patients are able to view lab/test results, encounter notes, upcoming appointments, etc.  Non-urgent messages can be sent to your provider as well.   To learn more about what you can do with MyChart, go to [email or web address].    Your next appointment:   6 month(s)  Provider:   Dorn Lesches, MD     Other Instructions CONTINUE to monitor blood pressure at home.     Signed, Delon JAYSON Hoover, NP  02/18/2023 12:03 PM    [facility] "

## 2023-02-18 ENCOUNTER — Encounter: Payer: Self-pay | Admitting: Physician Assistant

## 2023-02-18 ENCOUNTER — Ambulatory Visit: Payer: PPO | Attending: Physician Assistant | Admitting: Cardiology

## 2023-02-18 VITALS — BP 132/64 | HR 58 | Ht 64.0 in | Wt 154.0 lb

## 2023-02-18 DIAGNOSIS — G4733 Obstructive sleep apnea (adult) (pediatric): Secondary | ICD-10-CM | POA: Diagnosis not present

## 2023-02-18 DIAGNOSIS — E782 Mixed hyperlipidemia: Secondary | ICD-10-CM

## 2023-02-18 DIAGNOSIS — I5032 Chronic diastolic (congestive) heart failure: Secondary | ICD-10-CM | POA: Diagnosis not present

## 2023-02-18 DIAGNOSIS — I779 Disorder of arteries and arterioles, unspecified: Secondary | ICD-10-CM

## 2023-02-18 DIAGNOSIS — I350 Nonrheumatic aortic (valve) stenosis: Secondary | ICD-10-CM | POA: Diagnosis not present

## 2023-02-18 DIAGNOSIS — R7303 Prediabetes: Secondary | ICD-10-CM | POA: Diagnosis not present

## 2023-02-18 DIAGNOSIS — R5383 Other fatigue: Secondary | ICD-10-CM

## 2023-02-18 DIAGNOSIS — I10 Essential (primary) hypertension: Secondary | ICD-10-CM | POA: Diagnosis not present

## 2023-02-18 MED ORDER — EZETIMIBE 10 MG PO TABS: 10.0000 mg | ORAL_TABLET | Freq: Every day | ORAL | 3 refills | Status: DC

## 2023-02-18 MED ORDER — ATENOLOL 25 MG PO TABS: 25.0000 mg | ORAL_TABLET | Freq: Every day | ORAL | 3 refills | Status: DC

## 2023-02-18 MED ORDER — AMLODIPINE BESYLATE 10 MG PO TABS: 10.0000 mg | ORAL_TABLET | Freq: Every day | ORAL | 3 refills | Status: DC

## 2023-02-18 MED ORDER — IRBESARTAN 300 MG PO TABS: 300.0000 mg | ORAL_TABLET | Freq: Every day | ORAL | 3 refills | Status: DC

## 2023-02-18 NOTE — Patient Instructions (Signed)
Medication Instructions:   INCREASE Amlodipine to 10 mg daily   *If you need a refill on your cardiac medications before your next appointment, please call your pharmacy*  Lab Work: Your physician recommends that you return for lab work TODAY:  Hemoglobin A1c Vitamin B12   If you have labs (blood work) drawn today and your tests are completely normal, you will receive your results only by: MyChart Message (if you have MyChart) OR A paper copy in the mail If you have any lab test that is abnormal or we need to change your treatment, we will call you to review the results.  Testing/Procedures: Your physician has requested that you have a carotid duplex. This test is an ultrasound of the carotid arteries in your neck. It looks at blood flow through these arteries that supply the brain with blood. Allow one hour for this exam. There are no restrictions or special instructions.  Follow-Up: At [facility], you and your health needs are our priority.  As part of our continuing mission to provide you with exceptional heart care, we have created designated Provider Care Teams.  These Care Teams include your primary Cardiologist (physician) and Advanced Practice Providers (APPs -  Physician Assistants and Nurse Practitioners) who all work together to provide you with the care you need, when you need it.  We recommend signing up for the patient portal called "MyChart".  Sign up information is provided on this After Visit Summary.  MyChart is used to connect with patients for Virtual Visits (Telemedicine).  Patients are able to view lab/test results, encounter notes, upcoming appointments, etc.  Non-urgent messages can be sent to your provider as well.   To learn more about what you can do with MyChart, go to [email or web address].    Your next appointment:   6 month(s)  Provider:   Quay Burow, MD     Other Instructions CONTINUE to monitor blood pressure at home.

## 2023-02-19 ENCOUNTER — Telehealth: Payer: Self-pay | Admitting: Cardiovascular Disease

## 2023-02-19 LAB — VITAMIN B12: Vitamin B-12: 755 pg/mL (ref 232–1245)

## 2023-02-19 LAB — HEMOGLOBIN A1C
Est. average glucose Bld gHb Est-mCnc: 126 mg/dL
Hgb A1c MFr Bld: 6 % — ABNORMAL HIGH (ref 4.8–5.6)

## 2023-02-19 NOTE — Telephone Encounter (Signed)
Spoke to patient . She states she obtained the blood pressure reading about 2 hours( 11:30 am) after taking Amlodipine 10 mg , Irbesartan, Atenolol at 9:30 am  this morning.    Patient states she did rest prior to taking  blood pressure. RN  suggest patient  to continue as prescribed.    Patient also states she had very dry mouth and tongue is numb right after taking medication..  The symptoms have gone now.  She states she has many allergies and this is what happens she has a reaction.  Rn informed patient wil defer to J. Woody NP to review and contact patient.

## 2023-02-19 NOTE — Telephone Encounter (Signed)
Will discuss with Jo Parrish, for now, I think the best course is to stay on the higher dose of amlodipine. The numbness of tongue and dry mouth is unlikely to be related to increased dose of amlodipine. Would reassure the patient that although her BP is mildly higher, it is not dangerous, it generally takes blood pressure about 2 weeks to settle down on a new blood pressure medication/dosage.

## 2023-02-19 NOTE — Telephone Encounter (Signed)
Pt c/o medication issue:  1. Name of Medication:   amLODipine (NORVASC) 10 MG tablet   2. How are you currently taking this medication (dosage and times per day)?   As prescribed  3. Are you having a reaction (difficulty breathing--STAT)?   No  4. What is your medication issue?    Patient reports her BP today at 11:30 am was 151/83 and at 1:02 pm she took 3 tsp of vinegar as recommended and it brought her BP down to 144/74.  Patient states her mouth is very dry now and her tongue is numb.  Patient notes her medication was changed yesterday and she wants to know if she should go back to the old dosage.

## 2023-02-19 NOTE — Telephone Encounter (Signed)
Spoke to patient .  Information given to patient . She is aware to continue to take Amlodipine 10 mg. Patient is aware if symptoms become worse call 911 to be transport to ER  Patient verbalized understanding.

## 2023-02-20 DIAGNOSIS — R5383 Other fatigue: Secondary | ICD-10-CM | POA: Diagnosis not present

## 2023-02-21 ENCOUNTER — Other Ambulatory Visit: Payer: Self-pay | Admitting: Cardiology

## 2023-02-21 ENCOUNTER — Ambulatory Visit: Payer: PPO | Attending: Physician Assistant

## 2023-02-21 DIAGNOSIS — I5032 Chronic diastolic (congestive) heart failure: Secondary | ICD-10-CM

## 2023-02-21 DIAGNOSIS — I779 Disorder of arteries and arterioles, unspecified: Secondary | ICD-10-CM

## 2023-02-21 DIAGNOSIS — R5383 Other fatigue: Secondary | ICD-10-CM

## 2023-02-21 DIAGNOSIS — I10 Essential (primary) hypertension: Secondary | ICD-10-CM

## 2023-02-21 DIAGNOSIS — G4733 Obstructive sleep apnea (adult) (pediatric): Secondary | ICD-10-CM

## 2023-02-21 DIAGNOSIS — I350 Nonrheumatic aortic (valve) stenosis: Secondary | ICD-10-CM

## 2023-02-21 DIAGNOSIS — R7303 Prediabetes: Secondary | ICD-10-CM

## 2023-02-21 DIAGNOSIS — E782 Mixed hyperlipidemia: Secondary | ICD-10-CM

## 2023-03-13 DIAGNOSIS — T402X5S Adverse effect of other opioids, sequela: Secondary | ICD-10-CM | POA: Diagnosis not present

## 2023-03-13 DIAGNOSIS — M5416 Radiculopathy, lumbar region: Secondary | ICD-10-CM | POA: Diagnosis not present

## 2023-03-13 DIAGNOSIS — M5136 Other intervertebral disc degeneration, lumbar region: Secondary | ICD-10-CM | POA: Diagnosis not present

## 2023-03-19 DIAGNOSIS — M5416 Radiculopathy, lumbar region: Secondary | ICD-10-CM | POA: Diagnosis not present

## 2023-03-21 DIAGNOSIS — H43813 Vitreous degeneration, bilateral: Secondary | ICD-10-CM | POA: Diagnosis not present

## 2023-03-21 DIAGNOSIS — H5310 Unspecified subjective visual disturbances: Secondary | ICD-10-CM | POA: Diagnosis not present

## 2023-03-21 DIAGNOSIS — H31001 Unspecified chorioretinal scars, right eye: Secondary | ICD-10-CM | POA: Diagnosis not present

## 2023-03-27 ENCOUNTER — Ambulatory Visit: Payer: PPO

## 2023-03-29 DIAGNOSIS — G4739 Other sleep apnea: Secondary | ICD-10-CM | POA: Diagnosis not present

## 2023-03-29 DIAGNOSIS — G629 Polyneuropathy, unspecified: Secondary | ICD-10-CM | POA: Diagnosis not present

## 2023-03-29 DIAGNOSIS — E039 Hypothyroidism, unspecified: Secondary | ICD-10-CM | POA: Diagnosis not present

## 2023-03-29 DIAGNOSIS — Z1331 Encounter for screening for depression: Secondary | ICD-10-CM | POA: Diagnosis not present

## 2023-03-29 DIAGNOSIS — K8681 Exocrine pancreatic insufficiency: Secondary | ICD-10-CM | POA: Diagnosis not present

## 2023-03-29 DIAGNOSIS — I5032 Chronic diastolic (congestive) heart failure: Secondary | ICD-10-CM | POA: Diagnosis not present

## 2023-03-29 DIAGNOSIS — K589 Irritable bowel syndrome without diarrhea: Secondary | ICD-10-CM | POA: Diagnosis not present

## 2023-03-29 DIAGNOSIS — M1991 Primary osteoarthritis, unspecified site: Secondary | ICD-10-CM | POA: Diagnosis not present

## 2023-03-29 DIAGNOSIS — K219 Gastro-esophageal reflux disease without esophagitis: Secondary | ICD-10-CM | POA: Diagnosis not present

## 2023-03-29 DIAGNOSIS — I10 Essential (primary) hypertension: Secondary | ICD-10-CM | POA: Diagnosis not present

## 2023-03-29 DIAGNOSIS — Z6826 Body mass index (BMI) 26.0-26.9, adult: Secondary | ICD-10-CM | POA: Diagnosis not present

## 2023-04-23 NOTE — Progress Notes (Signed)
Initial neurology clinic note  SERVICE DATE: 04/26/23  Reason for Evaluation: Consultation requested by Elfredia Nevins, MD for an opinion regarding neuropathy. My final recommendations will be communicated back to the requesting physician by way of shared medical record or letter to requesting physician via [place] mail.  HPI: This is Ms. Jo Parrish, a 89 y.o. right-handed female with a medical history of vitamin B12 deficiency, pre-diabetes, chronic pain, HTN, HLD, hypothyroidism, GERD, OA, OSA (not on CPAP) who presents to neurology clinic with the chief complaint of burning and numbness in both legs. The patient is alone today.  Salty taste in mouth at 89 years old that continues until now. Patient states she was seen at [facility] and told she had Sjogren's, lupus per patient. She has also been seen at [facility].  Patient has burning and numbness in both legs. This has been present for about 2 years ago. It started in both feet. It current is up to above the ankles/low calf.   Patient has low back pain and has a history of injections from ortho. She has pain that radiates from her back into her legs.  Patient has tried gabapentin in the past, but could not tolerate it. Patient also tried Lyrica. She could tolerate this. She said both cause mouth problems. She has tried multiple creams as well. She does not recognize the name lidocaine cream. She does not tolerate medications well due to Sjogren's per patient.  She takes tramadol for back pain. She also takes tylenol. This can help with the burning in her feet.  Patient has been previously seen in neurology by Dr. Teresa Coombs at [facility] (last seen in clinic on 08/01/21). Most recent assessment and plan:  89 y.o. year old female with medical history of reported autoimmune disease, osteoarthritis, polyarthritis, hypothyroidism and hypertension who is presenting for bilateral lower extremity neuropathy.  Patient stated for the past 6 months she has been having  burning pain worse on the right and worse at night. Burning sensation and numbness also involving both hands.  She has tried gabapentin in the past but she discontinued due to mouth sores.  On exam she does have decreased sensation to light touch, vibration, and pinprick involving both lower extremities and bilateral hands in a stocking glove distribution right greater than left.  I recommended patient to give a trial of pregabalin 50 mg nightly starting 25 mg nightly then increase to 50 mg nightly.  If the pregabalin does not improve her symptoms then we can try lidocaine cream.  I also prescribed her diclofenac for her joint pain.  Patient is to follow-up in 3 months     1. Other polyneuropathy   2. Cramp in lower leg       PLAN: Trial of Pregabalin for neuropathic pain              Take 1/2 pill nightly for one week              Then increase to 1 tab nightly  You can try Diclofenac as needed for the pain You can try Lidocaine cream in both legs if Pregabalin not helpful.  Return to clinic in 3 months.   She does not report any constitutional symptoms like fever, night sweats, anorexia or unintentional weight loss.  EtOH use: Never  Restrictive diet? No  She has not done PT recently.  MEDICATIONS:  Outpatient Encounter Medications as of 04/26/2023  Medication Sig   acetaminophen (TYLENOL) 500 MG tablet Take 1,000  mg by mouth every 6 (six) hours as needed for moderate pain.   amLODipine (NORVASC) 10 MG tablet Take 1 tablet (10 mg total) by mouth daily.   aspirin 81 MG tablet Take 81 mg by mouth daily.   atenolol (TENORMIN) 25 MG tablet Take 1 tablet (25 mg total) by mouth daily.   Biotin (BIOTIN 5000) 5 MG CAPS Take 5 mg by mouth daily.   cholecalciferol (VITAMIN D3) 25 MCG (1000 UNIT) tablet Take 1,000 Units by mouth daily.   cyanocobalamin (VITAMIN B12) 1000 MCG tablet Take 1,000 mcg by mouth daily.   DULoxetine (CYMBALTA) 30 MG capsule Take 1 capsule (30 mg total) by mouth daily.    ezetimibe (ZETIA) 10 MG tablet Take 1 tablet (10 mg total) by mouth at bedtime.   fluticasone (FLONASE) 50 MCG/ACT nasal spray Place 2 sprays into both nostrils daily as needed for allergies.   hydrocortisone (ANUSOL-HC) 25 MG suppository Place rectally.   irbesartan (AVAPRO) 300 MG tablet Take 1 tablet (300 mg total) by mouth daily.   levothyroxine (SYNTHROID) 88 MCG tablet Take 88 mcg by mouth daily.   pantoprazole (PROTONIX) 40 MG tablet Take 40 mg by mouth 2 (two) times daily.   Polyethyl Glycol-Propyl Glycol (SYSTANE OP) Place 1 drop into both eyes daily as needed (dry eyes).   polyethylene glycol powder (GLYCOLAX/MIRALAX) 17 GM/SCOOP powder Take 17 g by mouth daily.   traMADol (ULTRAM) 50 MG tablet Take 50 mg by mouth daily.   Cyanocobalamin (VITAMIN B-12 IJ) Inject 1 Dose as directed every 30 (thirty) days. (Patient not taking: Reported on 04/26/2023)   No facility-administered encounter medications on file as of 04/26/2023.    PAST MEDICAL HISTORY: Past Medical History:  Diagnosis Date   Bacterial overgrowth syndrome    Small bowel   Complication of anesthesia    Constipation    Cough 09/13/2015   GERD (gastroesophageal reflux disease)    Hematuria 09/13/2015   Hyperlipidemia    Hypertension    Hypothyroidism    Left bundle branch block    Neuropathy    OSA (obstructive sleep apnea)    UNABLE TO TOLERATE  MASK   PONV (postoperative nausea and vomiting)    Rheumatoid arthritis(714.0)    Sjogren's syndrome (HCC)    UI (urinary incontinence) 01/16/2016   Ulcer, esophagus    Urinary frequency 07/04/2015   UTI (lower urinary tract infection) 09/13/2015   Vaginal atrophy 08/02/2015   Vaginal burning 07/04/2015   Vaginal dryness 07/04/2015   Vulvar itching 08/15/2015   Wheezing on auscultation 09/13/2015    PAST SURGICAL HISTORY: Past Surgical History:  Procedure Laterality Date   APPENDECTOMY     COLONOSCOPY  June 2014   Dr. Kinnie Scales, multiple tubular adenomas  removed, internal hemorrhoids. Next colonoscopy in 3 years.   DILATION AND CURETTAGE OF UTERUS     ESOPHAGOGASTRODUODENOSCOPY  06/07/2008    Gastritis. No celiac sprue/Small hiatal hernia   ESOPHAGOGASTRODUODENOSCOPY  June 2014   Dr. Kinnie Scales, erosive esophagitis, grade B., large hiatal hernia.   Hydrogen breath test   05/17/2008   small bowel bacterial overgrowth/Hydrogen level rise consistent with small bowel bacterial   IR SACROPLASTY BILATERAL  12/13/2022   TONSILLECTOMY     TOTAL KNEE ARTHROPLASTY Right 10/09/2013   Procedure: RIGHT TOTAL KNEE ARTHROPLASTY;  Surgeon: Loanne Drilling, MD;  Location: [facility];  Service: Orthopedics;  Laterality: Right;    ALLERGIES: Allergies  Allergen Reactions   Dicyclomine Swelling    Patient reported tongue swelling  when taking   Amoxicillin     Mouth sores   Doxycycline     Mouth sores   Gabapentin     Mouth sores   Levaquin [Levofloxacin In D5w]     Mouth sores   Lyrica [Pregabalin]     Mouth sores   Statins Other (See Comments)    Leg problems   Sucralfate     Sore mouth   Sulfa Antibiotics Other (See Comments)    Mouth sores   Fenofibrate Micronized Other (See Comments)    unknown   Tizanidine Other (See Comments)    Dry mouth, slurred speech    FAMILY HISTORY: Family History  Problem Relation Age of Onset   Stroke Mother    Heart disease Father    Liver disease Father    COPD Son    Sleep apnea Son    Stroke Maternal Grandmother     SOCIAL HISTORY: Social History   Tobacco Use   Smoking status: Never   Smokeless tobacco: Never  Vaping Use   Vaping Use: Never used  Substance Use Topics   Alcohol use: No   Drug use: No   Social History   Social History Narrative   Lives alone   Right Handed   Drinks 1-2 cups caffeine daily      Are you currently employed ?    What is your current occupation?retired   Do you live at home alone?yes   Who lives with you?    What type of home do you live in: 1 story or 2  story? one    [employer]     OBJECTIVE: PHYSICAL EXAM: BP (!) 141/70   Pulse 62   Ht 5\' 4"  (1.626 m)   Wt 150 lb (68 kg)   SpO2 96%   BMI 25.75 kg/m   General: General appearance: Awake and alert. No distress. Cooperative with exam.  HEENT: Atraumatic. Anicteric. Lungs: Non-labored breathing on room air  Extremities: No edema. Arthritic deformities in bilateral hands, wrists. Psych: Affect appropriate.  Neurological: Mental Status: Alert. Speech fluent. No pseudobulbar affect Cranial Nerves: CNII: No RAPD. Visual fields grossly intact. CNIII, IV, VI: PERRL. No nystagmus. EOMI. CN V: Facial sensation intact bilaterally to fine touch. CN VII: Facial muscles symmetric and strong. No ptosis at rest. CN VIII: Hearing grossly intact bilaterally. CN IX: No hypophonia. CN X: Palate elevates symmetrically. CN XI: Full strength shoulder shrug bilaterally. CN XII: Tongue protrusion full and midline. No atrophy or fasciculations. No significant dysarthria Motor: Tone is normal.  Individual muscle group testing (MRC grade out of 5):  Movement     Neck flexion 5    Neck extension 5     Right Left   Shoulder abduction 5- 5 Right shoulder pain  Elbow flexion 5 5   Elbow extension 5- 5 Right limited by pain  Finger abduction - FDI 5 5   Finger abduction - ADM 5 5   Finger extension 5 5   Finger flexion 5 5    Hip flexion 5- 5-   Hip extension 5 5   Hip adduction 5 5   Hip abduction 5 5   Knee extension 5 5   Knee flexion 5 5   Dorsiflexion 5 5   Plantarflexion 5 5   Inversion 5 5   Eversion 5 5   Great toe extension 5 5   Great toe flexion 5 5     Reflexes:  Right Left  Bicep 2+ 2+   Tricep 2+ 2+   BrRad 2+ 2+   Knee 0 2+ Right knee replacement  Ankle 0 0    Pathological Reflexes: Babinski: flexor response bilaterally Hoffman: absent bilaterally Troemner: absent bilaterally Sensation: Pinprick: Intact in all extremities Vibration: Intact in  bilateral upper extremities. Absent in bilateral great toes, diminished in ankles, fully present in patella Proprioception: Intact in bilateral great toes Coordination: Intact finger-to- nose-finger bilaterally. Romberg negative. Gait: Able to rise from chair with arms crossed unassisted. Walks with a cane. Narrow-base, slow, mild imbalance.  Lab and Test Review: Internal labs: 02/18/23: HbA1c: 6.0 B12: 755  External labs: 12/2022: B12: > 2000 Folate wnl HbA1c 6.0  ASSESSMENT: Jo Parrish is a 89 y.o. female who presents for evaluation of numbness and burning pain in feet. She has a relevant medical history of vitamin B12 deficiency, pre-diabetes, chronic pain, HTN, HLD, hypothyroidism, GERD, OA, OSA (not on CPAP). Her neurological examination is pertinent for absent vibratory sense in bilateral feet and gait imbalance. Available diagnostic data is significant for HbA1c of 6.0, B12 of 755. Patient's symptoms are likely multifactorial with contributions from a distal symmetric polyneuropathy, OA, and lumbar spine disease.  PLAN: -Blood work: B1, IFE -Will try Cymbalta 30 mg qhs -Lidocaine cream PRN -Discussed PT, patient will defer for now  -Return to clinic in 6 months  The impression above as well as the plan as outlined below were extensively discussed with the patient who voiced understanding. All questions were answered to their satisfaction.  The patient was counseled on pertinent fall precautions per the printed material provided today, and as noted under the "Patient Instructions" section below.  When available, results of the above investigations and possible further recommendations will be communicated to the patient via telephone/MyChart. Patient to call office if not contacted after expected testing turnaround time.   Total time spent reviewing records, interview, history/exam, documentation, and coordination of care on day of encounter:  45 min   Thank you for allowing  me to participate in patient's care.  If I can answer any additional questions, I would be pleased to do so.  Jacquelyne Balint, MD   CC: Elfredia Nevins, MD [address] [place] [address]  CC: Referring provider: Elfredia Nevins, MD [address] [place],  [place] [address]

## 2023-04-26 ENCOUNTER — Encounter: Payer: Self-pay | Admitting: Neurology

## 2023-04-26 ENCOUNTER — Other Ambulatory Visit (INDEPENDENT_AMBULATORY_CARE_PROVIDER_SITE_OTHER): Payer: PPO

## 2023-04-26 ENCOUNTER — Ambulatory Visit (INDEPENDENT_AMBULATORY_CARE_PROVIDER_SITE_OTHER): Payer: PPO | Admitting: Neurology

## 2023-04-26 VITALS — BP 141/70 | HR 62 | Ht 64.0 in | Wt 150.0 lb

## 2023-04-26 DIAGNOSIS — R2689 Other abnormalities of gait and mobility: Secondary | ICD-10-CM

## 2023-04-26 DIAGNOSIS — M5416 Radiculopathy, lumbar region: Secondary | ICD-10-CM | POA: Diagnosis not present

## 2023-04-26 DIAGNOSIS — G629 Polyneuropathy, unspecified: Secondary | ICD-10-CM

## 2023-04-26 MED ORDER — DULOXETINE HCL 30 MG PO CPEP: 30.0000 mg | ORAL_CAPSULE | Freq: Every day | ORAL | 5 refills | Status: DC

## 2023-04-26 NOTE — Patient Instructions (Signed)
I will get blood work today. I will be in touch when I have your results.  We will try Cymbalta 30 mg at bedtime for your burning pain in your legs.  You can also try Lidocaine cream as needed. Apply wear you have pain, tingling, or burning. Wear gloves to prevent your hands being numb. This can be bought over the counter at any drug store or online.   I will see you back in clinic in 6 months. Please let me know if you have any questions or concerns in the meantime.   The physicians and staff at [facility] are committed to providing excellent care. You may receive a survey requesting feedback about your experience at our office. We strive to receive "very good" responses to the survey questions. If you feel that your experience would prevent you from giving the office a "very good " response, please contact our office to try to remedy the situation. We may be reached at [phone]. Thank you for taking the time out of your busy day to complete the survey.  Jacquelyne Balint, MD [facility]  Preventing Falls at [facility] are common, often dreaded events in the lives of older people. Aside from the obvious injuries and even death that may result, fall can cause wide-ranging consequences including loss of independence, mental decline, decreased activity and mobility. Younger people are also at risk of falling, especially those with chronic illnesses and fatigue.  Ways to reduce risk for falling Examine diet and medications. Warm foods and alcohol dilate blood vessels, which can lead to dizziness when standing. Sleep aids, antidepressants and pain medications can also increase the likelihood of a fall.  Get a vision exam. Poor vision, cataracts and glaucoma increase the chances of falling.  Check foot gear. Shoes should fit snugly and have a sturdy, nonskid sole and a broad, low heel  Participate in a physician-approved exercise program to build and maintain muscle strength and  improve balance and coordination. Programs that use ankle weights or stretch bands are excellent for muscle-strengthening. Water aerobics programs and low-impact Tai Chi programs have also been shown to improve balance and coordination.  Increase vitamin D intake. Vitamin D improves muscle strength and increases the amount of calcium the body is able to absorb and deposit in bones.  How to prevent falls from common hazards Floors - Remove all loose wires, cords, and throw rugs. Minimize clutter. Make sure rugs are anchored and smooth. Keep furniture in its usual place.  Chairs -- Use chairs with straight backs, armrests and firm seats. Add firm cushions to existing pieces to add height.  Bathroom - Install grab bars and non-skid tape in the tub or shower. Use a bathtub transfer bench or a shower chair with a back support Use an elevated toilet seat and/or safety rails to assist standing from a low surface. Do not use towel racks or bathroom tissue holders to help you stand.  Lighting - Make sure halls, stairways, and entrances are well-lit. Install a night light in your bathroom or hallway. Make sure there is a light switch at the top and bottom of the staircase. Turn lights on if you get up in the middle of the night. Make sure lamps or light switches are within reach of the bed if you have to get up during the night.  Kitchen - Install non-skid rubber mats near the sink and stove. Clean spills immediately. Store frequently used utensils, pots, pans between waist and eye level.  This helps prevent reaching and bending. Sit when getting things out of lower cupboards.  Living room/ Bedrooms - Place furniture with wide spaces in between, giving enough room to move around. Establish a route through the living room that gives you something to hold onto as you walk.  Stairs - Make sure treads, rails, and rugs are secure. Install a rail on both sides of the stairs. If stairs are a threat, it might be  helpful to arrange most of your activities on the lower level to reduce the number of times you must climb the stairs.  Entrances and doorways - Install metal handles on the walls adjacent to the doorknobs of all doors to make it more secure as you travel through the doorway.  Tips for maintaining balance Keep at least one hand free at all times. Try using a backpack or fanny pack to hold things rather than carrying them in your hands. Never carry objects in both hands when walking as this interferes with keeping your balance.  Attempt to swing both arms from front to back while walking. This might require a conscious effort if Parkinson's disease has diminished your movement. It will, however, help you to maintain balance and posture, and reduce fatigue.  Consciously lift your feet off of the ground when walking. Shuffling and dragging of the feet is a common culprit in losing your balance.  When trying to navigate turns, use a "U" technique of facing forward and making a wide turn, rather than pivoting sharply.  Try to stand with your feet shoulder-length apart. When your feet are close together for any length of time, you increase your risk of losing your balance and falling.  Do one thing at a time. Don't try to walk and accomplish another task, such as reading or looking around. The decrease in your automatic reflexes complicates motor function, so the less distraction, the better.  Do not wear rubber or gripping soled shoes, they might "catch" on the floor and cause tripping.  Move slowly when changing positions. Use deliberate, concentrated movements and, if needed, use a grab bar or walking aid. Count 15 seconds between each movement. For example, when rising from a seated position, wait 15 seconds after standing to begin walking.  If balance is a continuous problem, you might want to consider a walking aid such as a cane, walking stick, or walker. Once you've mastered walking with help,  you might be ready to try it on your own again.

## 2023-04-30 ENCOUNTER — Telehealth: Payer: Self-pay | Admitting: Anesthesiology

## 2023-04-30 NOTE — Telephone Encounter (Signed)
Pt called stating she took Duloxetine 30 mg yesterday, she's been having nausea, stomach pain, states her head felt "strange". States she wont take medication again.

## 2023-05-01 LAB — IMMUNOFIXATION ELECTROPHORESIS
IgG (Immunoglobin G), Serum: 840 mg/dL (ref 600–1540)
IgM, Serum: 57 mg/dL (ref 50–300)
Immunoglobulin A: 191 mg/dL (ref 70–320)

## 2023-05-01 LAB — VITAMIN B1: Vitamin B1 (Thiamine): 21 nmol/L (ref 8–30)

## 2023-05-02 NOTE — Telephone Encounter (Signed)
Called note sent to dr. Loleta Chance

## 2023-05-16 DIAGNOSIS — E663 Overweight: Secondary | ICD-10-CM | POA: Diagnosis not present

## 2023-05-16 DIAGNOSIS — I5032 Chronic diastolic (congestive) heart failure: Secondary | ICD-10-CM | POA: Diagnosis not present

## 2023-05-16 DIAGNOSIS — I10 Essential (primary) hypertension: Secondary | ICD-10-CM | POA: Diagnosis not present

## 2023-05-16 DIAGNOSIS — J329 Chronic sinusitis, unspecified: Secondary | ICD-10-CM | POA: Diagnosis not present

## 2023-05-16 DIAGNOSIS — M1991 Primary osteoarthritis, unspecified site: Secondary | ICD-10-CM | POA: Diagnosis not present

## 2023-05-16 DIAGNOSIS — G4739 Other sleep apnea: Secondary | ICD-10-CM | POA: Diagnosis not present

## 2023-05-16 DIAGNOSIS — H6120 Impacted cerumen, unspecified ear: Secondary | ICD-10-CM | POA: Diagnosis not present

## 2023-05-16 DIAGNOSIS — Z6826 Body mass index (BMI) 26.0-26.9, adult: Secondary | ICD-10-CM | POA: Diagnosis not present

## 2023-05-16 DIAGNOSIS — M810 Age-related osteoporosis without current pathological fracture: Secondary | ICD-10-CM | POA: Diagnosis not present

## 2023-05-21 DIAGNOSIS — M25511 Pain in right shoulder: Secondary | ICD-10-CM | POA: Diagnosis not present

## 2023-05-21 DIAGNOSIS — M5136 Other intervertebral disc degeneration, lumbar region: Secondary | ICD-10-CM | POA: Diagnosis not present

## 2023-05-27 DIAGNOSIS — M65341 Trigger finger, right ring finger: Secondary | ICD-10-CM | POA: Diagnosis not present

## 2023-06-17 HISTORY — PX: TRIGGER FINGER RELEASE: SHX641

## 2023-06-26 DIAGNOSIS — Z4789 Encounter for other orthopedic aftercare: Secondary | ICD-10-CM | POA: Diagnosis not present

## 2023-06-26 DIAGNOSIS — M65341 Trigger finger, right ring finger: Secondary | ICD-10-CM | POA: Diagnosis not present

## 2023-07-03 DIAGNOSIS — L942 Calcinosis cutis: Secondary | ICD-10-CM | POA: Diagnosis not present

## 2023-07-12 DIAGNOSIS — M79641 Pain in right hand: Secondary | ICD-10-CM | POA: Diagnosis not present

## 2023-07-16 DIAGNOSIS — D518 Other vitamin B12 deficiency anemias: Secondary | ICD-10-CM | POA: Diagnosis not present

## 2023-07-17 NOTE — Progress Notes (Unsigned)
Office Visit Note  Patient: Jo Parrish             Date of Birth: 1926-09-04           MRN: [id number]             PCP: Elfredia Nevins, MD Referring: Elfredia Nevins, MD Visit Date: 07/18/2023 Occupation: @GUAROCC @  Subjective:  No chief complaint on file.   History of Present Illness: Jo Parrish is a 89 y.o. female ***     Activities of Daily Living:  Patient reports morning stiffness for *** {minute/hour:19697}.   Patient {ACTIONS;DENIES/REPORTS:21021675::"Denies"} nocturnal pain.  Difficulty dressing/grooming: {ACTIONS;DENIES/REPORTS:21021675::"Denies"} Difficulty climbing stairs: {ACTIONS;DENIES/REPORTS:21021675::"Denies"} Difficulty getting out of chair: {ACTIONS;DENIES/REPORTS:21021675::"Denies"} Difficulty using hands for taps, buttons, cutlery, and/or writing: {ACTIONS;DENIES/REPORTS:21021675::"Denies"}  No Rheumatology ROS completed.   PMFS History:  Patient Active Problem List   Diagnosis Date Noted   Mild aortic stenosis 12/12/2018   UI (urinary incontinence) 01/16/2016   UTI (lower urinary tract infection) 09/13/2015   Hematuria 09/13/2015   Cough 09/13/2015   Wheezing on auscultation 09/13/2015   Vulvar itching 08/15/2015   Vaginal atrophy 08/02/2015   Urinary frequency 07/04/2015   Vaginal burning 07/04/2015   Vaginal dryness 07/04/2015   Rash, legs 06/21/2015   Constipation 01/07/2014   Anemia 10/20/2013   Heme positive stool 10/20/2013   Abnormal LFTs 10/20/2013   Nausea alone 10/20/2013   Cellulitis of leg, right 10/16/2013   Unspecified constipation 10/16/2013   Postoperative anemia due to acute blood loss 10/12/2013   Hyponatremia 10/12/2013   OA (osteoarthritis) of knee 10/09/2013   Heart palpitations 10/07/2013   Hypothyroidism 08/13/2013   Chronic diastolic heart failure (HCC) 08/06/2013   Essential hypertension 08/06/2013   Hyperlipidemia 08/06/2013   Knee pain 03/03/2013    Past Medical History:  Diagnosis Date   Bacterial  overgrowth syndrome    Small bowel   Complication of anesthesia    Constipation    Cough 09/13/2015   GERD (gastroesophageal reflux disease)    Hematuria 09/13/2015   Hyperlipidemia    Hypertension    Hypothyroidism    Left bundle branch block    Neuropathy    OSA (obstructive sleep apnea)    UNABLE TO TOLERATE  MASK   PONV (postoperative nausea and vomiting)    Rheumatoid arthritis(714.0)    Sjogren's syndrome (HCC)    UI (urinary incontinence) 01/16/2016   Ulcer, esophagus    Urinary frequency 07/04/2015   UTI (lower urinary tract infection) 09/13/2015   Vaginal atrophy 08/02/2015   Vaginal burning 07/04/2015   Vaginal dryness 07/04/2015   Vulvar itching 08/15/2015   Wheezing on auscultation 09/13/2015    Family History  Problem Relation Age of Onset   Stroke Mother    Heart disease Father    Liver disease Father    COPD Son    Sleep apnea Son    Stroke Maternal Grandmother    Past Surgical History:  Procedure Laterality Date   APPENDECTOMY     COLONOSCOPY  June 2014   Dr. Kinnie Scales, multiple tubular adenomas removed, internal hemorrhoids. Next colonoscopy in 3 years.   DILATION AND CURETTAGE OF UTERUS     ESOPHAGOGASTRODUODENOSCOPY  06/07/2008    Gastritis. No celiac sprue/Small hiatal hernia   ESOPHAGOGASTRODUODENOSCOPY  June 2014   Dr. Kinnie Scales, erosive esophagitis, grade B., large hiatal hernia.   Hydrogen breath test   05/17/2008   small bowel bacterial overgrowth/Hydrogen level rise consistent with small bowel bacterial   IR SACROPLASTY BILATERAL  12/13/2022   TONSILLECTOMY     TOTAL KNEE ARTHROPLASTY Right 10/09/2013   Procedure: RIGHT TOTAL KNEE ARTHROPLASTY;  Surgeon: Loanne Drilling, MD;  Location: [facility];  Service: Orthopedics;  Laterality: Right;   Social History   Social History Narrative   Lives alone   Right Handed   Drinks 1-2 cups caffeine daily      Are you currently employed ?    What is your current occupation?retired   Do you live at  home alone?yes   Who lives with you?    What type of home do you live in: 1 story or 2 story? one    [employer]   Immunization History  Administered Date(s) Administered   Moderna Sars-Covid-2 Vaccination 01/08/2020, 02/03/2020, 10/29/2020     Objective: Vital Signs: There were no vitals taken for this visit.   Physical Exam   Musculoskeletal Exam: ***  CDAI Exam: CDAI Score: -- Patient Global: --; Provider Global: -- Swollen: --; Tender: -- Joint Exam 07/18/2023   No joint exam has been documented for this visit   There is currently no information documented on the homunculus. Go to the Rheumatology activity and complete the homunculus joint exam.  Investigation: No additional findings.  Imaging: No results found.  Recent Labs: Lab Results  Component Value Date   WBC 5.2 12/13/2022   HGB 13.3 12/13/2022   PLT 222 12/13/2022   NA 140 12/13/2022   K 4.0 12/13/2022   CL 106 12/13/2022   CO2 26 12/13/2022   GLUCOSE 103 (H) 12/13/2022   BUN 19 12/13/2022   CREATININE 0.89 12/13/2022   BILITOT 0.6 06/29/2021   ALKPHOS 40 06/29/2021   AST 22 06/29/2021   ALT 20 06/29/2021   PROT 7.0 06/29/2021   ALBUMIN 4.2 06/29/2021   CALCIUM 9.5 12/13/2022   GFRAA 58 (L) 06/11/2018    Speciality Comments: Reclast 2017-2022  Procedures:  No procedures performed Allergies: Dicyclomine, Amoxicillin, Doxycycline, Gabapentin, Levaquin [levofloxacin in d5w], Lyrica [pregabalin], Statins, Sucralfate, Sulfa antibiotics, Fenofibrate micronized, and Tizanidine   Assessment / Plan:     Visit Diagnoses: No diagnosis found.  Orders: No orders of the defined types were placed in this encounter.  No orders of the defined types were placed in this encounter.   Face-to-face time spent with patient was *** minutes. Greater than 50% of time was spent in counseling and coordination of care.  Follow-Up Instructions: No follow-ups on file.   Ellen Henri, CMA  Note - This  record has been created using Animal nutritionist.  Chart creation errors have been sought, but may not always  have been located. Such creation errors do not reflect on  the standard of medical care.

## 2023-07-18 ENCOUNTER — Ambulatory Visit: Payer: PPO | Attending: Rheumatology | Admitting: Rheumatology

## 2023-07-18 ENCOUNTER — Encounter: Payer: Self-pay | Admitting: Rheumatology

## 2023-07-18 VITALS — BP 120/73 | HR 68 | Resp 13 | Ht 62.5 in | Wt 148.2 lb

## 2023-07-18 DIAGNOSIS — M797 Fibromyalgia: Secondary | ICD-10-CM

## 2023-07-18 DIAGNOSIS — G629 Polyneuropathy, unspecified: Secondary | ICD-10-CM | POA: Diagnosis not present

## 2023-07-18 DIAGNOSIS — M810 Age-related osteoporosis without current pathological fracture: Secondary | ICD-10-CM

## 2023-07-18 DIAGNOSIS — M19012 Primary osteoarthritis, left shoulder: Secondary | ICD-10-CM

## 2023-07-18 DIAGNOSIS — Z8639 Personal history of other endocrine, nutritional and metabolic disease: Secondary | ICD-10-CM

## 2023-07-18 DIAGNOSIS — I5032 Chronic diastolic (congestive) heart failure: Secondary | ICD-10-CM | POA: Diagnosis not present

## 2023-07-18 DIAGNOSIS — M5136 Other intervertebral disc degeneration, lumbar region: Secondary | ICD-10-CM

## 2023-07-18 DIAGNOSIS — G894 Chronic pain syndrome: Secondary | ICD-10-CM

## 2023-07-18 DIAGNOSIS — M19011 Primary osteoarthritis, right shoulder: Secondary | ICD-10-CM | POA: Diagnosis not present

## 2023-07-18 DIAGNOSIS — M3500 Sicca syndrome, unspecified: Secondary | ICD-10-CM | POA: Diagnosis not present

## 2023-07-18 DIAGNOSIS — G8929 Other chronic pain: Secondary | ICD-10-CM

## 2023-07-18 DIAGNOSIS — M25562 Pain in left knee: Secondary | ICD-10-CM

## 2023-07-18 DIAGNOSIS — I10 Essential (primary) hypertension: Secondary | ICD-10-CM | POA: Diagnosis not present

## 2023-07-18 DIAGNOSIS — Z96651 Presence of right artificial knee joint: Secondary | ICD-10-CM

## 2023-07-18 DIAGNOSIS — E538 Deficiency of other specified B group vitamins: Secondary | ICD-10-CM | POA: Diagnosis not present

## 2023-07-18 DIAGNOSIS — I350 Nonrheumatic aortic (valve) stenosis: Secondary | ICD-10-CM

## 2023-07-18 DIAGNOSIS — M51369 Other intervertebral disc degeneration, lumbar region without mention of lumbar back pain or lower extremity pain: Secondary | ICD-10-CM

## 2023-07-18 DIAGNOSIS — Z8719 Personal history of other diseases of the digestive system: Secondary | ICD-10-CM

## 2023-07-18 MED ORDER — PILOCARPINE HCL 5 MG PO TABS: 5.0000 mg | ORAL_TABLET | Freq: Two times a day (BID) | ORAL | 0 refills | Status: AC

## 2023-07-18 NOTE — Patient Instructions (Signed)
Pilocarpine Tablets What is this medication? PILOCARPINE (PYE loe KAR peen) treats dry mouth. It works by increasing the amount of saliva in the mouth, which makes it easier to speak and swallow. This medicine may be used for other purposes; ask your health care provider or pharmacist if you have questions. COMMON BRAND NAME(S): Salagen What should I tell my care team before I take this medication? They need to know if you have any of these conditions: Eye infection or other eye problems Glaucoma Heart disease Liver disease Lung or breathing disease, such as asthma An unusual or allergic reaction to pilocarpine, other medications, foods, dyes, or preservatives Pregnant or trying to get pregnant Breast-feeding How should I use this medication? Take this medication by mouth with a full glass of water. Take it as directed on the prescription label at the same time every day. Keep taking it unless your care team tells you to stop. Talk to your care team about the use of this medication in children. Special care may be needed. Overdosage: If you think you have taken too much of this medicine contact a poison control center or emergency room at once. NOTE: This medicine is only for you. Do not share this medicine with others. What if I miss a dose? If you miss a dose, take it as soon as you can. If it is almost time for your next dose, take only that dose. Do not take double or extra doses. What may interact with this medication? Antihistamines for allergy, cough, and cold Atropine Certain medications for Alzheimer disease, such as donepezil, galantamine, rivastigmine Certain medications for bladder problems, such as bethanechol, oxybutynin, tolterodine Certain medications for Parkinson disease, such as benztropine, trihexyphenidyl Certain medications for quitting smoking, such as nicotine Certain medications for stomach problems, such as dicyclomine, hyoscyamine Certain medications for  travel sickness, such as scopolamine Ipratropium Medications for blood pressure or heart problems, such as metoprolol This list may not describe all possible interactions. Give your health care provider a list of all the medicines, herbs, non-prescription drugs, or dietary supplements you use. Also tell them if you smoke, drink alcohol, or use illegal drugs. Some items may interact with your medicine. What should I watch for while using this medication? Visit your care team for regular checks on your progress. Tell your care team if your symptoms do not get better or if they get worse. You may get blurry vision or have trouble telling how far something is from you. This may be a problem at night or when the lights are low. Do not drive, use machinery, or do anything that needs clear vision until you know how this medication affects you. If you sweat a lot, drink enough to replace fluids. Do not get dehydrated. What side effects may I notice from receiving this medication? Side effects that you should report to your care team as soon as possible: Allergic reactions--skin rash, itching, hives, swelling of the face, lips, tongue, or throat Fast or irregular heartbeat Increase in blood pressure Low blood pressure--dizziness, feeling faint or lightheaded, blurry vision Slow heartbeat--dizziness, feeling faint or lightheaded, confusion, trouble breathing, unusual weakness or fatigue Side effects that usually do not require medical attention (report to your care team if they continue or are bothersome): Change in vision Chills Diarrhea Excessive sweating Flushing Headache Increased need to urinate This list may not describe all possible side effects. Call your doctor for medical advice about side effects. You may report side effects to FDA at 1-800-FDA-1088.   Where should I keep my medication? Keep out of the reach of children and pets. Store at room temperature between 15 and 30 degrees C (59 and  86 degrees F). Get rid of any unused medication after the expiration date. To get rid of medications that are no longer needed or have expired: Take the medications to a medication take-back program. Check with your pharmacy or law enforcement to find a location. If your cannot return the medication, check the label or package insert to see if the medication should be thrown out in the garbage or flushed down the toilet. If you are not sure, ask your care team. If it is safe to put it in the trash, take the medication out of the container. Mix the medication with cat litter, dirt, coffee grounds, or other unwanted substance. Seal the mixture in a bag or container. Put it in the trash. NOTE: This sheet is a summary. It may not cover all possible information. If you have questions about this medicine, talk to your doctor, pharmacist, or health care provider.  2024 Elsevier/Gold Standard (2021-11-17 00:00:00)  

## 2023-08-06 ENCOUNTER — Ambulatory Visit: Payer: PPO | Admitting: Rheumatology

## 2023-08-14 DIAGNOSIS — M810 Age-related osteoporosis without current pathological fracture: Secondary | ICD-10-CM | POA: Diagnosis not present

## 2023-08-14 DIAGNOSIS — K8681 Exocrine pancreatic insufficiency: Secondary | ICD-10-CM | POA: Diagnosis not present

## 2023-08-14 DIAGNOSIS — I10 Essential (primary) hypertension: Secondary | ICD-10-CM | POA: Diagnosis not present

## 2023-08-14 DIAGNOSIS — E663 Overweight: Secondary | ICD-10-CM | POA: Diagnosis not present

## 2023-08-14 DIAGNOSIS — Z6826 Body mass index (BMI) 26.0-26.9, adult: Secondary | ICD-10-CM | POA: Diagnosis not present

## 2023-08-14 DIAGNOSIS — D519 Vitamin B12 deficiency anemia, unspecified: Secondary | ICD-10-CM | POA: Diagnosis not present

## 2023-08-14 DIAGNOSIS — G4739 Other sleep apnea: Secondary | ICD-10-CM | POA: Diagnosis not present

## 2023-08-14 DIAGNOSIS — E039 Hypothyroidism, unspecified: Secondary | ICD-10-CM | POA: Diagnosis not present

## 2023-08-14 DIAGNOSIS — I5032 Chronic diastolic (congestive) heart failure: Secondary | ICD-10-CM | POA: Diagnosis not present

## 2023-08-14 DIAGNOSIS — M1991 Primary osteoarthritis, unspecified site: Secondary | ICD-10-CM | POA: Diagnosis not present

## 2023-08-23 ENCOUNTER — Ambulatory Visit: Payer: PPO | Admitting: Cardiovascular Disease

## 2023-08-26 DIAGNOSIS — D518 Other vitamin B12 deficiency anemias: Secondary | ICD-10-CM | POA: Diagnosis not present

## 2023-08-27 DIAGNOSIS — M25572 Pain in left ankle and joints of left foot: Secondary | ICD-10-CM | POA: Diagnosis not present

## 2023-08-27 DIAGNOSIS — G609 Hereditary and idiopathic neuropathy, unspecified: Secondary | ICD-10-CM | POA: Diagnosis not present

## 2023-08-27 DIAGNOSIS — M79672 Pain in left foot: Secondary | ICD-10-CM | POA: Diagnosis not present

## 2023-08-27 DIAGNOSIS — M5416 Radiculopathy, lumbar region: Secondary | ICD-10-CM | POA: Diagnosis not present

## 2023-08-30 DIAGNOSIS — N390 Urinary tract infection, site not specified: Secondary | ICD-10-CM | POA: Diagnosis not present

## 2023-09-04 DIAGNOSIS — L298 Other pruritus: Secondary | ICD-10-CM | POA: Diagnosis not present

## 2023-09-14 DIAGNOSIS — M5416 Radiculopathy, lumbar region: Secondary | ICD-10-CM | POA: Diagnosis not present

## 2023-09-25 DIAGNOSIS — D518 Other vitamin B12 deficiency anemias: Secondary | ICD-10-CM | POA: Diagnosis not present

## 2023-09-25 DIAGNOSIS — Z23 Encounter for immunization: Secondary | ICD-10-CM | POA: Diagnosis not present

## 2023-10-02 ENCOUNTER — Ambulatory Visit: Payer: PPO | Admitting: Allergy & Immunology

## 2023-10-09 DIAGNOSIS — S76312D Strain of muscle, fascia and tendon of the posterior muscle group at thigh level, left thigh, subsequent encounter: Secondary | ICD-10-CM | POA: Diagnosis not present

## 2023-10-14 DIAGNOSIS — M1712 Unilateral primary osteoarthritis, left knee: Secondary | ICD-10-CM | POA: Diagnosis not present

## 2023-10-14 DIAGNOSIS — M25511 Pain in right shoulder: Secondary | ICD-10-CM | POA: Diagnosis not present

## 2023-10-31 ENCOUNTER — Ambulatory Visit: Payer: PPO | Admitting: Neurology

## 2023-11-06 DIAGNOSIS — R197 Diarrhea, unspecified: Secondary | ICD-10-CM | POA: Diagnosis not present

## 2023-11-06 DIAGNOSIS — K589 Irritable bowel syndrome without diarrhea: Secondary | ICD-10-CM | POA: Diagnosis not present

## 2023-11-06 DIAGNOSIS — M170 Bilateral primary osteoarthritis of knee: Secondary | ICD-10-CM | POA: Diagnosis not present

## 2023-11-20 DIAGNOSIS — M1712 Unilateral primary osteoarthritis, left knee: Secondary | ICD-10-CM | POA: Diagnosis not present

## 2023-11-26 ENCOUNTER — Telehealth: Payer: Self-pay | Admitting: Internal Medicine

## 2023-11-26 DIAGNOSIS — M5416 Radiculopathy, lumbar region: Secondary | ICD-10-CM | POA: Diagnosis not present

## 2023-11-26 NOTE — Telephone Encounter (Signed)
Received referral from [facility] on this patient, but she is well established with [facility] (Dr Sharrell Ku). I sent referral back to PCP telling them that we haven't seen her since 2014 and per Dr Darrick Penna she can not have 2 GI doctors and she needs to stay with Dr Kinnie Scales.

## 2023-11-27 DIAGNOSIS — M1712 Unilateral primary osteoarthritis, left knee: Secondary | ICD-10-CM | POA: Diagnosis not present

## 2023-12-04 DIAGNOSIS — M1712 Unilateral primary osteoarthritis, left knee: Secondary | ICD-10-CM | POA: Diagnosis not present

## 2023-12-19 ENCOUNTER — Encounter (INDEPENDENT_AMBULATORY_CARE_PROVIDER_SITE_OTHER): Payer: Self-pay | Admitting: *Deleted

## 2023-12-25 ENCOUNTER — Encounter: Payer: Self-pay | Admitting: Cardiovascular Disease

## 2023-12-25 ENCOUNTER — Ambulatory Visit: Payer: PPO | Attending: Cardiovascular Disease | Admitting: Cardiovascular Disease

## 2023-12-25 VITALS — BP 146/72 | HR 61 | Ht 62.0 in | Wt 149.6 lb

## 2023-12-25 DIAGNOSIS — I5032 Chronic diastolic (congestive) heart failure: Secondary | ICD-10-CM

## 2023-12-25 DIAGNOSIS — I350 Nonrheumatic aortic (valve) stenosis: Secondary | ICD-10-CM | POA: Diagnosis not present

## 2023-12-25 DIAGNOSIS — I10 Essential (primary) hypertension: Secondary | ICD-10-CM | POA: Diagnosis not present

## 2023-12-25 DIAGNOSIS — E782 Mixed hyperlipidemia: Secondary | ICD-10-CM

## 2023-12-25 NOTE — Assessment & Plan Note (Signed)
 History of hyperlipidemia on Zetia with lipid profile performed 01/08/2023 revealing total cholesterol 246, LDL 163 and HDL of 55.  Given her age I do not feel compelled to begin additional medication.

## 2023-12-25 NOTE — Assessment & Plan Note (Signed)
 History of essential hypertension her blood pressure measured today at 146/72.  She is on amlodipine, atenolol, and Avapro.

## 2023-12-25 NOTE — Assessment & Plan Note (Signed)
 History of chronic diastolic heart failure not on a diuretic.  Her last 2D echo performed 11/03/2018 revealed normal LV systolic function with grade 1 diastolic dysfunction and mild to moderate aortic insufficiency.  She denies shortness of breath.
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
## 2023-12-25 NOTE — Patient Instructions (Signed)

## 2024-01-02 DIAGNOSIS — M5416 Radiculopathy, lumbar region: Secondary | ICD-10-CM | POA: Diagnosis not present

## 2024-01-07 NOTE — Progress Notes (Deleted)
 Office Visit Note  Patient: Jo Parrish             Date of Birth: [birth year]           MRN: [id number]             PCP: Bertell Satterfield, MD Referring: Bertell Satterfield, MD Visit Date: 01/21/2024 Occupation: @GUAROCC @  Subjective:  No chief complaint on file.   History of Present Illness: Jo Parrish is a 89 y.o. female ***     Activities of Daily Living:  Patient reports morning stiffness for *** {minute/hour:19697}.   Patient {ACTIONS;DENIES/REPORTS:21021675::Denies} nocturnal pain.  Difficulty dressing/grooming: {ACTIONS;DENIES/REPORTS:21021675::Denies} Difficulty climbing stairs: {ACTIONS;DENIES/REPORTS:21021675::Denies} Difficulty getting out of chair: {ACTIONS;DENIES/REPORTS:21021675::Denies} Difficulty using hands for taps, buttons, cutlery, and/or writing: {ACTIONS;DENIES/REPORTS:21021675::Denies}  No Rheumatology ROS completed.   PMFS History:  Patient Active Problem List   Diagnosis Date Noted   Mild aortic stenosis 12/12/2018   UI (urinary incontinence) 01/16/2016   Lower urinary tract infectious disease 09/13/2015   Hematuria 09/13/2015   Cough 09/13/2015   Wheezing on auscultation 09/13/2015   Vulvar itching 08/15/2015   Vaginal atrophy 08/02/2015   Urinary frequency 07/04/2015   Vaginal burning 07/04/2015   Vaginal dryness 07/04/2015   Rash, legs 06/21/2015   Constipation 01/07/2014   Anemia 10/20/2013   Heme positive stool 10/20/2013   Abnormal LFTs 10/20/2013   Nausea alone 10/20/2013   Cellulitis of leg, right 10/16/2013   Constipation 10/16/2013   Postoperative anemia due to acute blood loss 10/12/2013   Hyponatremia 10/12/2013   OA (osteoarthritis) of knee 10/09/2013   Heart palpitations 10/07/2013   Hypothyroidism 08/13/2013   Chronic diastolic heart failure (HCC) 08/06/2013   Essential hypertension 08/06/2013   Hyperlipidemia 08/06/2013   Knee pain 03/03/2013    Past Medical History:  Diagnosis Date   Bacterial  overgrowth syndrome    Small bowel   Complication of anesthesia    Constipation    Cough 09/13/2015   GERD (gastroesophageal reflux disease)    Hematuria 09/13/2015   Hyperlipidemia    Hypertension    Hypothyroidism    Left bundle branch block    Neuropathy    OSA (obstructive sleep apnea)    UNABLE TO TOLERATE  MASK   PONV (postoperative nausea and vomiting)    Rheumatoid arthritis(714.0)    Sjogren's syndrome (HCC)    UI (urinary incontinence) 01/16/2016   Ulcer, esophagus    Urinary frequency 07/04/2015   UTI (lower urinary tract infection) 09/13/2015   Vaginal atrophy 08/02/2015   Vaginal burning 07/04/2015   Vaginal dryness 07/04/2015   Vulvar itching 08/15/2015   Wheezing on auscultation 09/13/2015    Family History  Problem Relation Age of Onset   Stroke Mother    Heart disease Father    Liver disease Father    Stroke Maternal Grandmother    COPD Son    Sleep apnea Son    Past Surgical History:  Procedure Laterality Date   APPENDECTOMY     COLONOSCOPY  05/17/2013   Dr. Luis, multiple tubular adenomas removed, internal hemorrhoids. Next colonoscopy in 3 years.   DILATION AND CURETTAGE OF UTERUS     ESOPHAGOGASTRODUODENOSCOPY  06/07/2008    Gastritis. No celiac sprue/Small hiatal hernia   ESOPHAGOGASTRODUODENOSCOPY  05/17/2013   Dr. Luis, erosive esophagitis, grade B., large hiatal hernia.   Hydrogen breath test  05/17/2008   small bowel bacterial overgrowth/Hydrogen level rise consistent with small bowel bacterial   IR SACROPLASTY BILATERAL  12/13/2022   TONSILLECTOMY  TOTAL KNEE ARTHROPLASTY Right 10/09/2013   Procedure: RIGHT TOTAL KNEE ARTHROPLASTY;  Surgeon: Dempsey LULLA Moan, MD;  Location: [facility];  Service: Orthopedics;  Laterality: Right;   TRIGGER FINGER RELEASE Right 06/2023   Dr Camella   Social History   Social History Narrative   Lives alone   Right Handed   Drinks 1-2 cups caffeine daily      Are you currently employed ?    What is  your current occupation?retired   Do you live at home alone?yes   Who lives with you?    What type of home do you live in: 1 story or 2 story? one    And Drives Fast   Immunization History  Administered Date(s) Administered   Moderna Sars-Covid-2 Vaccination 01/08/2020, 02/03/2020, 10/29/2020     Objective: Vital Signs: There were no vitals taken for this visit.   Physical Exam   Musculoskeletal Exam: ***  CDAI Exam: CDAI Score: -- Patient Global: --; Provider Global: -- Swollen: --; Tender: -- Joint Exam 01/21/2024   No joint exam has been documented for this visit   There is currently no information documented on the homunculus. Go to the Rheumatology activity and complete the homunculus joint exam.  Investigation: No additional findings.  Imaging: No results found.  Recent Labs: Lab Results  Component Value Date   WBC 5.2 12/13/2022   HGB 13.3 12/13/2022   PLT 222 12/13/2022   NA 140 12/13/2022   K 4.0 12/13/2022   CL 106 12/13/2022   CO2 26 12/13/2022   GLUCOSE 103 (H) 12/13/2022   BUN 19 12/13/2022   CREATININE 0.89 12/13/2022   BILITOT 0.6 06/29/2021   ALKPHOS 40 06/29/2021   AST 22 06/29/2021   ALT 20 06/29/2021   PROT 7.0 06/29/2021   ALBUMIN 4.2 06/29/2021   CALCIUM 9.5 12/13/2022   GFRAA 58 (L) 06/11/2018    Speciality Comments: Reclast  2017-2022  Procedures:  No procedures performed Allergies: Dicyclomine, Amoxicillin, Cymbalta  [duloxetine  hcl], Doxycycline, Gabapentin, Levaquin [levofloxacin in d5w], Lyrica  [pregabalin ], Statins, Sucralfate, Sulfa antibiotics, Fenofibrate  micronized, and Tizanidine   Assessment / Plan:     Visit Diagnoses: No diagnosis found.  Orders: No orders of the defined types were placed in this encounter.  No orders of the defined types were placed in this encounter.   Face-to-face time spent with patient was *** minutes. Greater than 50% of time was spent in counseling and coordination of care.  Follow-Up  Instructions: No follow-ups on file.   Daved JAYSON Gavel, CMA  Note - This record has been created using Animal nutritionist.  Chart creation errors have been sought, but may not always  have been located. Such creation errors do not reflect on  the standard of medical care.

## 2024-01-16 DIAGNOSIS — M539 Dorsopathy, unspecified: Secondary | ICD-10-CM | POA: Diagnosis not present

## 2024-01-16 DIAGNOSIS — D518 Other vitamin B12 deficiency anemias: Secondary | ICD-10-CM | POA: Diagnosis not present

## 2024-01-16 DIAGNOSIS — M792 Neuralgia and neuritis, unspecified: Secondary | ICD-10-CM | POA: Diagnosis not present

## 2024-01-16 DIAGNOSIS — G90523 Complex regional pain syndrome I of lower limb, bilateral: Secondary | ICD-10-CM | POA: Diagnosis not present

## 2024-01-16 DIAGNOSIS — M5416 Radiculopathy, lumbar region: Secondary | ICD-10-CM | POA: Diagnosis not present

## 2024-01-16 DIAGNOSIS — M51369 Other intervertebral disc degeneration, lumbar region without mention of lumbar back pain or lower extremity pain: Secondary | ICD-10-CM | POA: Diagnosis not present

## 2024-01-21 ENCOUNTER — Encounter (INDEPENDENT_AMBULATORY_CARE_PROVIDER_SITE_OTHER): Payer: Self-pay | Admitting: Gastroenterology

## 2024-01-21 ENCOUNTER — Ambulatory Visit (INDEPENDENT_AMBULATORY_CARE_PROVIDER_SITE_OTHER): Payer: PPO | Admitting: Gastroenterology

## 2024-01-21 ENCOUNTER — Ambulatory Visit: Payer: PPO | Admitting: Rheumatology

## 2024-01-21 VITALS — BP 134/71 | HR 72 | Temp 97.5°F | Ht 62.0 in | Wt 151.6 lb

## 2024-01-21 DIAGNOSIS — K5909 Other constipation: Secondary | ICD-10-CM | POA: Diagnosis not present

## 2024-01-21 DIAGNOSIS — Z860100 Personal history of colon polyps, unspecified: Secondary | ICD-10-CM | POA: Diagnosis not present

## 2024-01-21 DIAGNOSIS — Z96651 Presence of right artificial knee joint: Secondary | ICD-10-CM

## 2024-01-21 DIAGNOSIS — E034 Atrophy of thyroid (acquired): Secondary | ICD-10-CM

## 2024-01-21 DIAGNOSIS — M19012 Primary osteoarthritis, left shoulder: Secondary | ICD-10-CM

## 2024-01-21 DIAGNOSIS — K2100 Gastro-esophageal reflux disease with esophagitis, without bleeding: Secondary | ICD-10-CM | POA: Insufficient documentation

## 2024-01-21 DIAGNOSIS — Z8719 Personal history of other diseases of the digestive system: Secondary | ICD-10-CM

## 2024-01-21 DIAGNOSIS — M810 Age-related osteoporosis without current pathological fracture: Secondary | ICD-10-CM

## 2024-01-21 DIAGNOSIS — G629 Polyneuropathy, unspecified: Secondary | ICD-10-CM

## 2024-01-21 DIAGNOSIS — K5904 Chronic idiopathic constipation: Secondary | ICD-10-CM

## 2024-01-21 DIAGNOSIS — E538 Deficiency of other specified B group vitamins: Secondary | ICD-10-CM

## 2024-01-21 DIAGNOSIS — G8929 Other chronic pain: Secondary | ICD-10-CM

## 2024-01-21 DIAGNOSIS — Z8639 Personal history of other endocrine, nutritional and metabolic disease: Secondary | ICD-10-CM

## 2024-01-21 DIAGNOSIS — M797 Fibromyalgia: Secondary | ICD-10-CM

## 2024-01-21 DIAGNOSIS — M47816 Spondylosis without myelopathy or radiculopathy, lumbar region: Secondary | ICD-10-CM

## 2024-01-21 DIAGNOSIS — M3500 Sicca syndrome, unspecified: Secondary | ICD-10-CM

## 2024-01-21 DIAGNOSIS — G894 Chronic pain syndrome: Secondary | ICD-10-CM

## 2024-01-21 DIAGNOSIS — I5032 Chronic diastolic (congestive) heart failure: Secondary | ICD-10-CM

## 2024-01-21 DIAGNOSIS — I10 Essential (primary) hypertension: Secondary | ICD-10-CM

## 2024-01-21 DIAGNOSIS — I350 Nonrheumatic aortic (valve) stenosis: Secondary | ICD-10-CM

## 2024-01-21 MED ORDER — LINACLOTIDE 145 MCG PO CAPS: 145.0000 ug | ORAL_CAPSULE | Freq: Every day | ORAL | 1 refills | Status: DC

## 2024-01-21 MED ORDER — PANTOPRAZOLE SODIUM 40 MG PO TBEC: 40.0000 mg | DELAYED_RELEASE_TABLET | Freq: Every day | ORAL | 1 refills | Status: DC

## 2024-01-21 NOTE — Patient Instructions (Signed)
 It was very nice to meet you today, as dicussed with will plan for the following :  1) Linzess  works best when taken once a day every day, on an empty stomach, at least 30 minutes before your first meal of the day.  When Linzess  is taken daily as directed:  *Constipation relief is typically felt in about a week  Diarrhea, nausea or abdominal cramping may occur in the first 2 weeks -keep taking it.  These symptoms should eventually resolve. Please notify us  if having more than 4 watery bowel movements per day or fecal soiling accidents.  Start linzess  145mcg

## 2024-01-21 NOTE — Progress Notes (Signed)
 Jo Parrish , M.D. Gastroenterology & Hepatology [facility] [address] [place], [place] [address] Primary Care Physician: Bertell Satterfield, MD [address] [place] [address]  Chief Complaint:  Chronic constipation , GERD with esophagitis   History of Present Illness: Jo Parrish is a 89 y.o. female with OA, diastolic heart failure , HTN, OSA, polyneuropathy on chronic opiods  , Sjogren syndrome, hypothyroidism who presents for evaluation of Chronic constipation and  GERD with esophagitis   Patient was established patient at [facility] last seen 01/2023 and now establishing care, patient was followed up for chronic idiopathic constipation.  Patient has previously failed trial of MiraLAX  and Dulcolax and also Linzess .  Was prescribed Trulance  3 mg once daily on 2024 which patient never tried   Today patient reports she is having 1 bowel movement a day Bristol stool scale 1-2 with some straining after taking is Linzess  72 mcg .  Patient have stopped taking hydroxycodone and her pain has worsened.  She would like to restart opioids for pain control.The patient denies having any nausea, vomiting, fever, chills, hematochezia, melena, hematemesis,  diarrhea, jaundice, pruritus or weight loss.  Last [id number] Grade B erosive esophagitis  Large HH  Last Colonoscopy:2014 by Dr Kemper Multiple large polyps removed Repeat 3 years  FHx: neg for any gastrointestinal/liver disease, no malignancies Social: neg smoking, alcohol or illicit drug use Surgical: Appendectomy  Most recent lab work from 01/07/2023 with normal liver enzymes hemoglobin 14.1 platelet 214 TSH 3.7  Past Medical History: Past Medical History:  Diagnosis Date   Bacterial overgrowth syndrome    Small bowel   Complication of anesthesia    Constipation    Cough 09/13/2015   GERD (gastroesophageal reflux disease)    Hematuria 09/13/2015   Hyperlipidemia     Hypertension    Hypothyroidism    Left bundle branch block    Neuropathy    OSA (obstructive sleep apnea)    UNABLE TO TOLERATE  MASK   PONV (postoperative nausea and vomiting)    Rheumatoid arthritis(714.0)    Sjogren's syndrome (HCC)    UI (urinary incontinence) 01/16/2016   Ulcer, esophagus    Urinary frequency 07/04/2015   UTI (lower urinary tract infection) 09/13/2015   Vaginal atrophy 08/02/2015   Vaginal burning 07/04/2015   Vaginal dryness 07/04/2015   Vulvar itching 08/15/2015   Wheezing on auscultation 09/13/2015    Past Surgical History: Past Surgical History:  Procedure Laterality Date   APPENDECTOMY     COLONOSCOPY  05/17/2013   Dr. Luis, multiple tubular adenomas removed, internal hemorrhoids. Next colonoscopy in 3 years.   DILATION AND CURETTAGE OF UTERUS     ESOPHAGOGASTRODUODENOSCOPY  06/07/2008    Gastritis. No celiac sprue/Small hiatal hernia   ESOPHAGOGASTRODUODENOSCOPY  05/17/2013   Dr. Luis, erosive esophagitis, grade B., large hiatal hernia.   Hydrogen breath test  05/17/2008   small bowel bacterial overgrowth/Hydrogen level rise consistent with small bowel bacterial   IR SACROPLASTY BILATERAL  12/13/2022   TONSILLECTOMY     TOTAL KNEE ARTHROPLASTY Right 10/09/2013   Procedure: RIGHT TOTAL KNEE ARTHROPLASTY;  Surgeon: Dempsey LULLA Moan, MD;  Location: [facility];  Service: Orthopedics;  Laterality: Right;   TRIGGER FINGER RELEASE Right 06/2023   Dr Camella    Family History: Family History  Problem Relation Age of Onset   Stroke Mother    Heart disease Father    Liver disease Father    Stroke Maternal Grandmother  COPD Son    Sleep apnea Son     Social History: Social History   Tobacco Use  Smoking Status Never   Passive exposure: Never  Smokeless Tobacco Never   Social History   Substance and Sexual Activity  Alcohol Use No   Social History   Substance and Sexual Activity  Drug Use No    Allergies: Allergies  Allergen  Reactions   Dicyclomine Swelling    Patient reported tongue swelling when taking   Amoxicillin     Mouth sores   Cymbalta  [Duloxetine  Hcl]    Doxycycline     Mouth sores   Gabapentin     Mouth sores   Levaquin [Levofloxacin In D5w]     Mouth sores   Lyrica  [Pregabalin ]     Mouth sores   Statins Other (See Comments)    Leg problems   Sucralfate     Sore mouth   Sulfa Antibiotics Other (See Comments)    Mouth sores   Fenofibrate  Micronized Other (See Comments)    unknown   Tizanidine Other (See Comments)    Dry mouth, slurred speech    Medications: Current Outpatient Medications  Medication Sig Dispense Refill   LINZESS  72 MCG capsule Take 72 mcg by mouth daily.     acetaminophen  (TYLENOL ) 500 MG tablet Take 1,000 mg by mouth every 6 (six) hours as needed for moderate pain.     amLODipine  (NORVASC ) 10 MG tablet Take 1 tablet (10 mg total) by mouth daily. 90 tablet 3   aspirin  81 MG tablet Take 81 mg by mouth daily.     atenolol  (TENORMIN ) 25 MG tablet Take 1 tablet (25 mg total) by mouth daily. 90 tablet 3   Biotin (BIOTIN 5000) 5 MG CAPS Take 5 mg by mouth daily.     cholecalciferol (VITAMIN D3) 25 MCG (1000 UNIT) tablet Take 1,000 Units by mouth daily.     Cyanocobalamin  (VITAMIN B-12 IJ) Inject 1 Dose as directed every 30 (thirty) days.     cyanocobalamin  (VITAMIN B12) 1000 MCG tablet Take 1,000 mcg by mouth daily.     DULoxetine  (CYMBALTA ) 30 MG capsule Take 1 capsule (30 mg total) by mouth daily. 30 capsule 5   ezetimibe  (ZETIA ) 10 MG tablet Take 1 tablet (10 mg total) by mouth at bedtime. 90 tablet 3   fluticasone (FLONASE) 50 MCG/ACT nasal spray Place 2 sprays into both nostrils daily as needed for allergies.     hydrocortisone (ANUSOL-HC) 25 MG suppository Place rectally.     irbesartan  (AVAPRO ) 300 MG tablet Take 1 tablet (300 mg total) by mouth daily. 90 tablet 3   levothyroxine  (SYNTHROID ) 88 MCG tablet Take 88 mcg by mouth daily.     pantoprazole  (PROTONIX ) 40 MG  tablet Take 40 mg by mouth daily.     Polyethyl Glycol-Propyl Glycol (SYSTANE OP) Place 1 drop into both eyes daily as needed (dry eyes).     polyethylene glycol powder (GLYCOLAX /MIRALAX ) 17 GM/SCOOP powder Take 17 g by mouth daily.     No current facility-administered medications for this visit.    Review of Systems: GENERAL: negative for malaise, night sweats HEENT: No changes in hearing or vision, no nose bleeds or other nasal problems. NECK: Negative for lumps, goiter, pain and significant neck swelling RESPIRATORY: Negative for cough, wheezing CARDIOVASCULAR: Negative for chest pain, leg swelling, palpitations, orthopnea GI: SEE HPI MUSCULOSKELETAL: Negative for joint pain or swelling, back pain, and muscle pain. SKIN: Negative for lesions, rash HEMATOLOGY  Negative for prolonged bleeding, bruising easily, and swollen nodes. ENDOCRINE: Negative for cold or heat intolerance, polyuria, polydipsia and goiter. NEURO: negative for tremor, gait imbalance, syncope and seizures. The remainder of the review of systems is noncontributory.   Physical Exam: BP 134/71 (BP Location: Left Arm, Patient Position: Sitting, Cuff Size: Normal)   Pulse 72   Temp (!) 97.5 F (36.4 C) (Temporal)   Ht 5' 2 (1.575 m)   Wt 151 lb 9.6 oz (68.8 kg)   BMI 27.73 kg/m  GENERAL: The patient is AO x3, in no acute distress. HEENT: Head is normocephalic and atraumatic. EOMI are intact. Mouth is well hydrated and without lesions. NECK: Supple. No masses LUNGS: Clear to auscultation. No presence of rhonchi/wheezing/rales. Adequate chest expansion HEART: RRR, normal s1 and s2. ABDOMEN: Soft, nontender, no guarding, no peritoneal signs, and nondistended. BS +. No masses.  Imaging/Labs: as above     Latest Ref Rng & Units 12/13/2022    9:48 AM 06/29/2021   10:37 PM 06/11/2018    3:27 PM  CBC  WBC 4.0 - 10.5 K/uL 5.2  5.5  5.4   Hemoglobin 12.0 - 15.0 g/dL 86.6  87.1  87.0   Hematocrit 36.0 - 46.0 % 38.8   37.4  38.3   Platelets 150 - 400 K/uL 222  206  191    No results found for: IRON, TIBC, FERRITIN  I personally reviewed and interpreted the available labs, imaging and endoscopic files.  CT Abdomen and pelvis 2023  IMPRESSION:  1. Small hiatal hernia with increased moderate circumferential  gastric thickening. Probable gastritis. Endoscopy may be indicated  but no penetrating ulcer, adjacent inflammation or adenopathy are  observed.  2. Constipation and diverticulosis. No bowel obstruction or  inflammatory change.  3. Coronary artery and aortic atherosclerosis.  4. Osteopenia and degenerative change.  5. Evidence of pelvic floor relaxation but no bladder prolapse.    Impression and Plan:  SALLEE HOGREFE is a 89 y.o. female with OA, diastolic heart failure , HTN, OSA, polyneuropathy on chronic opiods  , Sjogren syndrome, hypothyroidism who presents for evaluation of Chronic constipation and  GERD with esophagitis   #Constipation   This is likely opioid-induced constipation as patient is chronically on opiods for neuropathty   Patient on pregabalin  , calcium channel blocker  (amlodipine  ) which can also exacerbate constipation  Patient was previously seen at [facility] last seen 01/2023 for chronicconstipation.  Patient has previously failed trial of MiraLAX  and Dulcolax and as per notes also Linzess .  Was prescribed Trulance  3 mg once daily on 2024 which patient never tried   Currently patient did responded to Linzess  72 mcg.   We will increase Linzess  145 mcg so that patient can restart opioids for pain control.  If inadequate response we will go to a dose of 290 mcg  If despite this patient does not improve may need to add Trulance  as she was previously approved for it   Recommendations:  Linzess  increased to 145mcg Take Miralax  twice a day for the first week, then reduce to once daily thereafter. Please check TSH with next blood work   #GERD with esophagitis    Patient has history of LA class B esophagitis in setting of large hiatal hernia, last EGD 2014   -Continue pantoprazole  40 mg BID -Continue anti-reflux measures  Previous CT with gastric wall thickening could be gastritis or large hiatal hernia but malignancy cannot be ruled out without endoscopy.  Patient does have  history of large colon polyps, last colonoscopy 2014 suggest repeat was 3 years.  We discussed risk indication, limitation of endoscopic evaluation ( EGD and Colonoscopy) and given advance age and multiple comorbidities risk outweighs benefit and patient would like to defer any endoscopic evaluation at this time .  All questions were answered.      Jo Ranes Faizan Allayna Erlich, MD Gastroenterology and Hepatology [facility]   This chart has been completed using [facility] Dictation software, and while attempts have been made to ensure accuracy , certain words and phrases may not be transcribed as intended

## 2024-01-23 ENCOUNTER — Telehealth (INDEPENDENT_AMBULATORY_CARE_PROVIDER_SITE_OTHER): Payer: Self-pay | Admitting: *Deleted

## 2024-01-23 DIAGNOSIS — K5904 Chronic idiopathic constipation: Secondary | ICD-10-CM

## 2024-01-23 NOTE — Telephone Encounter (Signed)
 Taking two linzess  72 mcg at night with her hydrocodone. The 145 mcg is $117 she has not picked up because she wanted to see how the med was going to work. She states she is not having any pain. Woke up at 4am with stomach bubbling. Drunk a glass of prune juice and that did not help. Then at 5am took 3 capfuls of miralax . And had BM. She said she would be having diarrhea for the first week but she said when she took Tuesday night she had normal one bm Wednesday after taking prune juice and apple sauce. She thinks she needs something stronger because she is afraid she will get impacted.   [phone]

## 2024-01-23 NOTE — Telephone Encounter (Signed)
 Discussed with patient per Dr. Alita Irwin -  she can take 2 pills of linzess  72mcg daily together . If that doesn't help she can go up to 3 pills at one time , as maximum dose is 290mcg    Patient verbalized understanding.

## 2024-01-23 NOTE — Telephone Encounter (Signed)
 Hi Jo Parrish , she can take 2 pills of linzess  72mcg daily together . If that doesn't help she can go up to 3 pills at one time , as maximum dose is 290mcg

## 2024-01-24 NOTE — Telephone Encounter (Signed)
 Patient called and states she was taking 2 linzess  72 mcg, and increased to 3 per day last night. Woke up at 3:25 am and felt like she had to go to bathroom, had a lot of gas and bloating, had a very small amount of stool, she drank some juice and took 3 capfuls of miralax . She said she was thinking back and remembers trying linzess  in the past and it did not work and with the cost of the med she is going to call Dr. Bonner and tell him she is going to stop hydrocodone and go back to tramadol  and stop linzess  and go back to her previous regimen of taking 3 capfuls of miralax  since it was working well for her before taking hydrocodone.

## 2024-01-24 NOTE — Telephone Encounter (Signed)
 error

## 2024-01-27 ENCOUNTER — Other Ambulatory Visit (INDEPENDENT_AMBULATORY_CARE_PROVIDER_SITE_OTHER): Payer: Self-pay | Admitting: *Deleted

## 2024-01-27 DIAGNOSIS — K5904 Chronic idiopathic constipation: Secondary | ICD-10-CM

## 2024-01-27 MED ORDER — TRULANCE 3 MG PO TABS: 3.0000 mg | ORAL_TABLET | Freq: Every day | ORAL | 5 refills | Status: DC

## 2024-01-27 NOTE — Telephone Encounter (Signed)
 Patient called back and states she has been in pain since stopping hydrocodone ( hip and leg pain )  but went back on tramadol  since taking 3 linzess   per day was not helping and she was concerned about getting impacted. She said over weekend after stopping hydrocodone and linzess  her pain was an 8 out of 10 and she is wanting to go back on hydrocodone and take something different for constipation. She is currently takin g1 capful of miralax  2-3 times per day with meals. She said her bms have gotten better since stopping hydorocone but the pain is too bad and needs to go back on it.

## 2024-01-27 NOTE — Telephone Encounter (Signed)
 Thank you for taking to Ms Jo Parrish  This is what I recommend:  1) take miralax  3 times daily 2) Linzess  2-3 tablets of 72mcg daily  3) I am prescribing her Trulance  3mg  to be taken once daily as she was previoulsy approved for it at [facility]   If she continues to have symptoms please ask her to see us  in clinic

## 2024-01-27 NOTE — Telephone Encounter (Signed)
 Discussed with patient per Dr. Alita Irwin -     1) take miralax  3 times daily 2) Linzess  2-3 tablets of 72mcg daily   3) I am prescribing her Trulance  3mg  to be taken once daily as she was previoulsy approved for it at [facility]    If she continues to have symptoms please ask her to see us  in clinic       Patient verbalized understanding.

## 2024-01-28 DIAGNOSIS — E039 Hypothyroidism, unspecified: Secondary | ICD-10-CM | POA: Diagnosis not present

## 2024-01-28 DIAGNOSIS — Z6826 Body mass index (BMI) 26.0-26.9, adult: Secondary | ICD-10-CM | POA: Diagnosis not present

## 2024-01-28 DIAGNOSIS — Z0001 Encounter for general adult medical examination with abnormal findings: Secondary | ICD-10-CM | POA: Diagnosis not present

## 2024-01-28 DIAGNOSIS — M1991 Primary osteoarthritis, unspecified site: Secondary | ICD-10-CM | POA: Diagnosis not present

## 2024-01-28 DIAGNOSIS — I10 Essential (primary) hypertension: Secondary | ICD-10-CM | POA: Diagnosis not present

## 2024-01-28 DIAGNOSIS — Z1331 Encounter for screening for depression: Secondary | ICD-10-CM | POA: Diagnosis not present

## 2024-01-28 DIAGNOSIS — M810 Age-related osteoporosis without current pathological fracture: Secondary | ICD-10-CM | POA: Diagnosis not present

## 2024-01-28 DIAGNOSIS — E663 Overweight: Secondary | ICD-10-CM | POA: Diagnosis not present

## 2024-01-28 DIAGNOSIS — G9332 Myalgic encephalomyelitis/chronic fatigue syndrome: Secondary | ICD-10-CM | POA: Diagnosis not present

## 2024-01-28 DIAGNOSIS — D518 Other vitamin B12 deficiency anemias: Secondary | ICD-10-CM | POA: Diagnosis not present

## 2024-01-28 DIAGNOSIS — I5032 Chronic diastolic (congestive) heart failure: Secondary | ICD-10-CM | POA: Diagnosis not present

## 2024-01-28 DIAGNOSIS — K8681 Exocrine pancreatic insufficiency: Secondary | ICD-10-CM | POA: Diagnosis not present

## 2024-01-28 DIAGNOSIS — M5416 Radiculopathy, lumbar region: Secondary | ICD-10-CM | POA: Diagnosis not present

## 2024-01-28 DIAGNOSIS — G4739 Other sleep apnea: Secondary | ICD-10-CM | POA: Diagnosis not present

## 2024-01-28 DIAGNOSIS — E559 Vitamin D deficiency, unspecified: Secondary | ICD-10-CM | POA: Diagnosis not present

## 2024-02-08 ENCOUNTER — Other Ambulatory Visit: Payer: Self-pay

## 2024-02-08 ENCOUNTER — Ambulatory Visit
Admission: EM | Admit: 2024-02-08 | Discharge: 2024-02-08 | Disposition: A | Discharge status: Home / Self Care | Payer: HMO

## 2024-02-08 ENCOUNTER — Encounter: Payer: Self-pay | Admitting: Emergency Medicine

## 2024-02-08 DIAGNOSIS — R450 Nervousness: Secondary | ICD-10-CM

## 2024-02-08 DIAGNOSIS — T887XXA Unspecified adverse effect of drug or medicament, initial encounter: Secondary | ICD-10-CM | POA: Diagnosis not present

## 2024-02-08 DIAGNOSIS — R030 Elevated blood-pressure reading, without diagnosis of hypertension: Secondary | ICD-10-CM

## 2024-02-08 NOTE — ED Triage Notes (Addendum)
 Pt reports "I have been trying to get off tramadol and tell my doctor I can't take it anymore." Pt reports anxiety, nervousness, nausea, dry mouth x5 days. Reports has taken a half dose last night. Pt reports was px tramadol for back pain.

## 2024-02-08 NOTE — Discharge Instructions (Signed)
 Stop the tramadol and follow-up as soon as possible with your primary care provider

## 2024-02-12 NOTE — ED Provider Notes (Signed)
 [facility]    CSN: [id number] Arrival date & time: 02/08/24  1403      History   Chief Complaint Chief Complaint  Patient presents with   Anxiety    HPI Jo Parrish is a 89 y.o. female.   Patient presenting today with several day history of anxiousness, dry mouth that she attributes to her tramadol.  She states she is prescribed tramadol by the orthopedist and she has been taking it for about a week and a half and feels that every time she takes a dose she gets significantly nervous, unable to sleep and has dry mouth.  Called orthopedist and was told that they did not feel that the symptoms were related to her medication but she states that is the only thing that is new.  Denies suicidal or homicidal ideation, chest pain, shortness of breath.  Has not tried a trial off the medication thus far.    Past Medical History:  Diagnosis Date   Bacterial overgrowth syndrome    Small bowel   Complication of anesthesia    Constipation    Cough 09/13/2015   GERD (gastroesophageal reflux disease)    Hematuria 09/13/2015   Hyperlipidemia    Hypertension    Hypothyroidism    Left bundle branch block    Neuropathy    OSA (obstructive sleep apnea)    UNABLE TO TOLERATE  MASK   PONV (postoperative nausea and vomiting)    Rheumatoid arthritis(714.0)    Sjogren's syndrome (HCC)    UI (urinary incontinence) 01/16/2016   Ulcer, esophagus    Urinary frequency 07/04/2015   UTI (lower urinary tract infection) 09/13/2015   Vaginal atrophy 08/02/2015   Vaginal burning 07/04/2015   Vaginal dryness 07/04/2015   Vulvar itching 08/15/2015   Wheezing on auscultation 09/13/2015    Patient Active Problem List   Diagnosis Date Noted   Gastroesophageal reflux disease with esophagitis without hemorrhage 01/21/2024   History of colonic polyps 01/21/2024   Mild aortic stenosis 12/12/2018   UI (urinary incontinence) 01/16/2016   Lower urinary tract infectious disease 09/13/2015    Hematuria 09/13/2015   Cough 09/13/2015   Wheezing on auscultation 09/13/2015   Vulvar itching 08/15/2015   Vaginal atrophy 08/02/2015   Urinary frequency 07/04/2015   Vaginal burning 07/04/2015   Vaginal dryness 07/04/2015   Rash, legs 06/21/2015   Constipation 01/07/2014   Anemia 10/20/2013   Heme positive stool 10/20/2013   Abnormal LFTs 10/20/2013   Nausea alone 10/20/2013   Cellulitis of leg, right 10/16/2013   Constipation 10/16/2013   Postoperative anemia due to acute blood loss 10/12/2013   Hyponatremia 10/12/2013   OA (osteoarthritis) of knee 10/09/2013   Heart palpitations 10/07/2013   Hypothyroidism 08/13/2013   Chronic diastolic heart failure (HCC) 08/06/2013   Essential hypertension 08/06/2013   Hyperlipidemia 08/06/2013   Knee pain 03/03/2013    Past Surgical History:  Procedure Laterality Date   APPENDECTOMY     COLONOSCOPY  05/17/2013   Dr. Kinnie Scales, multiple tubular adenomas removed, internal hemorrhoids. Next colonoscopy in 3 years.   DILATION AND CURETTAGE OF UTERUS     ESOPHAGOGASTRODUODENOSCOPY  06/07/2008    Gastritis. No celiac sprue/Small hiatal hernia   ESOPHAGOGASTRODUODENOSCOPY  05/17/2013   Dr. Kinnie Scales, erosive esophagitis, grade B., large hiatal hernia.   Hydrogen breath test  05/17/2008   small bowel bacterial overgrowth/Hydrogen level rise consistent with small bowel bacterial   IR SACROPLASTY BILATERAL  12/13/2022   TONSILLECTOMY     TOTAL  KNEE ARTHROPLASTY Right 10/09/2013   Procedure: RIGHT TOTAL KNEE ARTHROPLASTY;  Surgeon: Loanne Drilling, MD;  Location: [facility];  Service: Orthopedics;  Laterality: Right;   TRIGGER FINGER RELEASE Right 06/2023   Dr Amanda Pea    OB History     Gravida  1   Para  1   Term  1   Preterm      AB      Living  1      SAB      IAB      Ectopic      Multiple      Live Births  1            Home Medications    Prior to Admission medications   Medication Sig Start Date End Date Taking?  Authorizing Provider  acetaminophen (TYLENOL) 500 MG tablet Take 1,000 mg by mouth every 6 (six) hours as needed for moderate pain.    [provider]  ALPHA LIPOIC ACID PO Take by mouth. 600 mg daily    [provider]  amLODipine (NORVASC) 10 MG tablet Take 1 tablet (10 mg total) by mouth daily. 02/18/23   Flossie Dibble, NP  aspirin 81 MG tablet Take 81 mg by mouth daily.    [provider]  atenolol (TENORMIN) 25 MG tablet Take 1 tablet (25 mg total) by mouth daily. 02/18/23   Flossie Dibble, NP  Biotin (BIOTIN 5000) 5 MG CAPS Take 5 mg by mouth daily.    [provider]  cholecalciferol (VITAMIN D3) 25 MCG (1000 UNIT) tablet Take 1,000 Units by mouth daily.    [provider]  Cyanocobalamin (VITAMIN B-12 IJ) Inject 1 Dose as directed every 30 (thirty) days.    [provider]  cyanocobalamin (VITAMIN B12) 1000 MCG tablet Take 5,000 mcg by mouth daily.    [provider]  diazepam (VALIUM) 10 MG tablet Take 10 mg by mouth every 6 (six) hours as needed for anxiety. 1/4 -1/2 of tablet two to three times per week.    [provider]  ezetimibe (ZETIA) 10 MG tablet Take 1 tablet (10 mg total) by mouth at bedtime. 02/18/23   Flossie Dibble, NP  fluticasone (FLONASE) 50 MCG/ACT nasal spray Place 2 sprays into both nostrils daily as needed for allergies.    [provider]  hydrocortisone (ANUSOL-HC) 25 MG suppository Place rectally. Patient not taking: Reported on 01/21/2024 01/18/23   [provider]  irbesartan (AVAPRO) 300 MG tablet Take 1 tablet (300 mg total) by mouth daily. 02/18/23   Flossie Dibble, NP  levothyroxine (SYNTHROID) 88 MCG tablet Take 88 mcg by mouth daily. 02/21/20   [provider]  linaclotide (LINZESS) 145 MCG CAPS capsule Take 1 capsule (145 mcg total) by mouth daily. 01/21/24 07/19/24  Franky Macho, MD  pantoprazole (PROTONIX) 40 MG tablet Take 1 tablet (40 mg total) by mouth  daily. 01/21/24 07/19/24  Franky Macho, MD  Plecanatide (TRULANCE) 3 MG TABS Take 1 tablet (3 mg total) by mouth daily. 01/27/24 07/25/24  Franky Macho, MD  Polyethyl Glycol-Propyl Glycol (SYSTANE OP) Place 1 drop into both eyes daily as needed (dry eyes).    [provider]  traMADol (ULTRAM) 50 MG tablet Take by mouth at bedtime.    [provider]    Family History Family History  Problem Relation Age of Onset   Stroke Mother    Heart disease Father  Liver disease Father    Stroke Maternal Grandmother    COPD Son    Sleep apnea Son     Social History Social History   Tobacco Use   Smoking status: Never    Passive exposure: Never   Smokeless tobacco: Never  Vaping Use   Vaping status: Never Used  Substance Use Topics   Alcohol use: No   Drug use: No     Allergies   Dicyclomine, Amoxicillin, Cymbalta [duloxetine hcl], Doxycycline, Gabapentin, Levaquin [levofloxacin in d5w], Lyrica [pregabalin], Prednisone, Statins, Sucralfate, Sulfa antibiotics, Fenofibrate micronized, and Tizanidine   Review of Systems Review of Systems Per HPI  Physical Exam Triage Vital Signs ED Triage Vitals  Encounter Vitals Group     BP 02/08/24 1426 (!) 144/83     Systolic BP Percentile --      Diastolic BP Percentile --      Pulse Rate 02/08/24 1426 73     Resp 02/08/24 1426 20     Temp 02/08/24 1426 98.6 F (37 C)     Temp Source 02/08/24 1426 Oral     SpO2 02/08/24 1426 93 %     Weight --      Height --      Head Circumference --      Peak Flow --      Pain Score 02/08/24 1423 0     Pain Loc --      Pain Education --      Exclude from Growth Chart --    No data found.  Updated Vital Signs BP (!) 144/83 (BP Location: Right Arm)   Pulse 73   Temp 98.6 F (37 C) (Oral)   Resp 20   SpO2 93%   Visual Acuity Right Eye Distance:   Left Eye Distance:   Bilateral Distance:    Right Eye Near:   Left Eye Near:    Bilateral Near:     Physical  Exam Vitals and nursing note reviewed.  Constitutional:      Appearance: Normal appearance. She is not ill-appearing.  HENT:     Head: Atraumatic.     Mouth/Throat:     Mouth: Mucous membranes are moist.  Eyes:     Extraocular Movements: Extraocular movements intact.     Conjunctiva/sclera: Conjunctivae normal.  Cardiovascular:     Rate and Rhythm: Normal rate and regular rhythm.     Heart sounds: Normal heart sounds.  Pulmonary:     Effort: Pulmonary effort is normal.     Breath sounds: Normal breath sounds.  Musculoskeletal:        General: Normal range of motion.     Cervical back: Normal range of motion and neck supple.  Skin:    General: Skin is warm and dry.  Neurological:     Mental Status: She is alert. Mental status is at baseline.     Motor: No weakness.     Gait: Gait normal.  Psychiatric:     Comments: Appears anxious      UC Treatments / Results  Labs (all labs ordered are listed, but only abnormal results are displayed) Labs Reviewed - No data to display  EKG   Radiology No results found.  Procedures Procedures (including critical care time)  Medications Ordered in UC Medications - No data to display  Initial Impression / Assessment and Plan / UC Course  I have reviewed the triage vital signs and the nursing notes.  Pertinent labs & imaging results that were  available during my care of the patient were reviewed by me and considered in my medical decision making (see chart for details).     Mildly hypertensive in triage, otherwise vital signs today are reassuring.  She is overall well-appearing and in no acute distress though does appear anxious.  Discussed best course of action at this time would be to do a trial of several days off of the tramadol to see if symptoms resolve.  Did recommend close follow-up with primary care as well as prescribing provider of the tramadol for follow-up and further recommendations.  ED for severely worsening  symptoms at any time.  Final Clinical Impressions(s) / UC Diagnoses   Final diagnoses:  Nervous  Side effect of drug  Elevated blood pressure reading     Discharge Instructions      Stop the tramadol and follow-up as soon as possible with your primary care provider    ED Prescriptions   None    PDMP not reviewed this encounter.   Particia Nearing, [place] 02/12/24 1344

## 2024-02-14 DIAGNOSIS — M1712 Unilateral primary osteoarthritis, left knee: Secondary | ICD-10-CM | POA: Diagnosis not present

## 2024-02-14 DIAGNOSIS — M25511 Pain in right shoulder: Secondary | ICD-10-CM | POA: Diagnosis not present

## 2024-02-18 DIAGNOSIS — M810 Age-related osteoporosis without current pathological fracture: Secondary | ICD-10-CM | POA: Diagnosis not present

## 2024-02-18 DIAGNOSIS — T50905A Adverse effect of unspecified drugs, medicaments and biological substances, initial encounter: Secondary | ICD-10-CM | POA: Diagnosis not present

## 2024-02-18 DIAGNOSIS — Z6826 Body mass index (BMI) 26.0-26.9, adult: Secondary | ICD-10-CM | POA: Diagnosis not present

## 2024-02-18 DIAGNOSIS — G629 Polyneuropathy, unspecified: Secondary | ICD-10-CM | POA: Diagnosis not present

## 2024-02-18 DIAGNOSIS — I10 Essential (primary) hypertension: Secondary | ICD-10-CM | POA: Diagnosis not present

## 2024-02-18 DIAGNOSIS — G894 Chronic pain syndrome: Secondary | ICD-10-CM | POA: Diagnosis not present

## 2024-02-18 DIAGNOSIS — K8681 Exocrine pancreatic insufficiency: Secondary | ICD-10-CM | POA: Diagnosis not present

## 2024-02-18 DIAGNOSIS — I5032 Chronic diastolic (congestive) heart failure: Secondary | ICD-10-CM | POA: Diagnosis not present

## 2024-02-18 DIAGNOSIS — M5416 Radiculopathy, lumbar region: Secondary | ICD-10-CM | POA: Diagnosis not present

## 2024-02-18 DIAGNOSIS — M1991 Primary osteoarthritis, unspecified site: Secondary | ICD-10-CM | POA: Diagnosis not present

## 2024-02-18 DIAGNOSIS — E039 Hypothyroidism, unspecified: Secondary | ICD-10-CM | POA: Diagnosis not present

## 2024-02-20 DIAGNOSIS — D518 Other vitamin B12 deficiency anemias: Secondary | ICD-10-CM | POA: Diagnosis not present

## 2024-02-20 NOTE — Progress Notes (Signed)
 I saw Jo Parrish in neurology clinic on 02/21/24 in follow up for neuropathy.  HPI: Jo Parrish is a 89 y.o. year old female with a history of vitamin B12 deficiency, pre-diabetes, chronic pain, HTN, HLD, hypothyroidism, GERD, OA, OSA (not on CPAP) who we last saw on 04/26/23.  To briefly review: Salty taste in mouth at 89 years old that continues until now. Patient states she was seen at [facility] and told she had Sjogren's, lupus per patient. She has also been seen at [facility].   Patient has burning and numbness in both legs. This has been present for about 2 years ago. It started in both feet. It current is up to above the ankles/low calf.    Patient has low back pain and has a history of injections from ortho. She has pain that radiates from her back into her legs.   Patient has tried gabapentin in the past, but could not tolerate it. Patient also tried Lyrica. She could tolerate this. She said both cause mouth problems. She has tried multiple creams as well. She does not recognize the name lidocaine cream. She does not tolerate medications well due to Sjogren's per patient.   She takes tramadol for back pain. She also takes tylenol. This can help with the burning in her feet.   Patient has been previously seen in neurology by Dr. Teresa Coombs at [facility] (last seen in clinic on 08/01/21). Most recent assessment and plan:   89 y.o. year old female with medical history of reported autoimmune disease, osteoarthritis, polyarthritis, hypothyroidism and hypertension who is presenting for bilateral lower extremity neuropathy.  Patient stated for the past 6 months she has been having burning pain worse on the right and worse at night. Burning sensation and numbness also involving both hands.  She has tried gabapentin in the past but she discontinued due to mouth sores.  On exam she does have decreased sensation to light touch, vibration, and pinprick involving both lower extremities and bilateral hands in a  stocking glove distribution right greater than left.  I recommended patient to give a trial of pregabalin 50 mg nightly starting 25 mg nightly then increase to 50 mg nightly.  If the pregabalin does not improve her symptoms then we can try lidocaine cream.  I also prescribed her diclofenac for her joint pain.  Patient is to follow-up in 3 months     1. Other polyneuropathy   2. Cramp in lower leg       PLAN: Trial of Pregabalin for neuropathic pain              Take 1/2 pill nightly for one week              Then increase to 1 tab nightly  You can try Diclofenac as needed for the pain You can try Lidocaine cream in both legs if Pregabalin not helpful.  Return to clinic in 3 months.    She does not report any constitutional symptoms like fever, night sweats, anorexia or unintentional weight loss.   EtOH use: Never  Restrictive diet? No   She has not done PT recently.  Most recent Assessment and Plan (04/26/23): Jo Parrish is a 89 y.o. female who presents for evaluation of numbness and burning pain in feet. She has a relevant medical history of vitamin B12 deficiency, pre-diabetes, chronic pain, HTN, HLD, hypothyroidism, GERD, OA, OSA (not on CPAP). Her neurological examination is pertinent for absent vibratory sense in bilateral  feet and gait imbalance. Available diagnostic data is significant for HbA1c of 6.0, B12 of 755. Patient's symptoms are likely multifactorial with contributions from a distal symmetric polyneuropathy, OA, and lumbar spine disease.   PLAN: -Blood work: B1, IFE -Will try Cymbalta 30 mg qhs -Lidocaine cream PRN -Discussed PT, patient will defer for now  Since their last visit: Labs were normal. She did not like Cymbalta as it caused nausea and stomach pain, so stopped taking it. She cannot tolerate gabapentin or lyrica.  She continues to have pain in both legs. She uses Nervive cream (lidocaine and menthol). She also soaks her feet. She also uses a magnet  treatment on her feet.  She continues to have a lot of low back pain.  She has not had any recent falls.   MEDICATIONS:  Outpatient Encounter Medications as of 02/21/2024  Medication Sig   acetaminophen (TYLENOL) 500 MG tablet Take 1,000 mg by mouth every 6 (six) hours as needed for moderate pain.   ALPHA LIPOIC ACID PO Take by mouth. 600 mg daily   amLODipine (NORVASC) 10 MG tablet Take 1 tablet (10 mg total) by mouth daily.   aspirin 81 MG tablet Take 81 mg by mouth daily.   atenolol (TENORMIN) 25 MG tablet Take 1 tablet (25 mg total) by mouth daily.   Biotin (BIOTIN 5000) 5 MG CAPS Take 5 mg by mouth daily.   cholecalciferol (VITAMIN D3) 25 MCG (1000 UNIT) tablet Take 1,000 Units by mouth daily.   Cyanocobalamin (VITAMIN B-12 IJ) Inject 1 Dose as directed every 30 (thirty) days.   cyanocobalamin (VITAMIN B12) 1000 MCG tablet Take 5,000 mcg by mouth daily.   ezetimibe (ZETIA) 10 MG tablet Take 1 tablet (10 mg total) by mouth at bedtime.   fluticasone (FLONASE) 50 MCG/ACT nasal spray Place 2 sprays into both nostrils daily as needed for allergies.   irbesartan (AVAPRO) 300 MG tablet Take 1 tablet (300 mg total) by mouth daily.   levothyroxine (SYNTHROID) 88 MCG tablet Take 88 mcg by mouth daily.   pantoprazole (PROTONIX) 40 MG tablet Take 1 tablet (40 mg total) by mouth daily.   Plecanatide (TRULANCE) 3 MG TABS Take 1 tablet (3 mg total) by mouth daily.   Polyethyl Glycol-Propyl Glycol (SYSTANE OP) Place 1 drop into both eyes daily as needed (dry eyes).   diazepam (VALIUM) 10 MG tablet Take 10 mg by mouth every 6 (six) hours as needed for anxiety. 1/4 -1/2 of tablet two to three times per week. (Patient not taking: Reported on 02/21/2024)   hydrocortisone (ANUSOL-HC) 25 MG suppository Place rectally. (Patient not taking: Reported on 02/21/2024)   linaclotide (LINZESS) 145 MCG CAPS capsule Take 1 capsule (145 mcg total) by mouth daily. (Patient not taking: Reported on 02/21/2024)   traMADol  (ULTRAM) 50 MG tablet Take by mouth at bedtime. (Patient not taking: Reported on 02/21/2024)   No facility-administered encounter medications on file as of 02/21/2024.    PAST MEDICAL HISTORY: Past Medical History:  Diagnosis Date   Bacterial overgrowth syndrome    Small bowel   Complication of anesthesia    Constipation    Cough 09/13/2015   GERD (gastroesophageal reflux disease)    Hematuria 09/13/2015   Hyperlipidemia    Hypertension    Hypothyroidism    Left bundle branch block    Neuropathy    OSA (obstructive sleep apnea)    UNABLE TO TOLERATE  MASK   PONV (postoperative nausea and vomiting)    Rheumatoid arthritis(714.0)  Sjogren's syndrome (HCC)    UI (urinary incontinence) 01/16/2016   Ulcer, esophagus    Urinary frequency 07/04/2015   UTI (lower urinary tract infection) 09/13/2015   Vaginal atrophy 08/02/2015   Vaginal burning 07/04/2015   Vaginal dryness 07/04/2015   Vulvar itching 08/15/2015   Wheezing on auscultation 09/13/2015    PAST SURGICAL HISTORY: Past Surgical History:  Procedure Laterality Date   APPENDECTOMY     COLONOSCOPY  05/17/2013   Dr. Kinnie Scales, multiple tubular adenomas removed, internal hemorrhoids. Next colonoscopy in 3 years.   DILATION AND CURETTAGE OF UTERUS     ESOPHAGOGASTRODUODENOSCOPY  06/07/2008    Gastritis. No celiac sprue/Small hiatal hernia   ESOPHAGOGASTRODUODENOSCOPY  05/17/2013   Dr. Kinnie Scales, erosive esophagitis, grade B., large hiatal hernia.   Hydrogen breath test  05/17/2008   small bowel bacterial overgrowth/Hydrogen level rise consistent with small bowel bacterial   IR SACROPLASTY BILATERAL  12/13/2022   TONSILLECTOMY     TOTAL KNEE ARTHROPLASTY Right 10/09/2013   Procedure: RIGHT TOTAL KNEE ARTHROPLASTY;  Surgeon: Loanne Drilling, MD;  Location: [facility];  Service: Orthopedics;  Laterality: Right;   TRIGGER FINGER RELEASE Right 06/2023   Dr Amanda Pea    ALLERGIES: Allergies  Allergen Reactions   Dicyclomine Swelling     Patient reported tongue swelling when taking   Amoxicillin     Mouth sores   Cymbalta [Duloxetine Hcl]    Doxycycline     Mouth sores   Gabapentin     Mouth sores   Levaquin [Levofloxacin In D5w]     Mouth sores   Lyrica [Pregabalin]     Mouth sores   Prednisone     Mouth sores   Statins Other (See Comments)    Leg problems   Sucralfate     Sore mouth   Sulfa Antibiotics Other (See Comments)    Mouth sores   Fenofibrate Micronized Other (See Comments)    unknown   Tizanidine Other (See Comments)    Dry mouth, slurred speech    FAMILY HISTORY: Family History  Problem Relation Age of Onset   Stroke Mother    Heart disease Father    Liver disease Father    Stroke Maternal Grandmother    COPD Son    Sleep apnea Son     SOCIAL HISTORY: Social History   Tobacco Use   Smoking status: Never    Passive exposure: Never   Smokeless tobacco: Never  Vaping Use   Vaping status: Never Used  Substance Use Topics   Alcohol use: No   Drug use: No   Social History   Social History Narrative   Lives alone   Right Handed   Drinks 1-2 cups caffeine daily      Are you currently employed ?    What is your current occupation?retired   Do you live at home alone?yes   Who lives with you?    What type of home do you live in: 1 story or 2 story? one    And Drives Fast    Objective:  Vital Signs:  BP 115/60   Pulse 83   Ht 5\' 2"  (1.575 m)   Wt 148 lb (67.1 kg)   SpO2 94%   BMI 27.07 kg/m   General: General appearance: Awake and alert. No distress. Cooperative with exam.  Skin: No obvious rash or jaundice. HEENT: Atraumatic. Anicteric. Lungs: Non-labored breathing on room air  Extremities: No edema. Arthritic changes in hands  Neurological: Mental Status:  Alert. Speech fluent. No pseudobulbar affect Cranial Nerves: CNII: No RAPD. Visual fields intact. CNIII, IV, VI: PERRL. No nystagmus. EOMI. CN V: Facial sensation intact bilaterally to fine touch. CN  VII: Facial muscles symmetric and strong. No ptosis at rest. CN VIII: Hears finger rub well bilaterally. CN IX: No hypophonia. CN X: Palate elevates symmetrically. CN XI: Full strength shoulder shrug bilaterally. CN XII: Tongue protrusion full and midline. No atrophy or fasciculations. No significant dysarthria Motor: Tone is normal. Strength is 5/5 in bilateral upper and lower extremities.  Reflexes:  Right Left  Bicep 2+ 2+  Tricep 2+ 2+  BrRad 2+ 2+  Knee 0 (right knee replacement) 2+  Ankle 0 0   Sensation: Diminished in lower extremities to vibration Coordination: Intact finger-to- nose-finger bilaterally.  Gait: Unsteady, antalgic gait.   Lab and Test Review: New results: 04/26/23: B1 wnl IFE no M protein  Previously reviewed results: 02/18/23: HbA1c: 6.0 B12: 755   External labs: 12/2022: B12: > 2000 Folate wnl HbA1c 6.0  ASSESSMENT: This is Jo Parrish, a 89 y.o. female with numbness and burning pain in bilateral legs. Her symptoms are likely multifactorial with contributions from a distal symmetric polyneuropathy, OA, and lumbar spine disease. She has not tolerated neuropathic pain medications (gabapentin, Lyrica, or Cymbalta) and is not interested in trying any more. She gets some relief with lidocaine cream. Overall, she is disappointed there is not much more to offer and does not find great benefit from seeing neurology per her report.  Plan: -Continue lidocaine cream PRN -Patient not interesting in trying more medication or PT  Return to clinic as needed  Total time spent reviewing records, interview, history/exam, documentation, and coordination of care on day of encounter:  30 min  Jacquelyne Balint, MD

## 2024-02-21 ENCOUNTER — Encounter: Payer: Self-pay | Admitting: Neurology

## 2024-02-21 ENCOUNTER — Ambulatory Visit: Admitting: Neurology

## 2024-02-21 VITALS — BP 115/60 | HR 83 | Ht 62.0 in | Wt 148.0 lb

## 2024-02-21 DIAGNOSIS — G629 Polyneuropathy, unspecified: Secondary | ICD-10-CM | POA: Diagnosis not present

## 2024-02-21 DIAGNOSIS — R2689 Other abnormalities of gait and mobility: Secondary | ICD-10-CM | POA: Diagnosis not present

## 2024-02-21 DIAGNOSIS — M5416 Radiculopathy, lumbar region: Secondary | ICD-10-CM | POA: Diagnosis not present

## 2024-02-21 NOTE — Patient Instructions (Signed)
 The physicians and staff at [facility] are committed to providing excellent care. You may receive a survey requesting feedback about your experience at our office. We strive to receive "very good" responses to the survey questions. If you feel that your experience would prevent you from giving the office a "very good " response, please contact our office to try to remedy the situation. We may be reached at [phone]. Thank you for taking the time out of your busy day to complete the survey.  Jacquelyne Balint, MD [facility]  Preventing Falls at [facility] are common, often dreaded events in the lives of older people. Aside from the obvious injuries and even death that may result, fall can cause wide-ranging consequences including loss of independence, mental decline, decreased activity and mobility. Younger people are also at risk of falling, especially those with chronic illnesses and fatigue.  Ways to reduce risk for falling Examine diet and medications. Warm foods and alcohol dilate blood vessels, which can lead to dizziness when standing. Sleep aids, antidepressants and pain medications can also increase the likelihood of a fall.  Get a vision exam. Poor vision, cataracts and glaucoma increase the chances of falling.  Check foot gear. Shoes should fit snugly and have a sturdy, nonskid sole and a broad, low heel  Participate in a physician-approved exercise program to build and maintain muscle strength and improve balance and coordination. Programs that use ankle weights or stretch bands are excellent for muscle-strengthening. Water aerobics programs and low-impact Tai Chi programs have also been shown to improve balance and coordination.  Increase vitamin D intake. Vitamin D improves muscle strength and increases the amount of calcium the body is able to absorb and deposit in bones.  How to prevent falls from common hazards Floors - Remove all loose wires, cords, and throw rugs.  Minimize clutter. Make sure rugs are anchored and smooth. Keep furniture in its usual place.  Chairs -- Use chairs with straight backs, armrests and firm seats. Add firm cushions to existing pieces to add height.  Bathroom - Install grab bars and non-skid tape in the tub or shower. Use a bathtub transfer bench or a shower chair with a back support Use an elevated toilet seat and/or safety rails to assist standing from a low surface. Do not use towel racks or bathroom tissue holders to help you stand.  Lighting - Make sure halls, stairways, and entrances are well-lit. Install a night light in your bathroom or hallway. Make sure there is a light switch at the top and bottom of the staircase. Turn lights on if you get up in the middle of the night. Make sure lamps or light switches are within reach of the bed if you have to get up during the night.  Kitchen - Install non-skid rubber mats near the sink and stove. Clean spills immediately. Store frequently used utensils, pots, pans between waist and eye level. This helps prevent reaching and bending. Sit when getting things out of lower cupboards.  Living room/ Bedrooms - Place furniture with wide spaces in between, giving enough room to move around. Establish a route through the living room that gives you something to hold onto as you walk.  Stairs - Make sure treads, rails, and rugs are secure. Install a rail on both sides of the stairs. If stairs are a threat, it might be helpful to arrange most of your activities on the lower level to reduce the number of times you must climb the  stairs.  Entrances and doorways - Install metal handles on the walls adjacent to the doorknobs of all doors to make it more secure as you travel through the doorway.  Tips for maintaining balance Keep at least one hand free at all times. Try using a backpack or fanny pack to hold things rather than carrying them in your hands. Never carry objects in both hands when walking  as this interferes with keeping your balance.  Attempt to swing both arms from front to back while walking. This might require a conscious effort if Parkinson's disease has diminished your movement. It will, however, help you to maintain balance and posture, and reduce fatigue.  Consciously lift your feet off of the ground when walking. Shuffling and dragging of the feet is a common culprit in losing your balance.  When trying to navigate turns, use a "U" technique of facing forward and making a wide turn, rather than pivoting sharply.  Try to stand with your feet shoulder-length apart. When your feet are close together for any length of time, you increase your risk of losing your balance and falling.  Do one thing at a time. Don't try to walk and accomplish another task, such as reading or looking around. The decrease in your automatic reflexes complicates motor function, so the less distraction, the better.  Do not wear rubber or gripping soled shoes, they might "catch" on the floor and cause tripping.  Move slowly when changing positions. Use deliberate, concentrated movements and, if needed, use a grab bar or walking aid. Count 15 seconds between each movement. For example, when rising from a seated position, wait 15 seconds after standing to begin walking.  If balance is a continuous problem, you might want to consider a walking aid such as a cane, walking stick, or walker. Once you've mastered walking with help, you might be ready to try it on your own again.

## 2024-02-23 ENCOUNTER — Other Ambulatory Visit: Payer: Self-pay | Admitting: Physician Assistant

## 2024-03-10 ENCOUNTER — Encounter (INDEPENDENT_AMBULATORY_CARE_PROVIDER_SITE_OTHER): Payer: Self-pay | Admitting: Gastroenterology

## 2024-03-10 ENCOUNTER — Ambulatory Visit (INDEPENDENT_AMBULATORY_CARE_PROVIDER_SITE_OTHER): Admitting: Gastroenterology

## 2024-03-10 VITALS — BP 116/67 | HR 78 | Temp 97.9°F | Ht 62.0 in | Wt 146.5 lb

## 2024-03-10 DIAGNOSIS — K5904 Chronic idiopathic constipation: Secondary | ICD-10-CM

## 2024-03-10 MED ORDER — PRUCALOPRIDE SUCCINATE 2 MG PO TABS: 2.0000 mg | ORAL_TABLET | Freq: Every day | ORAL | 1 refills | Status: DC

## 2024-03-10 NOTE — Patient Instructions (Addendum)
-  start motegrity 2mg  daily  -continue miralax 3 capfuls daily until bowels start to move then can decrease to 1-2 capfuls per day  -Increase water intake, aim for atleast 64 oz per day -Increase fruits, veggies and whole grains, kiwi and prunes are especially good for constipation  Follow up 6-8 weeks   It was a pleasure to see you today. I want to create trusting relationships with patients and provide genuine, compassionate, and quality care. I truly value your feedback! please be on the lookout for a survey regarding your visit with me today. I appreciate your input about our visit and your time in completing this!    Lesbia Ottaway L. Jeanmarie Hubert, MSN, APRN, AGNP-C Adult-Gerontology Nurse Practitioner [facility] at [facility]

## 2024-03-10 NOTE — Progress Notes (Signed)
 Referring Provider: Elfredia Nevins, MD Primary Care Physician:  Elfredia Nevins, MD Primary GI Physician: Dr. Tasia Catchings   Chief Complaint  Patient presents with   Constipation    Follow up on constipation. Takes linzess, miralax and dulcolax.    HPI:   Jo Parrish is a 89 y.o. female with past medical history of OA, diastolic heart failure , HTN, OSA, polyneuropathy on chronic opiods  , Sjogren syndrome, hypothyroidism, sleep apnea not on CPAP   Patient presenting today for:  Follow up of constipation  Last seen February 2025, at that time patient reported chronic idiopathic constipation.  Previously failed trial of MiraLAX and Dulcolax and also Linzess.  Was prescribed Trulance 3 mg once daily in 2020 for which she never tried.  Reported having 1 bowel movement per day Bristol stool scale 1-2.5 straining after taking Linzess 72 mcg.  She has stopped taking her pain medication but noting her pain has worsened and she wanted to restart her opiates for pain control.  Patient recommended to increase Linzess to 145 mcg, consider increase to 290 mcg if no improvement, could also consider addition of Trulance.  Also recommended to take MiraLAX twice a day x 1 week then once daily thereafter.  Continue Protonix 40 mg twice daily  Of note EGD and colonoscopy were discussed with the patient due to previous CT findings of gastric wall thickening and history of large polyps on previous colonoscopy, patient did decide to defer any endoscopic evaluations at this time due to her age.  Patient called later in Februay after her OV, was advised to take 119mcg-290mcg of linzess, miralax TID and trulance 3mg  daily.  Present: She reports that constipation is no better. She is just taking miralax now. She was previously taking 3 of her linzess and miralax which did not do anything. She feels nauseated. Having a BM very rarely. She finally had a BM this morning after taking miralax, dulcolax stool  softener two days in a row. She passed a very small amount of stool this morning with a softer formed stool then more runny stool after that. Also had a smaller stool yesterday. Stools can be very hard. She does miralax BID some days but notes she is only doing a teaspoon when she takes it. She is not currently taking her pain medication as she states that she was not willing to suffer from taking that.  She also tells me she is unhappy that she cannot get an appointment urgently as she states at her previous GI she was able to call and get an appt the same day.    Last JYN:8295 Grade B erosive esophagitis  Large HH   Last Colonoscopy:2014 by Dr Lottie Mussel Multiple large polyps removed Repeat 3 years  Past Medical History:  Diagnosis Date   Bacterial overgrowth syndrome    Small bowel   Complication of anesthesia    Constipation    Cough 09/13/2015   GERD (gastroesophageal reflux disease)    Hematuria 09/13/2015   Hyperlipidemia    Hypertension    Hypothyroidism    Left bundle branch block    Neuropathy    OSA (obstructive sleep apnea)    UNABLE TO TOLERATE  MASK   PONV (postoperative nausea and vomiting)    Rheumatoid arthritis(714.0)    Sjogren's syndrome (HCC)    UI (urinary incontinence) 01/16/2016   Ulcer, esophagus    Urinary frequency 07/04/2015   UTI (lower urinary tract infection) 09/13/2015   Vaginal atrophy 08/02/2015  Vaginal burning 07/04/2015   Vaginal dryness 07/04/2015   Vulvar itching 08/15/2015   Wheezing on auscultation 09/13/2015    Past Surgical History:  Procedure Laterality Date   APPENDECTOMY     COLONOSCOPY  05/17/2013   Dr. Kinnie Scales, multiple tubular adenomas removed, internal hemorrhoids. Next colonoscopy in 3 years.   DILATION AND CURETTAGE OF UTERUS     ESOPHAGOGASTRODUODENOSCOPY  06/07/2008    Gastritis. No celiac sprue/Small hiatal hernia   ESOPHAGOGASTRODUODENOSCOPY  05/17/2013   Dr. Kinnie Scales, erosive esophagitis, grade B., large hiatal  hernia.   Hydrogen breath test  05/17/2008   small bowel bacterial overgrowth/Hydrogen level rise consistent with small bowel bacterial   IR SACROPLASTY BILATERAL  12/13/2022   TONSILLECTOMY     TOTAL KNEE ARTHROPLASTY Right 10/09/2013   Procedure: RIGHT TOTAL KNEE ARTHROPLASTY;  Surgeon: Loanne Drilling, MD;  Location: [facility];  Service: Orthopedics;  Laterality: Right;   TRIGGER FINGER RELEASE Right 06/2023   Dr Amanda Pea    Current Outpatient Medications  Medication Sig Dispense Refill   acetaminophen (TYLENOL) 500 MG tablet Take 1,000 mg by mouth every 6 (six) hours as needed for moderate pain.     ALPHA LIPOIC ACID PO Take by mouth. 600 mg daily     amLODipine (NORVASC) 10 MG tablet Take 1 tablet by mouth once daily 90 tablet 0   aspirin 81 MG tablet Take 81 mg by mouth daily.     atenolol (TENORMIN) 25 MG tablet Take 1 tablet (25 mg total) by mouth daily. 90 tablet 3   Biotin (BIOTIN 5000) 5 MG CAPS Take 5 mg by mouth daily.     cholecalciferol (VITAMIN D3) 25 MCG (1000 UNIT) tablet Take 1,000 Units by mouth daily.     Cyanocobalamin (VITAMIN B-12 IJ) Inject 1 Dose as directed every 30 (thirty) days.     cyanocobalamin (VITAMIN B12) 1000 MCG tablet Take 5,000 mcg by mouth daily.     diazepam (VALIUM) 10 MG tablet Take 10 mg by mouth every 6 (six) hours as needed for anxiety. 1/4 -1/2 of tablet two to three times per week. (Patient not taking: Reported on 02/21/2024)     ezetimibe (ZETIA) 10 MG tablet TAKE 1 TABLET BY MOUTH AT BEDTIME 90 tablet 0   fluticasone (FLONASE) 50 MCG/ACT nasal spray Place 2 sprays into both nostrils daily as needed for allergies.     hydrocortisone (ANUSOL-HC) 25 MG suppository Place rectally. (Patient not taking: Reported on 02/21/2024)     irbesartan (AVAPRO) 300 MG tablet Take 1 tablet (300 mg total) by mouth daily. 90 tablet 3   levothyroxine (SYNTHROID) 88 MCG tablet Take 88 mcg by mouth daily.     linaclotide (LINZESS) 145 MCG CAPS capsule Take 1 capsule (145  mcg total) by mouth daily. (Patient not taking: Reported on 02/21/2024) 90 capsule 1   pantoprazole (PROTONIX) 40 MG tablet Take 1 tablet (40 mg total) by mouth daily. 90 tablet 1   Plecanatide (TRULANCE) 3 MG TABS Take 1 tablet (3 mg total) by mouth daily. 30 tablet 5   Polyethyl Glycol-Propyl Glycol (SYSTANE OP) Place 1 drop into both eyes daily as needed (dry eyes).     traMADol (ULTRAM) 50 MG tablet Take by mouth at bedtime. (Patient not taking: Reported on 02/21/2024)     No current facility-administered medications for this visit.    Allergies as of 03/10/2024 - Review Complete 03/10/2024  Allergen Reaction Noted   Dicyclomine Swelling 01/15/2022   Amoxicillin  07/04/2015  Cymbalta [duloxetine hcl]  07/18/2023   Doxycycline  12/01/2021   Gabapentin  07/12/2021   Levaquin [levofloxacin in d5w]  07/04/2015   Lyrica [pregabalin]  12/11/2022   Prednisone  05/25/2020   Statins Other (See Comments) 09/30/2013   Sucralfate  05/25/2020   Sulfa antibiotics Other (See Comments) 09/30/2013   Fenofibrate micronized Other (See Comments) 01/12/2017   Tizanidine Other (See Comments) 01/12/2017    Social History   Socioeconomic History   Marital status: Widowed    Spouse name: Not on file   Number of children: Not on file   Years of education: Not on file   Highest education level: Not on file  Occupational History   Occupation: [employer]  Tobacco Use   Smoking status: Never    Passive exposure: Never   Smokeless tobacco: Never  Vaping Use   Vaping status: Never Used  Substance and Sexual Activity   Alcohol use: No   Drug use: No   Sexual activity: Not Currently    Birth control/protection: Post-menopausal  Other Topics Concern   Not on file  Social History Narrative   Lives alone   Right Handed   Drinks 1-2 cups caffeine daily      Are you currently employed ?    What is your current occupation?retired   Do you live at home alone?yes   Who lives with you?    What type  of home do you live in: 1 story or 2 story? one    And Drives Fast   Social Drivers of Health   Financial Resource Strain: Not on file  Food Insecurity: Not on file  Transportation Needs: Not on file  Physical Activity: Not on file  Stress: Not on file  Social Connections: Not on file    Review of systems General: negative for malaise, night sweats, fever, chills, weight loss Neck: Negative for lumps, goiter, pain and significant neck swelling Resp: Negative for cough, wheezing, dyspnea at rest CV: Negative for chest pain, leg swelling, palpitations, orthopnea GI: denies melena, hematochezia, nausea, vomiting, diarrhea, dysphagia, odyonophagia, early satiety or unintentional weight loss. +constipation The remainder of the review of systems is noncontributory.  Physical Exam: There were no vitals taken for this visit. General:   Alert and oriented. No distress noted. Pleasant and cooperative.  Head:  Normocephalic and atraumatic. Eyes:  Conjuctiva clear without scleral icterus. Mouth:  Oral mucosa pink and moist. Good dentition. No lesions. Heart: Normal rate and rhythm, s1 and s2 heart sounds present.  Lungs: Clear lung sounds in all lobes. Respirations equal and unlabored. Abdomen:  good BS, soft, non-tender and non-distended. No rebound or guarding. No HSM or masses noted. Neurologic:  Alert and  oriented x4 Psych:  Alert and cooperative. Normal mood and affect.  Invalid input(s): "6 MONTHS"   ASSESSMENT: Jo Parrish is a 89 y.o. female presenting today for chronic idiopathic constipation  Continues to have issues with constipation, stopped linzess as she did not feel it was providing any relief. Could not afford trulance. I recommended doing a bowel prep and then starting motegrity as this appears covered by her insurance though she refused the bowel prep as she stated she did not think she could do it. We will do 1 capful of miralax TID and start motegrity 2mg  daily. She  needs to increase her water intake as I discussed with her. Once bowels start moving, she can titrate down on miralax to 1-2 capfuls daily. May consider Ibsrela if she does  not have results with motegrity.    PLAN:  -start motegrity 2mg  daily -continue miralax 3 capfuls daily until bowels start to move then can decrease to 1-2 capfuls per day  -Increase water intake, aim for atleast 64 oz per day -Increase fruits, veggies and whole grains, kiwi and prunes are especially good for constipation  All questions were answered, patient verbalized understanding and is in agreement with plan as outlined above.   Follow Up: 6-8 weeks   Maleeah Crossman L. Jeanmarie Hubert, MSN, APRN, AGNP-C Adult-Gerontology Nurse Practitioner [facility]

## 2024-03-18 ENCOUNTER — Ambulatory Visit (INDEPENDENT_AMBULATORY_CARE_PROVIDER_SITE_OTHER): Admitting: Gastroenterology

## 2024-03-18 DIAGNOSIS — G4739 Other sleep apnea: Secondary | ICD-10-CM | POA: Diagnosis not present

## 2024-03-18 DIAGNOSIS — I5032 Chronic diastolic (congestive) heart failure: Secondary | ICD-10-CM | POA: Diagnosis not present

## 2024-03-18 DIAGNOSIS — M1991 Primary osteoarthritis, unspecified site: Secondary | ICD-10-CM | POA: Diagnosis not present

## 2024-03-18 DIAGNOSIS — M810 Age-related osteoporosis without current pathological fracture: Secondary | ICD-10-CM | POA: Diagnosis not present

## 2024-03-18 DIAGNOSIS — E039 Hypothyroidism, unspecified: Secondary | ICD-10-CM | POA: Diagnosis not present

## 2024-03-18 DIAGNOSIS — M5416 Radiculopathy, lumbar region: Secondary | ICD-10-CM | POA: Diagnosis not present

## 2024-03-18 DIAGNOSIS — I10 Essential (primary) hypertension: Secondary | ICD-10-CM | POA: Diagnosis not present

## 2024-03-18 DIAGNOSIS — E063 Autoimmune thyroiditis: Secondary | ICD-10-CM | POA: Diagnosis not present

## 2024-03-18 DIAGNOSIS — K8681 Exocrine pancreatic insufficiency: Secondary | ICD-10-CM | POA: Diagnosis not present

## 2024-03-18 DIAGNOSIS — Z6826 Body mass index (BMI) 26.0-26.9, adult: Secondary | ICD-10-CM | POA: Diagnosis not present

## 2024-03-19 DIAGNOSIS — H04223 Epiphora due to insufficient drainage, bilateral lacrimal glands: Secondary | ICD-10-CM | POA: Diagnosis not present

## 2024-03-19 DIAGNOSIS — H04123 Dry eye syndrome of bilateral lacrimal glands: Secondary | ICD-10-CM | POA: Diagnosis not present

## 2024-03-27 ENCOUNTER — Ambulatory Visit
Admission: EM | Admit: 2024-03-27 | Discharge: 2024-03-27 | Disposition: A | Discharge status: Home / Self Care | Attending: Nurse Practitioner | Admitting: Nurse Practitioner

## 2024-03-27 DIAGNOSIS — J069 Acute upper respiratory infection, unspecified: Secondary | ICD-10-CM | POA: Diagnosis not present

## 2024-03-27 LAB — POCT FASTING CBG KUC MANUAL ENTRY: POCT Glucose (KUC): 114 mg/dL — AB (ref 70–99)

## 2024-03-27 LAB — POC COVID19/FLU A&B COMBO
Covid Antigen, POC: NEGATIVE
Influenza A Antigen, POC: NEGATIVE
Influenza B Antigen, POC: NEGATIVE

## 2024-03-27 MED ORDER — ONDANSETRON 4 MG PO TBDP: 4.0000 mg | ORAL_TABLET | Freq: Three times a day (TID) | ORAL | 0 refills | Status: AC | PRN

## 2024-03-27 MED ORDER — ONDANSETRON HCL 4 MG/2ML IJ SOLN
2.0000 mg | Freq: Once | INTRAMUSCULAR | Status: AC
  Administered 2024-03-27: 2 mg via INTRAMUSCULAR

## 2024-03-27 NOTE — ED Triage Notes (Signed)
 Pt reports she has some nausea and sick on her stomach, no appetite, fatigue and  x 3 days   Doctor gave her cephalexin and promethazine but no relief. PCP did not evaluate her.

## 2024-03-27 NOTE — ED Provider Notes (Signed)
 [facility]    CSN: [id number] Arrival date & time: 03/27/24  1624      History   Chief Complaint No chief complaint on file.   HPI Jo Parrish is a 89 y.o. female.   Patient is today with 3-day history of fever, Tmax 100.4 F, cough, congestion, headache, abdominal pain, nausea, decreased appetite, and fatigue.  She reports she called her primary care provider who gave her prescription for Keflex for "chest infection".  She has also tried expired prescription of promethazine 12.5 mg but did not help.  She also took some Mucinex DM and drink a whole glass of water earlier today which helped with the cough.  She is now complaining of ongoing abdominal pain and nausea.  No diarrhea or blood in the stool.  No known sick contacts.  No current chest pain or tightness, vomiting today.  Patient is asking for "a shot of nausea medicine."    Past Medical History:  Diagnosis Date   Bacterial overgrowth syndrome    Small bowel   Complication of anesthesia    Constipation    Cough 09/13/2015   GERD (gastroesophageal reflux disease)    Hematuria 09/13/2015   Hyperlipidemia    Hypertension    Hypothyroidism    Left bundle branch block    Neuropathy    OSA (obstructive sleep apnea)    UNABLE TO TOLERATE  MASK   PONV (postoperative nausea and vomiting)    Rheumatoid arthritis(714.0)    Sjogren's syndrome (HCC)    UI (urinary incontinence) 01/16/2016   Ulcer, esophagus    Urinary frequency 07/04/2015   UTI (lower urinary tract infection) 09/13/2015   Vaginal atrophy 08/02/2015   Vaginal burning 07/04/2015   Vaginal dryness 07/04/2015   Vulvar itching 08/15/2015   Wheezing on auscultation 09/13/2015    Patient Active Problem List   Diagnosis Date Noted   Gastroesophageal reflux disease with esophagitis without hemorrhage 01/21/2024   History of colonic polyps 01/21/2024   Mild aortic stenosis 12/12/2018   UI (urinary incontinence) 01/16/2016   Lower urinary  tract infectious disease 09/13/2015   Hematuria 09/13/2015   Cough 09/13/2015   Wheezing on auscultation 09/13/2015   Vulvar itching 08/15/2015   Vaginal atrophy 08/02/2015   Urinary frequency 07/04/2015   Vaginal burning 07/04/2015   Vaginal dryness 07/04/2015   Rash, legs 06/21/2015   Constipation 01/07/2014   Anemia 10/20/2013   Heme positive stool 10/20/2013   Abnormal LFTs 10/20/2013   Nausea alone 10/20/2013   Cellulitis of leg, right 10/16/2013   Constipation 10/16/2013   Postoperative anemia due to acute blood loss 10/12/2013   Hyponatremia 10/12/2013   OA (osteoarthritis) of knee 10/09/2013   Heart palpitations 10/07/2013   Hypothyroidism 08/13/2013   Chronic diastolic heart failure (HCC) 08/06/2013   Essential hypertension 08/06/2013   Hyperlipidemia 08/06/2013   Knee pain 03/03/2013    Past Surgical History:  Procedure Laterality Date   APPENDECTOMY     COLONOSCOPY  05/17/2013   Dr. Kinnie Scales, multiple tubular adenomas removed, internal hemorrhoids. Next colonoscopy in 3 years.   DILATION AND CURETTAGE OF UTERUS     ESOPHAGOGASTRODUODENOSCOPY  06/07/2008    Gastritis. No celiac sprue/Small hiatal hernia   ESOPHAGOGASTRODUODENOSCOPY  05/17/2013   Dr. Kinnie Scales, erosive esophagitis, grade B., large hiatal hernia.   Hydrogen breath test  05/17/2008   small bowel bacterial overgrowth/Hydrogen level rise consistent with small bowel bacterial   IR SACROPLASTY BILATERAL  12/13/2022   TONSILLECTOMY  TOTAL KNEE ARTHROPLASTY Right 10/09/2013   Procedure: RIGHT TOTAL KNEE ARTHROPLASTY;  Surgeon: Loanne Drilling, MD;  Location: [facility];  Service: Orthopedics;  Laterality: Right;   TRIGGER FINGER RELEASE Right 06/2023   Dr Amanda Pea    OB History     Gravida  1   Para  1   Term  1   Preterm      AB      Living  1      SAB      IAB      Ectopic      Multiple      Live Births  1            Home Medications    Prior to Admission medications    Medication Sig Start Date End Date Taking? Authorizing Provider  ondansetron (ZOFRAN-ODT) 4 MG disintegrating tablet Take 1 tablet (4 mg total) by mouth every 8 (eight) hours as needed for nausea or vomiting. 03/27/24  Yes Valentino Nose, NP  amLODipine (NORVASC) 10 MG tablet Take 1 tablet by mouth once daily 02/24/24   Runell Gess, MD  aspirin 81 MG tablet Take 81 mg by mouth daily.    [provider]  atenolol (TENORMIN) 25 MG tablet Take 1 tablet (25 mg total) by mouth daily. 02/18/23   Flossie Dibble, NP  Biotin (BIOTIN 5000) 5 MG CAPS Take 5 mg by mouth daily.    [provider]  cholecalciferol (VITAMIN D3) 25 MCG (1000 UNIT) tablet Take 1,000 Units by mouth daily.    [provider]  Cyanocobalamin (VITAMIN B-12 IJ) Inject 1 Dose as directed every 30 (thirty) days.    [provider]  cyanocobalamin (VITAMIN B12) 1000 MCG tablet Take 5,000 mcg by mouth daily.    [provider]  diazepam (VALIUM) 10 MG tablet Take 10 mg by mouth every 6 (six) hours as needed for anxiety. 1/4 -1/2 of tablet two to three times per week.    [provider]  ezetimibe (ZETIA) 10 MG tablet TAKE 1 TABLET BY MOUTH AT BEDTIME 02/24/24   Runell Gess, MD  fluticasone [facility]) 50 MCG/ACT nasal spray Place 2 sprays into both nostrils daily as needed for allergies.    [provider]  irbesartan (AVAPRO) 300 MG tablet Take 1 tablet (300 mg total) by mouth daily. 02/18/23   Flossie Dibble, NP  levothyroxine (SYNTHROID) 112 MCG tablet Take 112 mcg by mouth daily. 02/21/20   [provider]  pantoprazole (PROTONIX) 40 MG tablet Take 1 tablet (40 mg total) by mouth daily. 01/21/24 07/19/24  Franky Macho, MD  Polyethyl Glycol-Propyl Glycol (SYSTANE OP) Place 1 drop into both eyes daily as needed (dry eyes).    [provider]  Prucalopride Succinate (MOTEGRITY) 2 MG TABS Take 1 tablet (2 mg total) by mouth daily. 03/10/24   Raquel James, NP    Family History Family History  Problem Relation Age of Onset   Stroke Mother    Heart disease Father    Liver disease Father    Stroke Maternal Grandmother    COPD Son    Sleep apnea Son     Social History Social History   Tobacco Use   Smoking status: Never    Passive exposure: Never   Smokeless tobacco: Never  Vaping Use   Vaping status: Never Used  Substance Use Topics   Alcohol use: No   Drug use: No  Allergies   Dicyclomine, Amoxicillin, Cymbalta [duloxetine hcl], Doxycycline, Gabapentin, Levaquin [levofloxacin in d5w], Lyrica [pregabalin], Prednisone, Statins, Sucralfate, Sulfa antibiotics, Tramadol, Fenofibrate micronized, and Tizanidine   Review of Systems Review of Systems Per HPI  Physical Exam Triage Vital Signs ED Triage Vitals  Encounter Vitals Group     BP 03/27/24 1640 117/67     Systolic BP Percentile --      Diastolic BP Percentile --      Pulse Rate 03/27/24 1640 80     Resp 03/27/24 1640 20     Temp 03/27/24 1640 98.7 F (37.1 C)     Temp Source 03/27/24 1640 Oral     SpO2 03/27/24 1640 91 %     Weight --      Height --      Head Circumference --      Peak Flow --      Pain Score 03/27/24 1639 0     Pain Loc --      Pain Education --      Exclude from Growth Chart --    No data found.  Updated Vital Signs BP 117/67 (BP Location: Right Arm)   Pulse 80   Temp 98.7 F (37.1 C) (Oral)   Resp 20   SpO2 91%   Visual Acuity Right Eye Distance:   Left Eye Distance:   Bilateral Distance:    Right Eye Near:   Left Eye Near:    Bilateral Near:     Physical Exam Vitals and nursing note reviewed.  Constitutional:      General: She is not in acute distress.    Appearance: Normal appearance. She is not ill-appearing or toxic-appearing.  HENT:     Head: Normocephalic and atraumatic.     Right Ear: Tympanic membrane, ear canal and external ear normal.     Left Ear: Tympanic membrane, ear canal and external ear  normal.     Nose: No congestion or rhinorrhea.     Mouth/Throat:     Mouth: Mucous membranes are moist.     Pharynx: Oropharynx is clear. No oropharyngeal exudate or posterior oropharyngeal erythema.  Eyes:     General: No scleral icterus.    Extraocular Movements: Extraocular movements intact.  Cardiovascular:     Rate and Rhythm: Normal rate and regular rhythm.  Pulmonary:     Effort: Pulmonary effort is normal. No respiratory distress.     Breath sounds: Normal breath sounds. No wheezing, rhonchi or rales.  Abdominal:     General: Abdomen is flat. Bowel sounds are increased. There is no distension.     Palpations: Abdomen is soft.     Tenderness: There is generalized abdominal tenderness. There is no right CVA tenderness, left CVA tenderness, guarding or rebound.  Musculoskeletal:     Cervical back: Normal range of motion and neck supple.  Lymphadenopathy:     Cervical: No cervical adenopathy.  Skin:    General: Skin is warm and dry.     Coloration: Skin is not jaundiced or pale.     Findings: No erythema or rash.  Neurological:     Mental Status: She is alert and oriented to person, place, and time.  Psychiatric:        Behavior: Behavior is cooperative.      UC Treatments / Results  Labs (all labs ordered are listed, but only abnormal results are displayed) Labs Reviewed  POCT FASTING CBG [facility] MANUAL ENTRY - Abnormal; Notable for the following components:  Result Value   POCT Glucose ([facility]) 114 (*)    All other components within normal limits  POC COVID19/FLU A&B COMBO    EKG   Radiology No results found.  Procedures Procedures (including critical care time)  Medications Ordered in UC Medications  ondansetron (ZOFRAN) injection 2 mg (2 mg Intramuscular Given 03/27/24 1700)    Initial Impression / Assessment and Plan / UC Course  I have reviewed the triage vital signs and the nursing notes.  Pertinent labs & imaging results that were available during  my care of the patient were reviewed by me and considered in my medical decision making (see chart for details).   Patient is well-appearing, normotensive, afebrile, not tachycardic, not tachypneic, oxygenating well on room air.   1. Viral URI with cough Overall, vitals and exam are reassuring in urgent care today Given decreased appetite, blood sugar obtained and not low today COVID-19, influenza testing is negative Zofran 2 mg IM was given in urgent care today which helped with patient's nausea, patient was able to tolerate water shortly thereafter Stop expired Phenergan, Keflex and continue Mucinex; I explained to the patient I believe the Keflex is giving her side effects and she has a viral infection that will likely need to run its course Her exam is reassuring today Supportive care discussed Strict ER precautions also discussed  The patient was given the opportunity to ask questions.  All questions answered to their satisfaction.  The patient is in agreement to this plan.   Final Clinical Impressions(s) / UC Diagnoses   Final diagnoses:  Viral URI with cough     Discharge Instructions      You have a viral upper respiratory infection.  Symptoms should improve over the next week to 10 days.  If you develop chest pain or shortness of breath, go to the emergency room.  COVID-19 and influenza testing is negative today.  Some things that can make you feel better are: - Increased rest - Increasing fluid with water/sugar free electrolytes - Acetaminophen and ibuprofen as needed for fever/pain - Salt water gargling, chloraseptic spray and throat lozenges - OTC guaifenesin (Mucinex) 600 mg twice daily for congestion - Saline sinus flushes or a neti pot - Humidifying the air - Zofran every 8 hours as needed for nausea/vomiting  If the symptoms worsen over the weekend, please be seen in the emergency room.     ED Prescriptions     Medication Sig Dispense Auth. Provider    ondansetron (ZOFRAN-ODT) 4 MG disintegrating tablet Take 1 tablet (4 mg total) by mouth every 8 (eight) hours as needed for nausea or vomiting. 10 tablet Valentino Nose, NP      PDMP not reviewed this encounter.   Valentino Nose, NP 03/27/24 1724

## 2024-03-27 NOTE — Discharge Instructions (Addendum)
 You have a viral upper respiratory infection.  Symptoms should improve over the next week to 10 days.  If you develop chest pain or shortness of breath, go to the emergency room.  COVID-19 and influenza testing is negative today.  Some things that can make you feel better are: - Increased rest - Increasing fluid with water/sugar free electrolytes - Acetaminophen and ibuprofen as needed for fever/pain - Salt water gargling, chloraseptic spray and throat lozenges - OTC guaifenesin (Mucinex) 600 mg twice daily for congestion - Saline sinus flushes or a neti pot - Humidifying the air - Zofran every 8 hours as needed for nausea/vomiting  If the symptoms worsen over the weekend, please be seen in the emergency room.

## 2024-03-28 DIAGNOSIS — R051 Acute cough: Secondary | ICD-10-CM | POA: Diagnosis not present

## 2024-03-28 DIAGNOSIS — J209 Acute bronchitis, unspecified: Secondary | ICD-10-CM | POA: Diagnosis not present

## 2024-03-31 ENCOUNTER — Ambulatory Visit (HOSPITAL_COMMUNITY)
Admission: RE | Admit: 2024-03-31 | Discharge: 2024-03-31 | Disposition: A | Discharge status: Home / Self Care | Source: Ambulatory Visit | Attending: Internal Medicine | Admitting: Internal Medicine

## 2024-03-31 ENCOUNTER — Other Ambulatory Visit (HOSPITAL_COMMUNITY): Payer: Self-pay | Admitting: Internal Medicine

## 2024-03-31 DIAGNOSIS — R051 Acute cough: Secondary | ICD-10-CM | POA: Insufficient documentation

## 2024-03-31 DIAGNOSIS — T50905A Adverse effect of unspecified drugs, medicaments and biological substances, initial encounter: Secondary | ICD-10-CM | POA: Diagnosis not present

## 2024-03-31 DIAGNOSIS — K8681 Exocrine pancreatic insufficiency: Secondary | ICD-10-CM | POA: Diagnosis not present

## 2024-03-31 DIAGNOSIS — I5032 Chronic diastolic (congestive) heart failure: Secondary | ICD-10-CM | POA: Diagnosis not present

## 2024-03-31 DIAGNOSIS — I10 Essential (primary) hypertension: Secondary | ICD-10-CM | POA: Diagnosis not present

## 2024-03-31 DIAGNOSIS — E063 Autoimmune thyroiditis: Secondary | ICD-10-CM | POA: Diagnosis not present

## 2024-03-31 DIAGNOSIS — M1991 Primary osteoarthritis, unspecified site: Secondary | ICD-10-CM | POA: Diagnosis not present

## 2024-03-31 DIAGNOSIS — J45909 Unspecified asthma, uncomplicated: Secondary | ICD-10-CM | POA: Diagnosis not present

## 2024-03-31 DIAGNOSIS — Z6825 Body mass index (BMI) 25.0-25.9, adult: Secondary | ICD-10-CM | POA: Diagnosis not present

## 2024-04-07 ENCOUNTER — Other Ambulatory Visit: Payer: Self-pay | Admitting: Physician Assistant

## 2024-04-14 DIAGNOSIS — M5416 Radiculopathy, lumbar region: Secondary | ICD-10-CM | POA: Diagnosis not present

## 2024-04-15 DIAGNOSIS — E039 Hypothyroidism, unspecified: Secondary | ICD-10-CM | POA: Diagnosis not present

## 2024-04-15 DIAGNOSIS — J45909 Unspecified asthma, uncomplicated: Secondary | ICD-10-CM | POA: Diagnosis not present

## 2024-04-15 DIAGNOSIS — E782 Mixed hyperlipidemia: Secondary | ICD-10-CM | POA: Diagnosis not present

## 2024-04-15 DIAGNOSIS — R051 Acute cough: Secondary | ICD-10-CM | POA: Diagnosis not present

## 2024-04-21 DIAGNOSIS — E538 Deficiency of other specified B group vitamins: Secondary | ICD-10-CM | POA: Diagnosis not present

## 2024-04-23 ENCOUNTER — Ambulatory Visit (INDEPENDENT_AMBULATORY_CARE_PROVIDER_SITE_OTHER): Admitting: Gastroenterology

## 2024-04-28 ENCOUNTER — Ambulatory Visit: Admitting: Physician Assistant

## 2024-05-08 DIAGNOSIS — M1712 Unilateral primary osteoarthritis, left knee: Secondary | ICD-10-CM | POA: Diagnosis not present

## 2024-05-21 ENCOUNTER — Encounter (INDEPENDENT_AMBULATORY_CARE_PROVIDER_SITE_OTHER): Payer: Self-pay | Admitting: Gastroenterology

## 2024-05-21 ENCOUNTER — Ambulatory Visit (INDEPENDENT_AMBULATORY_CARE_PROVIDER_SITE_OTHER): Admitting: Gastroenterology

## 2024-05-21 VITALS — BP 115/67 | HR 62 | Temp 97.5°F | Ht 62.0 in | Wt 148.9 lb

## 2024-05-21 DIAGNOSIS — Z8719 Personal history of other diseases of the digestive system: Secondary | ICD-10-CM

## 2024-05-21 DIAGNOSIS — K2100 Gastro-esophageal reflux disease with esophagitis, without bleeding: Secondary | ICD-10-CM

## 2024-05-21 DIAGNOSIS — R110 Nausea: Secondary | ICD-10-CM

## 2024-05-21 DIAGNOSIS — K5900 Constipation, unspecified: Secondary | ICD-10-CM | POA: Diagnosis not present

## 2024-05-21 DIAGNOSIS — M3500 Sicca syndrome, unspecified: Secondary | ICD-10-CM | POA: Insufficient documentation

## 2024-05-21 DIAGNOSIS — K5904 Chronic idiopathic constipation: Secondary | ICD-10-CM

## 2024-05-21 DIAGNOSIS — R6881 Early satiety: Secondary | ICD-10-CM | POA: Diagnosis not present

## 2024-05-21 MED ORDER — OMEPRAZOLE 40 MG PO CPDR: 40.0000 mg | DELAYED_RELEASE_CAPSULE | Freq: Every day | ORAL | 1 refills | Status: DC

## 2024-05-21 NOTE — Progress Notes (Signed)
 Referring Provider: Kathyleen Parkins, MD Primary Care Physician:  Kathyleen Parkins, MD Primary GI Physician: Dr. Alita Irwin   Chief Complaint  Patient presents with   Follow-up    Pt arrives for follow up. Pt states she went to [facility]) and they said that her pancreas did not make enough enzymes to digest food. Pt states every now and then she will have some nausea   HPI:   Jo Parrish is a 89 y.o. female with past medical history of  OA, diastolic heart failure , HTN, OSA, polyneuropathy on chronic opiods  , Sjogren syndrome, hypothyroidism, sleep apnea not on CPAP   Patient presenting today for:  Follow up of constipation  Nausea   Last seen march 2025, at that time, taking miralax  for constipation. Previously on linzess  without improvement. Having some nausea.   Patient recommended to start motegrity  2mg  daily, continue miralax , can titrate down if moving bowels.   Present:  She reports she has always had constipation. Sometimes even taking tylenol  makes her constipated. She states she had some testing done by Dr. Andriette Keeling about 5 years ago and was told that her pancreas did not produce enough enzymes. She has a lot of issues due to her Sjogren syndrome. She tries to take her pain medication very seldomly. Feels constipation is pretty well controlled with miralax  and prune juice, she titrates the miralax  depending how her bowels are moving. She is uncertain if she tried motegrity  or not that was prescribed at last visit.   Her main complaint today is nausea. She reports it has been off and on for years but worse over the past 2-3 months. She denies heartburn or regurgitation. Feels certain foods make symptoms worse, usually fried foods. She ate some tomato and bacon this morning but unsure if this made things worse. No vomiting. Appetite is good. She does endorse some early satiety at times. She does not take any NSAIDs. She is taking protonix  40mg  daily. Denies any  dysphagia or odynophagia. Patient states she does not wish to undergo any procedures at this time. She has zofran  on hand but takes it rarely. Feels pepto bismol helps her more.    CT A/P with contrast: 01/2022 Small hiatal hernia with increased moderate circumferential  gastric thickening. Probable gastritis. Endoscopy may be indicated  but no penetrating ulcer, adjacent inflammation or adenopathy are  observed.  - Constipation and diverticulosis. No bowel obstruction or  inflammatory change.  -Coronary artery and aortic atherosclerosis.  -Osteopenia and degenerative change.  -Evidence of pelvic floor relaxation but no bladder prolapse.  Fatty atrophy of pancreas  Last EGD:2014 Grade B erosive esophagitis  Large HH   Last Colonoscopy:2014 by Dr Andriette Keeling Multiple large polyps removed Repeat 3 years  Filed Weights   05/21/24 0850  Weight: 148 lb 14.4 oz (67.5 kg)     Past Medical History:  Diagnosis Date   Bacterial overgrowth syndrome    Small bowel   Complication of anesthesia    Constipation    Cough 09/13/2015   GERD (gastroesophageal reflux disease)    Hematuria 09/13/2015   Hyperlipidemia    Hypertension    Hypothyroidism    Left bundle branch block    Neuropathy    OSA (obstructive sleep apnea)    UNABLE TO TOLERATE  MASK   PONV (postoperative nausea and vomiting)    Rheumatoid arthritis(714.0)    Sjogren's syndrome (HCC)    UI (urinary incontinence) 01/16/2016   Ulcer, esophagus  Urinary frequency 07/04/2015   UTI (lower urinary tract infection) 09/13/2015   Vaginal atrophy 08/02/2015   Vaginal burning 07/04/2015   Vaginal dryness 07/04/2015   Vulvar itching 08/15/2015   Wheezing on auscultation 09/13/2015    Past Surgical History:  Procedure Laterality Date   APPENDECTOMY     COLONOSCOPY  05/17/2013   Dr. Andriette Keeling, multiple tubular adenomas removed, internal hemorrhoids. Next colonoscopy in 3 years.   DILATION AND CURETTAGE OF UTERUS      ESOPHAGOGASTRODUODENOSCOPY  06/07/2008    Gastritis. No celiac sprue/Small hiatal hernia   ESOPHAGOGASTRODUODENOSCOPY  05/17/2013   Dr. Andriette Keeling, erosive esophagitis, grade B., large hiatal hernia.   Hydrogen breath test  05/17/2008   small bowel bacterial overgrowth/Hydrogen level rise consistent with small bowel bacterial   IR SACROPLASTY BILATERAL  12/13/2022   TONSILLECTOMY     TOTAL KNEE ARTHROPLASTY Right 10/09/2013   Procedure: RIGHT TOTAL KNEE ARTHROPLASTY;  Surgeon: Aurther Blue, MD;  Location: [facility];  Service: Orthopedics;  Laterality: Right;   TRIGGER FINGER RELEASE Right 06/2023   Dr Aloha Arnold    Current Outpatient Medications  Medication Sig Dispense Refill   acetaminophen -codeine (TYLENOL  #3) 300-30 MG tablet Take 1 tablet 3 times a day by oral route as needed.     amLODipine  (NORVASC ) 10 MG tablet Take 1 tablet by mouth once daily 90 tablet 0   aspirin 81 MG tablet Take 81 mg by mouth daily.     atenolol  (TENORMIN ) 25 MG tablet Take 1 tablet (25 mg total) by mouth daily. 90 tablet 3   Biotin (BIOTIN 5000) 5 MG CAPS Take 5 mg by mouth daily.     cholecalciferol (VITAMIN D3) 25 MCG (1000 UNIT) tablet Take 1,000 Units by mouth daily.     Cyanocobalamin  (VITAMIN B-12 IJ) Inject 1 Dose as directed every 30 (thirty) days.     cyanocobalamin  (VITAMIN B12) 1000 MCG tablet Take 5,000 mcg by mouth daily.     diazepam (VALIUM) 10 MG tablet Take 10 mg by mouth every 6 (six) hours as needed for anxiety. 1/4 -1/2 of tablet two to three times per week.     ezetimibe  (ZETIA ) 10 MG tablet TAKE 1 TABLET BY MOUTH AT BEDTIME 90 tablet 0   fluticasone (FLONASE) 50 MCG/ACT nasal spray Place 2 sprays into both nostrils daily as needed for allergies.     irbesartan  (AVAPRO ) 300 MG tablet Take 1 tablet by mouth once daily 90 tablet 3   levothyroxine  (SYNTHROID ) 112 MCG tablet Take 112 mcg by mouth daily.     ondansetron  (ZOFRAN -ODT) 4 MG disintegrating tablet Take 1 tablet (4 mg total) by mouth  every 8 (eight) hours as needed for nausea or vomiting. 10 tablet 0   pantoprazole  (PROTONIX ) 40 MG tablet Take 1 tablet (40 mg total) by mouth daily. 90 tablet 1   Polyethyl Glycol-Propyl Glycol (SYSTANE OP) Place 1 drop into both eyes daily as needed (dry eyes).     Prucalopride Succinate  (MOTEGRITY ) 2 MG TABS Take 1 tablet (2 mg total) by mouth daily. 30 tablet 1   No current facility-administered medications for this visit.    Allergies as of 05/21/2024 - Review Complete 05/21/2024  Allergen Reaction Noted   Dicyclomine Swelling 01/15/2022   Amoxicillin  07/04/2015   Cymbalta  [duloxetine  hcl]  07/18/2023   Doxycycline  12/01/2021   Gabapentin  07/12/2021   Levaquin [levofloxacin in d5w]  07/04/2015   Lyrica  [pregabalin ]  12/11/2022   Prednisone   05/25/2020   Statins Other (  See Comments) 09/30/2013   Sucralfate  05/25/2020   Sulfa antibiotics Other (See Comments) 09/30/2013   Tramadol  Other (See Comments) 05/24/2021   Fenofibrate  micronized Other (See Comments) 01/12/2017   Tizanidine Other (See Comments) 01/12/2017    Social History   Socioeconomic History   Marital status: Widowed    Spouse name: Not on file   Number of children: Not on file   Years of education: Not on file   Highest education level: Not on file  Occupational History   Occupation: [employer]  Tobacco Use   Smoking status: Never    Passive exposure: Never   Smokeless tobacco: Never  Vaping Use   Vaping status: Never Used  Substance and Sexual Activity   Alcohol use: No   Drug use: No   Sexual activity: Not Currently    Birth control/protection: Post-menopausal  Other Topics Concern   Not on file  Social History Narrative   Lives alone   Right Handed   Drinks 1-2 cups caffeine daily      Are you currently employed ?    What is your current occupation?retired   Do you live at home alone?yes   Who lives with you?    What type of home do you live in: 1 story or 2 story? one    And Drives  Fast   Social Drivers of Health   Financial Resource Strain: Not on file  Food Insecurity: Not on file  Transportation Needs: Not on file  Physical Activity: Not on file  Stress: Not on file  Social Connections: Not on file    Review of systems General: negative for malaise, night sweats, fever, chills, weight loss Neck: Negative for lumps, goiter, pain and significant neck swelling Resp: Negative for cough, wheezing, dyspnea at rest CV: Negative for chest pain, leg swelling, palpitations, orthopnea GI: denies melena, hematochezia, vomiting, diarrhea, constipation, dysphagia, odyonophagia,or unintentional weight loss. +nausea +early satiety  MSK: Negative for joint pain or swelling, back pain, and muscle pain. Derm: Negative for itching or rash Psych: Denies depression, anxiety, memory loss, confusion. No homicidal or suicidal ideation.  Heme: Negative for prolonged bleeding, bruising easily, and swollen nodes. Endocrine: Negative for cold or heat intolerance, polyuria, polydipsia and goiter. Neuro: negative for tremor, gait imbalance, syncope and seizures. The remainder of the review of systems is noncontributory.  Physical Exam: BP 115/67   Pulse 62   Temp (!) 97.5 F (36.4 C)   Ht 5\' 2"  (1.575 m)   Wt 148 lb 14.4 oz (67.5 kg)   BMI 27.23 kg/m  General:   Alert and oriented. No distress noted. Pleasant and cooperative.  Head:  Normocephalic and atraumatic. Eyes:  Conjuctiva clear without scleral icterus. Mouth:  Oral mucosa pink and moist. Good dentition. No lesions. Heart: Normal rate and rhythm, s1 and s2 heart sounds present.  Lungs: Clear lung sounds in all lobes. Respirations equal and unlabored. Abdomen:  +BS, soft, and non-distended. Mild TTP of epigastric/LUQ region. No rebound or guarding. No HSM or masses noted. Derm: No palmar erythema or jaundice Msk:  Symmetrical without gross deformities. Normal posture. Extremities:  Without edema. Neurologic:  Alert and   oriented x4 Psych:  Alert and cooperative. Normal mood and affect.  Invalid input(s): "6 MONTHS"   ASSESSMENT: LOVELYN SHEERAN is a 89 y.o. female presenting today for follow up of constipation and with nausea  Constipation seems well controlled with miralax , prune juice. She is titrating miralax  depending how her bowels are moving.  Does not think she ever took motegrity . For now will continue with current bowel regimen.  She endorses long history of intermittent nausea, worse over the past few months. She does endorse a lot of tomato/citrus based foods. No real heartburn. Pepto bismol provides some relief. Occasional early satiety but no weight loss, rectal bleeding, melena or vomiting. She has been on protonix  for many years. History of esophagitis and gastritis in the past. Is not taking any NSAIDs. We discussed EGD for further evaluation though she wishes to hold off on procedures at this time. Suspect we may be dealing with some uncontrolled reflux/gastritis. Will stop protonix  and start omeprazole 40mg  daily, unfortunately she has an intolerance to carafate in the past therefore contraindicated at this time. Discussed importance of good reflux precautions. If symptoms fail to improve with PPI therapy change and dietary modifications, will need to further discuss EGD.   PLAN:  -stop protonix  -start omeprazole 40mg  daily -be mindful of spicy, greasy, fried foods, citrus/tomato based foods, caffeine, chocolate -continue miralax  as needed -Increase water intake, aim for atleast 64 oz per day -Increase fruits, veggies and whole grains, kiwi and prunes are especially good for constipation -EGD if symptoms fail to improve with change in PPI therapy/dietary modifications  All questions were answered, patient verbalized understanding and is in agreement with plan as outlined above.   Follow Up: 2 months   Vinette Crites L. Shelsey Rieth, MSN, APRN, AGNP-C Adult-Gerontology Nurse Practitioner [facility]

## 2024-05-21 NOTE — Patient Instructions (Addendum)
-  Please stop pantoprazole  and start omeprazole 40mg  once daily prior to breakfast -Let me know how symptoms are doing in the next few weeks -be mindful of spicy, greasy, fried foods, citrus/tomato based foods, caffeine, chocolate -continue miralax  as needed -Increase water intake, aim for atleast 64 oz per day -Increase fruits, veggies and whole grains, kiwi and prunes are especially good for constipation  Follow up 2 months  It was a pleasure to see you today. I want to create trusting relationships with patients and provide genuine, compassionate, and quality care. I truly value your feedback! please be on the lookout for a survey regarding your visit with me today. I appreciate your input about our visit and your time in completing this!    Koula Venier L. Jarrett Chicoine, MSN, APRN, AGNP-C Adult-Gerontology Nurse Practitioner [facility] at [facility]

## 2024-05-26 ENCOUNTER — Ambulatory Visit (INDEPENDENT_AMBULATORY_CARE_PROVIDER_SITE_OTHER): Admitting: Gastroenterology

## 2024-05-29 ENCOUNTER — Other Ambulatory Visit: Payer: Self-pay | Admitting: Cardiovascular Disease

## 2024-05-29 DIAGNOSIS — D518 Other vitamin B12 deficiency anemias: Secondary | ICD-10-CM | POA: Diagnosis not present

## 2024-06-03 DIAGNOSIS — M25561 Pain in right knee: Secondary | ICD-10-CM | POA: Diagnosis not present

## 2024-06-03 DIAGNOSIS — G5793 Unspecified mononeuropathy of bilateral lower limbs: Secondary | ICD-10-CM | POA: Diagnosis not present

## 2024-06-03 DIAGNOSIS — M792 Neuralgia and neuritis, unspecified: Secondary | ICD-10-CM | POA: Diagnosis not present

## 2024-06-15 ENCOUNTER — Encounter: Payer: Self-pay | Admitting: Physician Assistant

## 2024-06-15 ENCOUNTER — Ambulatory Visit (INDEPENDENT_AMBULATORY_CARE_PROVIDER_SITE_OTHER): Admitting: Physician Assistant

## 2024-06-15 VITALS — BP 124/78 | HR 72 | Temp 97.5°F | Ht 62.0 in | Wt 152.0 lb

## 2024-06-15 DIAGNOSIS — E039 Hypothyroidism, unspecified: Secondary | ICD-10-CM

## 2024-06-15 DIAGNOSIS — I10 Essential (primary) hypertension: Secondary | ICD-10-CM | POA: Diagnosis not present

## 2024-06-15 DIAGNOSIS — N3000 Acute cystitis without hematuria: Secondary | ICD-10-CM

## 2024-06-15 DIAGNOSIS — E782 Mixed hyperlipidemia: Secondary | ICD-10-CM | POA: Diagnosis not present

## 2024-06-15 DIAGNOSIS — Z791 Long term (current) use of non-steroidal anti-inflammatories (NSAID): Secondary | ICD-10-CM | POA: Diagnosis not present

## 2024-06-15 DIAGNOSIS — R300 Dysuria: Secondary | ICD-10-CM | POA: Diagnosis not present

## 2024-06-15 DIAGNOSIS — D519 Vitamin B12 deficiency anemia, unspecified: Secondary | ICD-10-CM | POA: Diagnosis not present

## 2024-06-15 DIAGNOSIS — Z7689 Persons encountering health services in other specified circumstances: Secondary | ICD-10-CM

## 2024-06-15 DIAGNOSIS — M1712 Unilateral primary osteoarthritis, left knee: Secondary | ICD-10-CM

## 2024-06-15 LAB — POCT URINALYSIS DIP (CLINITEK)
Bilirubin, UA: NEGATIVE
Blood, UA: NEGATIVE
Glucose, UA: NEGATIVE mg/dL
Ketones, POC UA: NEGATIVE mg/dL
Leukocytes, UA: NEGATIVE
Nitrite, UA: NEGATIVE
POC PROTEIN,UA: NEGATIVE
Spec Grav, UA: 1.015 (ref 1.010–1.025)
Urobilinogen, UA: NEGATIVE U/dL — AB
pH, UA: 6.5 (ref 5.0–8.0)

## 2024-06-15 MED ORDER — CEPHALEXIN 500 MG PO CAPS: 500.0000 mg | ORAL_CAPSULE | Freq: Two times a day (BID) | ORAL | 0 refills | Status: AC

## 2024-06-15 NOTE — Assessment & Plan Note (Signed)
 Patient presents today with long history of left knee pain secondary to osteoarthritis. Follows with orthopedics for hyaluronic gel injections, however is not able to get additional injections until August. Currently taking Celebrex for pain. Will check CMP to assure patient can continue with this medication.

## 2024-06-15 NOTE — Assessment & Plan Note (Signed)
 Stable. No symptomatolgy at present. TSH and T4 today. No change in management.

## 2024-06-15 NOTE — Assessment & Plan Note (Signed)
 Reports history of B12 deficiency with monthly injections. Patient does endorse fatigue and neuropathy, however symptoms are not new. CBC and B12 level today.

## 2024-06-15 NOTE — Progress Notes (Signed)
 New Patient Office Visit  Subjective    Patient ID: Jo Parrish, female    DOB: 16-Sep-1926  Age: 89 y.o. MRN: [id number]  CC:  Chief Complaint  Patient presents with   New Patient (Initial Visit)    New patient.  Needs left knee replacement.  Seeing Dr tomorrow for nerve block.     HPI Jo Parrish presents to establish care  Patient presents today with past medical history significant for hypertension, hyperlipidemia, hypothyroidism, diastolic heart failure, GERD, A87 deficiency, Sjrogen's syndrome, and osteoarthritis of left knee. She presents today with complaints of left knee pain and burning with urination. She reports following with orthopedics for knee pain and is due for an injection in August. She has been taking Celebrex intermittent as needed for knee pain. She reports Celebrex improves knee pain, however is worried about side effects. Additionally, she reports dysuria x1 day. Denies fevers, nausea, vomiting, CVA or suprapubic tenderness.    Outpatient Encounter Medications as of 06/15/2024  Medication Sig   acetaminophen -codeine (TYLENOL  #3) 300-30 MG tablet Take 1 tablet 3 times a day by oral route as needed.   amLODipine  (NORVASC ) 10 MG tablet Take 1 tablet by mouth once daily   aspirin 81 MG tablet Take 81 mg by mouth daily.   atenolol  (TENORMIN ) 25 MG tablet Take 1 tablet (25 mg total) by mouth daily.   Biotin (BIOTIN 5000) 5 MG CAPS Take 5 mg by mouth daily.   celecoxib (CELEBREX) 100 MG capsule Take 100 mg by mouth 2 (two) times daily as needed.   cephALEXin (KEFLEX) 500 MG capsule Take 1 capsule (500 mg total) by mouth 2 (two) times daily for 7 days.   cholecalciferol (VITAMIN D3) 25 MCG (1000 UNIT) tablet Take 1,000 Units by mouth daily.   Cyanocobalamin  (VITAMIN B-12 IJ) Inject 1 Dose as directed every 30 (thirty) days.   cyanocobalamin  (VITAMIN B12) 1000 MCG tablet Take 5,000 mcg by mouth daily.   diazepam (VALIUM) 10 MG tablet Take 10 mg by mouth every 6  (six) hours as needed for anxiety. 1/4 -1/2 of tablet two to three times per week.   ezetimibe  (ZETIA ) 10 MG tablet TAKE 1 TABLET BY MOUTH AT BEDTIME   fluticasone (FLONASE) 50 MCG/ACT nasal spray Place 2 sprays into both nostrils daily as needed for allergies.   irbesartan  (AVAPRO ) 300 MG tablet Take 1 tablet by mouth once daily   levothyroxine  (SYNTHROID ) 112 MCG tablet Take 112 mcg by mouth daily.   omeprazole  (PRILOSEC) 40 MG capsule Take 1 capsule (40 mg total) by mouth daily.   ondansetron  (ZOFRAN -ODT) 4 MG disintegrating tablet Take 1 tablet (4 mg total) by mouth every 8 (eight) hours as needed for nausea or vomiting.   Polyethyl Glycol-Propyl Glycol (SYSTANE OP) Place 1 drop into both eyes daily as needed (dry eyes).   Prucalopride Succinate  (MOTEGRITY ) 2 MG TABS Take 1 tablet (2 mg total) by mouth daily.   No facility-administered encounter medications on file as of 06/15/2024.    Past Medical History:  Diagnosis Date   Bacterial overgrowth syndrome    Small bowel   Complication of anesthesia    Constipation    Cough 09/13/2015   GERD (gastroesophageal reflux disease)    Hematuria 09/13/2015   Hyperlipidemia    Hypertension    Hypothyroidism    Left bundle branch block    Neuropathy    OSA (obstructive sleep apnea)    UNABLE TO TOLERATE  MASK   PONV (postoperative  nausea and vomiting)    Rheumatoid arthritis(714.0)    Sjogren's syndrome (HCC)    UI (urinary incontinence) 01/16/2016   Ulcer, esophagus    Urinary frequency 07/04/2015   UTI (lower urinary tract infection) 09/13/2015   Vaginal atrophy 08/02/2015   Vaginal burning 07/04/2015   Vaginal dryness 07/04/2015   Vulvar itching 08/15/2015   Wheezing on auscultation 09/13/2015    Past Surgical History:  Procedure Laterality Date   APPENDECTOMY     COLONOSCOPY  05/17/2013   Dr. Luis, multiple tubular adenomas removed, internal hemorrhoids. Next colonoscopy in 3 years.   DILATION AND CURETTAGE OF UTERUS      ESOPHAGOGASTRODUODENOSCOPY  06/07/2008    Gastritis. No celiac sprue/Small hiatal hernia   ESOPHAGOGASTRODUODENOSCOPY  05/17/2013   Dr. Luis, erosive esophagitis, grade B., large hiatal hernia.   Hydrogen breath test  05/17/2008   small bowel bacterial overgrowth/Hydrogen level rise consistent with small bowel bacterial   IR SACROPLASTY BILATERAL  12/13/2022   TONSILLECTOMY     TOTAL KNEE ARTHROPLASTY Right 10/09/2013   Procedure: RIGHT TOTAL KNEE ARTHROPLASTY;  Surgeon: Dempsey LULLA Moan, MD;  Location: [facility];  Service: Orthopedics;  Laterality: Right;   TRIGGER FINGER RELEASE Right 06/2023   Dr Camella    Family History  Problem Relation Age of Onset   Stroke Mother    Heart disease Father    Liver disease Father    Stroke Maternal Grandmother    COPD Son    Sleep apnea Son     Social History   Socioeconomic History   Marital status: Widowed    Spouse name: Not on file   Number of children: Not on file   Years of education: Not on file   Highest education level: Not on file  Occupational History   Occupation: [employer]  Tobacco Use   Smoking status: Never    Passive exposure: Never   Smokeless tobacco: Never  Vaping Use   Vaping status: Never Used  Substance and Sexual Activity   Alcohol use: No   Drug use: No   Sexual activity: Not Currently    Birth control/protection: Post-menopausal  Other Topics Concern   Not on file  Social History Narrative   Lives alone   Right Handed   Drinks 1-2 cups caffeine daily      Are you currently employed ?    What is your current occupation?retired   Do you live at home alone?yes   Who lives with you?    What type of home do you live in: 1 story or 2 story? one    And Drives Fast   Social Drivers of Health   Financial Resource Strain: Not on file  Food Insecurity: Not on file  Transportation Needs: Not on file  Physical Activity: Not on file  Stress: Not on file  Social Connections: Not on file  Intimate  Partner Violence: Not on file    Review of Systems  Constitutional:  Positive for malaise/fatigue. Negative for fever and weight loss.  Respiratory:  Negative for shortness of breath.   Cardiovascular:  Negative for chest pain, palpitations and leg swelling.  Gastrointestinal:  Negative for nausea and vomiting.  Genitourinary:  Positive for dysuria. Negative for flank pain, frequency, hematuria and urgency.  Musculoskeletal:  Positive for back pain and joint pain. Negative for falls, myalgias and neck pain.  Neurological:  Negative for dizziness, weakness and headaches.  Psychiatric/Behavioral:  Negative for depression. The patient is not nervous/anxious.  Objective    BP 124/78   Pulse 72   Temp (!) 97.5 F (36.4 C)   Ht 5' 2 (1.575 m)   Wt 152 lb (68.9 kg)   SpO2 97%   BMI 27.80 kg/m   Physical Exam Constitutional:      Appearance: Normal appearance.  HENT:     Head: Normocephalic.     Mouth/Throat:     Mouth: Mucous membranes are moist.     Pharynx: Oropharynx is clear.   Eyes:     Extraocular Movements: Extraocular movements intact.     Conjunctiva/sclera: Conjunctivae normal.    Cardiovascular:     Rate and Rhythm: Normal rate and regular rhythm.     Heart sounds: Normal heart sounds. No murmur heard.    No gallop.  Pulmonary:     Effort: Pulmonary effort is normal.     Breath sounds: Normal breath sounds. No wheezing or rales.  Abdominal:     General: Abdomen is flat.     Palpations: Abdomen is soft.     Tenderness: There is no abdominal tenderness. There is no right CVA tenderness or left CVA tenderness.   Musculoskeletal:     Right lower leg: No edema.     Left lower leg: No edema.   Skin:    General: Skin is warm and dry.   Neurological:     General: No focal deficit present.     Mental Status: She is alert and oriented to person, place, and time.   Psychiatric:        Mood and Affect: Mood normal.        Behavior: Behavior normal.        Assessment & Plan:  Encounter to establish care  Acute cystitis without hematuria Assessment & Plan: Patient appears stable today.  Physical exam without abnormal findings.  No CVA tenderness or suprapubic tenderness.  Urine dipstick done in office today, however unreadable due to Azo use. Urine sent for culture, will review sensitivities and treat as indicated.  Patient started on Keflex today, states she has previously tolerated this medication.  Patient should return to office if she experiences nausea, vomiting, fevers, flank pain.    Orders: -     POCT URINALYSIS DIP (CLINITEK) -     Urine Culture -     Cephalexin; Take 1 capsule (500 mg total) by mouth 2 (two) times daily for 7 days.  Dispense: 14 capsule; Refill: 0  Essential hypertension Assessment & Plan: 124/78 Controlled. Continue current medications. No change in management. Discussed DASH diet and dietary sodium restrictions.  Continue dietary efforts.    Acquired hypothyroidism Assessment & Plan: Stable. No symptomatolgy at present. TSH and T4 today. No change in management.   Orders: -     TSH + free T4  Osteoarthritis of left knee, unspecified osteoarthritis type Assessment & Plan: Patient presents today with long history of left knee pain secondary to osteoarthritis. Follows with orthopedics for hyaluronic gel injections, however is not able to get additional injections until August. Currently taking Celebrex for pain. Will check CMP to assure patient can continue with this medication.    Mixed hyperlipidemia Assessment & Plan: Stable. Continue with current management without changes. Discussed healthy diet and lifestyle. Lipid panel today.   Orders: -     Lipid panel  Anemia due to vitamin B12 deficiency, unspecified B12 deficiency type Assessment & Plan: Reports history of B12 deficiency with monthly injections. Patient does endorse fatigue and neuropathy, however  symptoms are not new. CBC  and B12 level today.   Orders: -     CBC with Differential/Platelet -     Vitamin B12  Long term (current) use of non-steroidal anti-inflammatories (nsaid) -     CMP14+EGFR    Return in about 6 months (around 12/15/2024).   Charmaine Angalina Ante, PA-C

## 2024-06-15 NOTE — Assessment & Plan Note (Signed)
 Patient appears stable today.  Physical exam without abnormal findings.  No CVA tenderness or suprapubic tenderness.  Urine dipstick done in office today, however unreadable due to Azo use. Urine sent for culture, will review sensitivities and treat as indicated.  Patient started on Keflex today, states she has previously tolerated this medication.  Patient should return to office if she experiences nausea, vomiting, fevers, flank pain.

## 2024-06-15 NOTE — Assessment & Plan Note (Signed)
 124/78 Controlled. Continue current medications. No change in management. Discussed DASH diet and dietary sodium restrictions.  Continue dietary efforts.

## 2024-06-15 NOTE — Assessment & Plan Note (Signed)
 Stable. Continue with current management without changes. Discussed healthy diet and lifestyle. Lipid panel today.

## 2024-06-16 ENCOUNTER — Ambulatory Visit: Payer: Self-pay | Admitting: Physician Assistant

## 2024-06-16 DIAGNOSIS — G90522 Complex regional pain syndrome I of left lower limb: Secondary | ICD-10-CM | POA: Diagnosis not present

## 2024-06-16 LAB — LIPID PANEL
Chol/HDL Ratio: 2.9 ratio (ref 0.0–4.4)
Cholesterol, Total: 191 mg/dL (ref 100–199)
HDL: 67 mg/dL (ref >39)
LDL Chol Calc (NIH): 98 mg/dL (ref 0–99)
Triglycerides: 152 mg/dL — ABNORMAL HIGH (ref 0–149)
VLDL Cholesterol Cal: 26 mg/dL (ref 5–40)

## 2024-06-16 LAB — CMP14+EGFR
ALT: 23 IU/L (ref 0–32)
AST: 27 IU/L (ref 0–40)
Albumin: 4.5 g/dL (ref 3.6–4.6)
Alkaline Phosphatase: 53 IU/L (ref 44–121)
BUN/Creatinine Ratio: 22 (ref 12–28)
BUN: 19 mg/dL (ref 10–36)
Bilirubin Total: 0.4 mg/dL (ref 0.0–1.2)
CO2: 24 mmol/L (ref 20–29)
Calcium: 10 mg/dL (ref 8.7–10.3)
Chloride: 100 mmol/L (ref 96–106)
Creatinine, Ser: 0.86 mg/dL (ref 0.57–1.00)
Globulin, Total: 2.1 g/dL (ref 1.5–4.5)
Glucose: 80 mg/dL (ref 70–99)
Potassium: 5.1 mmol/L (ref 3.5–5.2)
Sodium: 137 mmol/L (ref 134–144)
Total Protein: 6.6 g/dL (ref 6.0–8.5)
eGFR: 62 mL/min/{1.73_m2} (ref >59)

## 2024-06-16 LAB — CBC WITH DIFFERENTIAL/PLATELET
Basophils Absolute: 0 10*3/uL (ref 0.0–0.2)
Basos: 1 %
EOS (ABSOLUTE): 0.1 10*3/uL (ref 0.0–0.4)
Eos: 2 %
Hematocrit: 42 % (ref 34.0–46.6)
Hemoglobin: 13.9 g/dL (ref 11.1–15.9)
Immature Grans (Abs): 0 10*3/uL (ref 0.0–0.1)
Immature Granulocytes: 0 %
Lymphocytes Absolute: 1.5 10*3/uL (ref 0.7–3.1)
Lymphs: 22 %
MCH: 33.7 pg — ABNORMAL HIGH (ref 26.6–33.0)
MCHC: 33.1 g/dL (ref 31.5–35.7)
MCV: 102 fL — ABNORMAL HIGH (ref 79–97)
Monocytes Absolute: 0.6 10*3/uL (ref 0.1–0.9)
Monocytes: 10 %
Neutrophils Absolute: 4.3 10*3/uL (ref 1.4–7.0)
Neutrophils: 65 %
Platelets: 233 10*3/uL (ref 150–450)
RBC: 4.13 x10E6/uL (ref 3.77–5.28)
RDW: 12.2 % (ref 11.7–15.4)
WBC: 6.6 10*3/uL (ref 3.4–10.8)

## 2024-06-16 LAB — TSH+FREE T4
Free T4: 1.5 ng/dL (ref 0.82–1.77)
TSH: 0.307 u[IU]/mL — ABNORMAL LOW (ref 0.450–4.500)

## 2024-06-16 LAB — VITAMIN B12: Vitamin B-12: 795 pg/mL (ref 232–1245)

## 2024-06-17 LAB — SPECIMEN STATUS REPORT

## 2024-06-17 LAB — URINE CULTURE

## 2024-06-24 ENCOUNTER — Ambulatory Visit (INDEPENDENT_AMBULATORY_CARE_PROVIDER_SITE_OTHER): Admitting: Gastroenterology

## 2024-07-07 ENCOUNTER — Ambulatory Visit (INDEPENDENT_AMBULATORY_CARE_PROVIDER_SITE_OTHER): Admitting: Gastroenterology

## 2024-07-09 DIAGNOSIS — G90521 Complex regional pain syndrome I of right lower limb: Secondary | ICD-10-CM | POA: Diagnosis not present

## 2024-07-15 ENCOUNTER — Ambulatory Visit: Admitting: Physician Assistant

## 2024-07-15 ENCOUNTER — Ambulatory Visit (INDEPENDENT_AMBULATORY_CARE_PROVIDER_SITE_OTHER): Admitting: Physician Assistant

## 2024-07-15 VITALS — BP 118/72 | Ht 62.0 in | Wt 152.0 lb

## 2024-07-15 DIAGNOSIS — L299 Pruritus, unspecified: Secondary | ICD-10-CM | POA: Diagnosis not present

## 2024-07-15 MED ORDER — ACETAMINOPHEN-CODEINE 300-30 MG PO TABS: 1.0000 | ORAL_TABLET | Freq: Four times a day (QID) | ORAL | 0 refills | Status: DC | PRN

## 2024-07-16 ENCOUNTER — Encounter: Payer: Self-pay | Admitting: Physician Assistant

## 2024-07-16 NOTE — Assessment & Plan Note (Signed)
 Patient appears well today, in no acute distress. No rashes or skin erythema appreciated. Recommended patient to take previously prescribed hydroxyzine 25 mg at bed time for itching. Patient to return if she experiences fevers, rashes, or skin color changes.

## 2024-07-16 NOTE — Progress Notes (Signed)
   Acute Office Visit  Subjective:     Patient ID: Jo Parrish, female    DOB: 1926-02-26, 89 y.o.   MRN: [id number]   Ms Rori presents today with complaints of generalized itching. She reports symptoms throughout her body, minus her face. She relates steroid injection in her foot approximately 1 week ago. Upon discussion, patient relates history of generalized pruritus following steroid injections, has had similar symptoms twice in the past. She denies fever, rash, or skin color changes. She denies allergic reaction symptoms. She does endorse symptoms are slightly improved today. She appears well.      Review of Systems  Constitutional:  Negative for fever, malaise/fatigue and weight loss.  Respiratory:  Negative for cough and shortness of breath.   Musculoskeletal:  Positive for back pain and joint pain. Negative for myalgias.  Skin:  Positive for itching. Negative for rash.  Neurological:  Negative for dizziness, loss of consciousness and headaches.        Objective:     BP 118/72   Ht 5' 2 (1.575 m)   Wt 152 lb (68.9 kg)   BMI 27.80 kg/m   Physical Exam Constitutional:      Appearance: Normal appearance.  HENT:     Head: Normocephalic.     Mouth/Throat:     Mouth: Mucous membranes are moist.     Pharynx: Oropharynx is clear.  Eyes:     Extraocular Movements: Extraocular movements intact.     Conjunctiva/sclera: Conjunctivae normal.  Cardiovascular:     Rate and Rhythm: Normal rate and regular rhythm.     Heart sounds: Normal heart sounds. No murmur heard. Pulmonary:     Effort: Pulmonary effort is normal.     Breath sounds: Normal breath sounds. No wheezing or rales.  Musculoskeletal:     Left lower leg: No edema.  Skin:    General: Skin is warm and dry.     Findings: No erythema, lesion or rash.  Neurological:     General: No focal deficit present.     Mental Status: She is alert and oriented to person, place, and time.  Psychiatric:        Mood and  Affect: Mood normal.        Behavior: Behavior normal.     No results found for any visits on 07/15/24.      Assessment & Plan:  Generalized pruritus Assessment & Plan: Patient appears well today, in no acute distress. No rashes or skin erythema appreciated. Recommended patient to take previously prescribed hydroxyzine 25 mg at bed time for itching. Patient to return if she experiences fevers, rashes, or skin color changes.    Other orders -     Acetaminophen -Codeine ; Take 1 tablet by mouth every 6 (six) hours as needed for moderate pain (pain score 4-6).  Dispense: 30 tablet; Refill: 0    Return if symptoms worsen or fail to improve.  Charmaine Gurleen Larrivee, PA-C

## 2024-07-27 ENCOUNTER — Other Ambulatory Visit: Payer: Self-pay | Admitting: Cardiovascular Disease

## 2024-07-30 ENCOUNTER — Telehealth: Payer: Self-pay | Admitting: Physician Assistant

## 2024-07-30 ENCOUNTER — Ambulatory Visit: Payer: Self-pay

## 2024-07-30 NOTE — Telephone Encounter (Unsigned)
 Copied from CRM [id number]. Topic: Clinical - Medication Refill >> Jul 30, 2024 10:05 AM Avram MATSU wrote: Medication: atenolol  (TENORMIN ) 25 MG tablet [[id number]]  Has the patient contacted their pharmacy? Yes (Agent: If no, request that the patient contact the pharmacy for the refill. If patient does not wish to contact the pharmacy document the reason why and proceed with request.) (Agent: If yes, when and what did the pharmacy advise?)  This is the patient's preferred pharmacy:  [facility] [address], [place] - [address] [place] [address] Phone: [phone] Fax: [phone]  Is this the correct pharmacy for this prescription? Yes If no, delete pharmacy and type the correct one.   Has the prescription been filled recently? No  Is the patient out of the medication? No  Has the patient been seen for an appointment in the last year OR does the patient have an upcoming appointment? Yes  Can we respond through MyChart? No  Agent: Please be advised that Rx refills may take up to 3 business days. We ask that you follow-up with your pharmacy.

## 2024-07-30 NOTE — Telephone Encounter (Signed)
Patient notified of provider's recommendations and verbalized understanding.  

## 2024-07-30 NOTE — Telephone Encounter (Signed)
 FYI Only or Action Required?: Action required by provider: Patient requesting medication for her itchiness.  Patient was last seen in primary care on 07/15/24.  Called Nurse Triage reporting Pruritis.  Symptoms began several weeks ago.  Interventions attempted: Other: Has been using lidocaine  cream as suggested by another physician for pain.  Symptoms are: unchanged.  Triage Disposition: See PCP Within 2 Weeks  Patient/caregiver understands and will follow disposition?: No, wishes to speak with PCP        Copied from CRM #[id number]. Topic: Clinical - Red Word Triage >> Jul 30, 2024 10:16 AM Avram MATSU wrote: Red Word that prompted transfer to Nurse Triage: itching/burning all over body, neurophy itch and does not want to go to ER.         Reason for Disposition  Itching is a chronic symptom (recurrent or ongoing AND present > 4 weeks)  Answer Assessment - Initial Assessment Questions Patient reports she does not have any medication for her itchiness and would like something prescribed if possible. Patient was seen for the same on 07/15/24. Please advise.       1. DESCRIPTION: Describe the itching you are having.     Diffuse itching across body  2. SEVERITY: How bad is it?      Moderate  3. SCRATCHING: Are there any scratch marks? Bleeding?     No 4. ONSET: When did this begin? (e.g., minutes, hours, days ago)      3-4 weeks 5. CAUSE: What do you think is causing the itching? (ask about swimming pools, pollen, animals, soaps, etc.)     Reports history of neuropathy itch 6. OTHER SYMPTOMS: Do you have any other symptoms? (e.g., fever, rash)     Neuropathy pain with the itching  Protocols used: Itching - [facility]

## 2024-08-03 ENCOUNTER — Ambulatory Visit (INDEPENDENT_AMBULATORY_CARE_PROVIDER_SITE_OTHER): Admitting: Gastroenterology

## 2024-08-05 ENCOUNTER — Telehealth: Payer: Self-pay | Admitting: Cardiology

## 2024-08-05 DIAGNOSIS — M1712 Unilateral primary osteoarthritis, left knee: Secondary | ICD-10-CM | POA: Diagnosis not present

## 2024-08-05 MED ORDER — ATENOLOL 25 MG PO TABS: 25.0000 mg | ORAL_TABLET | Freq: Every day | ORAL | 1 refills | Status: AC

## 2024-08-05 NOTE — Telephone Encounter (Signed)
*  STAT* If patient is at the pharmacy, call can be transferred to refill team.   1. Which medications need to be refilled? (please list name of each medication and dose if known) atenolol  (TENORMIN ) 25 MG tablet    2. Would you like to learn more about the convenience, safety, & potential cost savings by using the [facility]?     3. Are you open to using the [facility] (Type [facility].  ).   4. Which pharmacy/location (including street and city if local pharmacy) is medication to be sent to? [id number] - [place], [place] - [address]    5. Do they need a 30 day or 90 day supply? 90 day

## 2024-08-11 ENCOUNTER — Encounter: Payer: Self-pay | Admitting: Physician Assistant

## 2024-08-11 ENCOUNTER — Ambulatory Visit (INDEPENDENT_AMBULATORY_CARE_PROVIDER_SITE_OTHER): Admitting: Physician Assistant

## 2024-08-11 VITALS — BP 122/64 | HR 78 | Temp 98.0°F | Ht 62.0 in | Wt 156.0 lb

## 2024-08-11 DIAGNOSIS — R600 Localized edema: Secondary | ICD-10-CM | POA: Diagnosis not present

## 2024-08-11 MED ORDER — OMEPRAZOLE 40 MG PO CPDR: 40.0000 mg | DELAYED_RELEASE_CAPSULE | Freq: Every day | ORAL | 1 refills | Status: DC

## 2024-08-11 NOTE — Progress Notes (Signed)
 Established Patient Office Visit  Subjective   Patient ID: Jo Parrish, female    DOB: 07/18/1926  Age: 89 y.o. MRN: [id number]  Chief Complaint  Patient presents with   Circulatory Problem    Concern for circulatory problem, swelling and pain     Discussed the use of AI scribe software for clinical note transcription with the patient, who gave verbal consent to proceed.  History of Present Illness Jo Parrish is a 89 year old female who presents with bilateral leg swelling and circulation concerns.  She experiences significant bilateral leg swelling daily for the past month or so, from her feet to her knees, with the left leg more affected. The swelling is tight and painful, with a sensation of potential bursting, worsening in the middle of the day after being on her feet and improving somewhat with rest. She has not used compression stockings for this episode but has them at home for neuropathy.  She has knee problems and recently underwent a series of gel injections in her knee over three weeks. Post-injection, she experienced numbness but noted some improvement in knee pain. She experiences leg cramps at night but denies skin color changes, open sores, or discharge from her legs.  She admits shortness of breath for over the last six months, which is not new, and denies any new chest pain or orthopnea. Her sleep is disturbed due to pain in her shoulder, back, and feet, leading to fatigue. She has been sleeping in a recliner, which typically can alleviate the swelling.    Review of Systems  Constitutional:  Negative for chills, fever and malaise/fatigue.  Respiratory:  Positive for shortness of breath. Negative for cough and wheezing.   Cardiovascular:  Positive for leg swelling. Negative for chest pain, palpitations, orthopnea and PND.  Musculoskeletal:  Positive for back pain and joint pain. Negative for myalgias.  Skin:  Negative for itching and rash.  Neurological:  Negative  for dizziness and headaches.  Psychiatric/Behavioral:  Negative for depression. The patient is not nervous/anxious.       Objective:     BP 122/64   Pulse 78   Temp 98 F (36.7 C)   Ht 5' 2 (1.575 m)   Wt 156 lb (70.8 kg)   SpO2 97%   BMI 28.53 kg/m    Physical Exam Constitutional:      Appearance: Normal appearance.  HENT:     Head: Normocephalic and atraumatic.     Mouth/Throat:     Mouth: Mucous membranes are moist.     Pharynx: Oropharynx is clear.  Eyes:     Extraocular Movements: Extraocular movements intact.     Conjunctiva/sclera: Conjunctivae normal.  Cardiovascular:     Rate and Rhythm: Normal rate and regular rhythm.     Heart sounds: Normal heart sounds. No murmur heard.    No gallop.  Pulmonary:     Effort: Pulmonary effort is normal.     Breath sounds: Normal breath sounds. No wheezing or rales.  Musculoskeletal:     Right lower leg: Swelling (mild) and tenderness present.     Left lower leg: Swelling (mild) and tenderness present.     Right ankle: Normal pulse.     Left ankle: Normal pulse.     Right foot: Normal capillary refill. No swelling. Normal pulse.     Left foot: Normal capillary refill. No swelling. Normal pulse.  Skin:    General: Skin is warm and dry.  Findings: No erythema, petechiae or rash.  Neurological:     General: No focal deficit present.     Mental Status: She is alert and oriented to person, place, and time.  Psychiatric:        Mood and Affect: Mood normal.        Behavior: Behavior normal.     No results found for any visits on 08/11/24.  The ASCVD Risk score (Arnett DK, et al., 2019) failed to calculate for the following reasons:   The 2019 ASCVD risk score is only valid for ages 7 to 36    Assessment & Plan:   Return in about 5 months (around 01/11/2025) for wellness .   Lower extremity edema Assessment & Plan: Chronic edema likely due to venous insufficiency, DVT unlikely due to bilateral nature of  symptoms, lack of skin color changes, localized swelling, or pain.  Doubt cardiac etiology as patient denies new or worsening shortness of breath, chest pain, or orthopnea.  - BMP and BNP too rule our cardiac etiology or electrolyte disturbances.  - Encourage leg elevation when sitting. - Advise increased water intake. - Recommend wearing compression stockings when active. - Monitor and follow up for worsening swelling, weight gain, or new symptoms such as chest pain or shortness of breath.   Orders: -     Basic metabolic panel with GFR -     Brain natriuretic peptide  Other orders -     Omeprazole ; Take 1 capsule (40 mg total) by mouth daily. Take daily for acid reflux.  Dispense: 90 capsule; Refill: 1    Abdoul Encinas, PA-C

## 2024-08-11 NOTE — Assessment & Plan Note (Signed)
 Chronic edema likely due to venous insufficiency, DVT unlikely due to bilateral nature of symptoms, lack of skin color changes, localized swelling, or pain.  Doubt cardiac etiology as patient denies new or worsening shortness of breath, chest pain, or orthopnea.  - BMP and BNP too rule our cardiac etiology or electrolyte disturbances.  - Encourage leg elevation when sitting. - Advise increased water intake. - Recommend wearing compression stockings when active. - Monitor and follow up for worsening swelling, weight gain, or new symptoms such as chest pain or shortness of breath.

## 2024-08-12 DIAGNOSIS — M1712 Unilateral primary osteoarthritis, left knee: Secondary | ICD-10-CM | POA: Diagnosis not present

## 2024-08-12 DIAGNOSIS — R600 Localized edema: Secondary | ICD-10-CM | POA: Diagnosis not present

## 2024-08-13 ENCOUNTER — Ambulatory Visit: Payer: Self-pay | Admitting: Physician Assistant

## 2024-08-13 DIAGNOSIS — I10 Essential (primary) hypertension: Secondary | ICD-10-CM

## 2024-08-13 LAB — BASIC METABOLIC PANEL WITH GFR
BUN/Creatinine Ratio: 15 (ref 12–28)
BUN: 15 mg/dL (ref 10–36)
CO2: 23 mmol/L (ref 20–29)
Calcium: 9.6 mg/dL (ref 8.7–10.3)
Chloride: 101 mmol/L (ref 96–106)
Creatinine, Ser: 1.02 mg/dL — ABNORMAL HIGH (ref 0.57–1.00)
Glucose: 111 mg/dL — ABNORMAL HIGH (ref 70–99)
Potassium: 5.1 mmol/L (ref 3.5–5.2)
Sodium: 139 mmol/L (ref 134–144)
eGFR: 50 mL/min/1.73 — ABNORMAL LOW (ref >59)

## 2024-08-13 LAB — BRAIN NATRIURETIC PEPTIDE: BNP: 68.7 pg/mL (ref 0.0–100.0)

## 2024-08-14 ENCOUNTER — Other Ambulatory Visit: Payer: Self-pay

## 2024-08-14 MED ORDER — OMEPRAZOLE 40 MG PO CPDR
40.0000 mg | DELAYED_RELEASE_CAPSULE | Freq: Every day | ORAL | 1 refills | Status: AC
Start: 2024-08-14 — End: ?

## 2024-08-19 DIAGNOSIS — M1712 Unilateral primary osteoarthritis, left knee: Secondary | ICD-10-CM | POA: Diagnosis not present

## 2024-08-27 ENCOUNTER — Telehealth: Payer: Self-pay

## 2024-08-27 NOTE — Telephone Encounter (Signed)
-  I called and left a message for the patient to return the call in order to get more information/ clarification of the request-  Copied from CRM #[id number]. Topic: Clinical - Request for Lab/Test Order >> Aug 27, 2024  9:32 AM Jo Parrish wrote: Reason for CRM: Pt is requesting a B12 shot. The patient was getting them at [facility].  Patient will not be available today at 1:30 pm. Please contact the patient at [phone]

## 2024-08-27 NOTE — Telephone Encounter (Signed)
-  spoke with patient she states she takes b12 monthly for the past 5 yrs due to autoimmune disease sjogrens, also takes 1000 mcg daily by mouth , please advsie  Copied from CRM [id number]. Topic: General - Call Back - No Documentation >> Aug 27, 2024  2:35 PM Gustabo D wrote: Missed call returning a call regarding b12 shot

## 2024-08-28 NOTE — Telephone Encounter (Signed)
Patient notified of provider's recommendations and verbalized understanding.  

## 2024-09-08 ENCOUNTER — Other Ambulatory Visit (HOSPITAL_COMMUNITY): Payer: Self-pay | Admitting: Physical Medicine and Rehabilitation

## 2024-09-08 DIAGNOSIS — M79605 Pain in left leg: Secondary | ICD-10-CM

## 2024-09-08 DIAGNOSIS — R52 Pain, unspecified: Secondary | ICD-10-CM

## 2024-09-09 ENCOUNTER — Ambulatory Visit (HOSPITAL_COMMUNITY)
Admission: RE | Admit: 2024-09-09 | Discharge: 2024-09-09 | Disposition: A | Discharge status: Home / Self Care | Source: Ambulatory Visit | Attending: Vascular Surgery | Admitting: Vascular Surgery

## 2024-09-09 DIAGNOSIS — M79605 Pain in left leg: Secondary | ICD-10-CM | POA: Diagnosis not present

## 2024-09-09 DIAGNOSIS — R52 Pain, unspecified: Secondary | ICD-10-CM | POA: Diagnosis not present

## 2024-09-10 ENCOUNTER — Telehealth: Payer: Self-pay | Admitting: Cardiology

## 2024-09-10 ENCOUNTER — Ambulatory Visit: Attending: Cardiology | Admitting: Cardiology

## 2024-09-10 ENCOUNTER — Encounter: Payer: Self-pay | Admitting: Cardiology

## 2024-09-10 VITALS — BP 147/66 | HR 73 | Resp 16 | Ht 62.0 in | Wt 157.0 lb

## 2024-09-10 DIAGNOSIS — R0602 Shortness of breath: Secondary | ICD-10-CM | POA: Diagnosis not present

## 2024-09-10 DIAGNOSIS — G4733 Obstructive sleep apnea (adult) (pediatric): Secondary | ICD-10-CM

## 2024-09-10 DIAGNOSIS — R072 Precordial pain: Secondary | ICD-10-CM | POA: Diagnosis not present

## 2024-09-10 DIAGNOSIS — I10 Essential (primary) hypertension: Secondary | ICD-10-CM | POA: Diagnosis not present

## 2024-09-10 DIAGNOSIS — I5022 Chronic systolic (congestive) heart failure: Secondary | ICD-10-CM

## 2024-09-10 DIAGNOSIS — E782 Mixed hyperlipidemia: Secondary | ICD-10-CM | POA: Diagnosis not present

## 2024-09-10 MED ORDER — NITROGLYCERIN 0.4 MG SL SUBL: 0.4000 mg | SUBLINGUAL_TABLET | SUBLINGUAL | 4 refills | Status: AC | PRN

## 2024-09-10 NOTE — Telephone Encounter (Signed)
 Pt c/o swelling/edema: STAT if pt has developed SOB within 24 hours  If swelling, where is the swelling located? Legs and feet   How much weight have you gained and in what time span? Yes about 5 pounds over the last month.  Have you gained 2 pounds in a day or 5 pounds in a week? No   Do you have a log of your daily weights (if so, list)? No  Are you currently taking a fluid pill? No  Are you currently SOB? Yes   Have you traveled recently in a car or plane for an extended period of time? No   Pt c/o Shortness Of Breath: STAT if SOB developed within the last 24 hours or pt is noticeably SOB on the phone  1. Are you currently SOB (can you hear that pt is SOB on the phone)? Yes  2. How long have you been experiencing SOB? Started this month and continuously worsening   3. Are you SOB when sitting or when up moving around? Both   4. Are you currently experiencing any other symptoms? Swelling    Call transferred.

## 2024-09-10 NOTE — Patient Instructions (Addendum)
 Medication Instructions:   May use Nitroglycerin   as needed for chest discomfort  *If you need a refill on your cardiac medications before your next appointment, please call your pharmacy*   Lab Work: BMP today   If you have labs (blood work) drawn today and your tests are completely normal, you will receive your results only by: MyChart Message (if you have MyChart) OR A paper copy in the mail If you have any lab test that is abnormal or we need to change your treatment, we will call you to review the results.   Testing/Procedures: 1) Your physician has requested that you have an echocardiogram. Echocardiography is a painless test that uses sound waves to create images of your heart. It provides your doctor with information about the size and shape of your heart and how well your heart's chambers and valves are working. This procedure takes approximately one hour. There are no restrictions for this procedure. Please do NOT wear cologne, perfume, aftershave, or lotions (deodorant is allowed). Please arrive 15 minutes prior to your appointment time.  Please note: We ask at that you not bring children with you during ultrasound (echo/ vascular) testing. Due to room size and safety concerns, children are not allowed in the ultrasound rooms during exams. Our front office staff cannot provide observation of children in our lobby area while testing is being conducted. An adult accompanying a patient to their appointment will only be allowed in the ultrasound room at the discretion of the ultrasound technician under special circumstances. We apologize for any inconvenience.    2) Your physician has requested that you have coronary  CTA. Coronary computed tomography (CT)angiogram  is a special type of CT scan that uses a computer to produce multi-dimensional views of major blood vessels throughout the heart.  CT angiography, a contrast material is injected through an IV to help visualize the blood  vessels  a painless test that uses an x-ray machine to take clear, detailed pictures of your heart arteries .  Please follow instruction sheet as given.  Follow-Up: At [facility], you and your health needs are our priority.  As part of our continuing mission to provide you with exceptional heart care, we have created designated Provider Care Teams.  These Care Teams include your primary Cardiologist (physician) and Advanced Practice Providers (APPs -  Physician Assistants and Nurse Practitioners) who all work together to provide you with the care you need, when you need it.     Your next appointment:   3 to4 week(s)  The format for your next appointment:   In Person  Provider:   Dorn Lesches, MD   Other Instructions    Your cardiac CT will be scheduled at  the below locations:    [facility] [address]  [place], [place] [address] [phone]      If scheduled at the [facility] at [facility] street, please enter the parking lot using the [address] entrance and use the FREE valet service at the patient drop-off area. Enter the building and check-in with registration on the main floor.   Please follow these instructions carefully (unless otherwise directed):  An IV will be required for this test and Nitroglycerin  will be given.    On the Night Before the Test: Be sure to Drink plenty of water. Do not consume any caffeinated/decaffeinated beverages or chocolate 12 hours prior to your test. Do not take any antihistamines 12 hours prior  to your test.    On the Day of the Test: Drink plenty of water until 1 hour prior to the test. Do not eat any food 1 hour prior to test. You may take your regular medications prior to the test.  Take 2 tablets( total 50 mg)  Atenolol  two hours prior to test.. FEMALES- please wear underwire-free bra if available, avoid dresses & tight clothing            After the Test: Drink  plenty of water. After receiving IV contrast, you may experience a mild flushed feeling. This is normal. On occasion, you may experience a mild rash up to 24 hours after the test. This is not dangerous. If this occurs, you can take Benadryl  25 mg, Zyrtec, Claritin, or Allegra and increase your fluid intake. (Patients taking Tikosyn should avoid Benadryl , and may take Zyrtec, Claritin, or Allegra) If you experience trouble breathing, this can be serious. If it is severe call 911 IMMEDIATELY. If it is mild, please call our office.  We will call to schedule your test 2-4 weeks out understanding that some insurance companies will need an authorization prior to the service being performed.   For more information and frequently asked questions, please visit our website : [email or web address]  For non-scheduling related questions, please contact the cardiac imaging nurse navigator should you have any questions/concerns: Cardiac Imaging Nurse Navigators Direct Office Dial: [phone]   For scheduling needs, including cancellations and rescheduling, please call [place], [phone].

## 2024-09-10 NOTE — Progress Notes (Signed)
 Cardiology Office Note:  .   Date:  09/10/2024  ID:  SYANNE LOONEY, DOB [birth year], MRN [id number] PCP:  Grooms, Charmaine RIGGERS  [facility] Providers Cardiologist:  Dorn Lesches, MD  Electrophysiologist:  None  Click to update primary MD,subspecialty MD or APP then REFRESH:1}    Chief Complaint  Patient presents with   Shortness of Breath   New Patient (Initial Visit)   Chest Pain    Chest tightness    History of Present Illness: .   Jo Parrish is a 89 y.o. Caucasian female whose past medical history and cardiovascular risk factors includes: Chronic heart failure with mildly reduced LVEF, hypertension, hyperlipidemia, advanced age.  Patient presents today for a sick visit due to continued shortness of breath and chest tightness.  I am seeing her as a DOD visit.  Patient follows with Dr. Dorn Lesches for longitudinal cardiovascular care and was last seen in January 2025, progress note reviewed.  Patient is accompanied by her friend at today's office visit who provides verbal consent with having her present.  Patient states that earlier this week she was scheduled to have a back injection but due to her shortness of breath and lower extremity swelling the injection was canceled and she had a lower extremity venous duplex study which noted no evidence of DVT.  She made the appointment today due to progressive shortness of breath and chest tightness.  Shortness of breath: Ongoing for the last 1 month. Worse in intensity, frequency, and duration. Brought on by effort related activities. Denies heart failure symptoms. No significant change in weight.  Chest tightness: Noticeable over the last 1 week. Worse in frequency. Substernally located. Intensity 4 out of 10. Occurs intermittently. Not brought on by effort related activities, of note functional capacity is limited. Better with resting and relaxing  Patient lives alone.  States that the power of attorney is  with her ex daughter-in-law.  Review of Systems: .   Review of Systems  Constitutional: Positive for malaise/fatigue.  Cardiovascular:  Positive for chest pain, dyspnea on exertion and leg swelling. Negative for claudication, irregular heartbeat, near-syncope, orthopnea, palpitations, paroxysmal nocturnal dyspnea and syncope.  Respiratory:  Negative for shortness of breath.   Hematologic/Lymphatic: Negative for bleeding problem.    Studies Reviewed:   EKG: EKG Interpretation Date/Time:  Thursday September 10 2024 11:39:26 EDT Ventricular Rate:  72 PR Interval:  178 QRS Duration:  136 QT Interval:  404 QTC Calculation: 442 R Axis:   52  Text Interpretation: Normal sinus rhythm Left bundle branch block T wave abnormality, consider inferolateral ischemia When compared with ECG of 25-Dec-2023 14:24, T wave inversion now evident in Inferior leads T wave inversion less evident in Lateral leads Confirmed by Michele Richardson [phone]) on 09/10/2024 12:13:33 PM  Risk Assessment/Calculations:   NA   Labs:       Latest Ref Rng & Units 06/15/2024    2:24 PM 12/13/2022    9:48 AM 06/29/2021   10:37 PM  CBC  WBC 3.4 - 10.8 x10E3/uL 6.6  5.2  5.5   Hemoglobin 11.1 - 15.9 g/dL 86.0  86.6  87.1   Hematocrit 34.0 - 46.6 % 42.0  38.8  37.4   Platelets 150 - 450 x10E3/uL 233  222  206        Latest Ref Rng & Units 08/12/2024    9:28 AM 06/15/2024    2:24 PM 12/13/2022    9:48 AM  BMP  Glucose 70 - 99  mg/dL 888  80  896   BUN 10 - 36 mg/dL 15  19  19    Creatinine 0.57 - 1.00 mg/dL 8.97  9.13  9.10   BUN/Creat Ratio 12 - 28 15  22     Sodium 134 - 144 mmol/L 139  137  140   Potassium 3.5 - 5.2 mmol/L 5.1  5.1  4.0   Chloride 96 - 106 mmol/L 101  100  106   CO2 20 - 29 mmol/L 23  24  26    Calcium 8.7 - 10.3 mg/dL 9.6  89.9  9.5       Latest Ref Rng & Units 08/12/2024    9:28 AM 06/15/2024    2:24 PM 12/13/2022    9:48 AM  CMP  Glucose 70 - 99 mg/dL 888  80  896   BUN 10 - 36 mg/dL 15  19   19    Creatinine 0.57 - 1.00 mg/dL 8.97  9.13  9.10   Sodium 134 - 144 mmol/L 139  137  140   Potassium 3.5 - 5.2 mmol/L 5.1  5.1  4.0   Chloride 96 - 106 mmol/L 101  100  106   CO2 20 - 29 mmol/L 23  24  26    Calcium 8.7 - 10.3 mg/dL 9.6  89.9  9.5   Total Protein 6.0 - 8.5 g/dL  6.6    Total Bilirubin 0.0 - 1.2 mg/dL  0.4    Alkaline Phos 44 - 121 IU/L  53    AST 0 - 40 IU/L  27    ALT 0 - 32 IU/L  23      Lab Results  Component Value Date   CHOL 191 06/15/2024   HDL 67 06/15/2024   LDLCALC 98 06/15/2024   TRIG 152 (H) 06/15/2024   CHOLHDL 2.9 06/15/2024   No results for input(s): LIPOA in the last 8760 hours. No components found for: NTPROBNP No results for input(s): PROBNP in the last 8760 hours. Recent Labs    06/15/24 1424  TSH 0.307*    Physical Exam:    Today's Vitals   09/10/24 1128  BP: (!) 147/66  Pulse: 73  Resp: 16  SpO2: 96%  Weight: 157 lb (71.2 kg)  Height: 5' 2 (1.575 m)   Body mass index is 28.72 kg/m. Wt Readings from Last 3 Encounters:  09/10/24 157 lb (71.2 kg)  08/11/24 156 lb (70.8 kg)  07/15/24 152 lb (68.9 kg)    Physical Exam  Constitutional: No distress. She appears chronically ill.  hemodynamically stable  Neck: No JVD present.  Cardiovascular: Normal rate, regular rhythm, S1 normal and S2 normal. Exam reveals no gallop, no S3 and no S4.  No murmur heard. Pulmonary/Chest: Effort normal and breath sounds normal. No stridor. She has no wheezes. She has no rales.  Musculoskeletal:        General: Edema present.     Cervical back: Neck supple.  Skin: Skin is warm.   Impression & Recommendation(s):  Impression:   ICD-10-CM   1. Shortness of breath  R06.02 EKG 12-Lead    Basic metabolic panel with GFR    CT CORONARY MORPH W/CTA COR W/SCORE W/CA W/CM &/OR WO/CM    ECHOCARDIOGRAM COMPLETE    2. Precordial pain  R07.2 nitroGLYCERIN  (NITROSTAT ) 0.4 MG SL tablet    CT CORONARY MORPH W/CTA COR W/SCORE W/CA W/CM &/OR WO/CM     ECHOCARDIOGRAM COMPLETE    3. Chronic heart failure with mildly  reduced ejection fraction (HFmrEF, 41-49%) (HCC)  I50.22     4. Essential hypertension  I10     5. Mixed hyperlipidemia  E78.2     6. OSA (obstructive sleep apnea)  G47.33        Recommendation(s):  Shortness of breath Precordial pain Her precordial discomfort suggestive of possible cardiac etiology Given the progressive symptoms of precordial tightness and shortness of breath with effort related activities, risk factors, an EKG today illustrates ST depressions in inferolateral leads recommended ischemic evaluation. Recommended going to the ER today for more expedited evaluation, patient refuses. Given her age at 26 we discussed the risks, benefits, alternatives, and limitations of both left heart catheterization, stress test, and coronary CTA. Given her pretest probability being high for CAD would like to avoid stress testing the patient at this time. Patient wants to hold off on left heart catheterization with possible intervention at this time given her age.  But willing to proceed forward if necessary based on workup or progressive symptoms.  Shared decision was to proceed forward with coronary CTA to evaluate for obstructive disease. Check BMP She is on atenolol  25 mg p.o. daily, on the day of her study recommended taking 2 tablets the morning of the coronary CTA. Sublingual nitroglycerin  tablets to use on as needed basis Continue aspirin 81 mg p.o. daily. Continue lipid-lowering agents. However, if her chest tightness or shortness of breath gets progressively worse with regards to intensity frequency or duration she is advised to go to the closest ER via EMS.  Patient verbalized understanding.  Chronic heart failure with mildly reduced ejection fraction (HFmrEF, 41-49%) (HCC) On physical examination not overtly volume overloaded. Will work on ischemic evaluation at this time. Continue Tenormin  25 mg p.o.  daily. Continue Avapro  300 mg p.o. daily.  Mixed hyperlipidemia Currently on Zetia  10 mg p.o. daily. Will defer lipid management to primary cardiology/primary team. May reconsider pharmacological therapy based on workup results  OSA (obstructive sleep apnea) Endorses compliance with her CPAP.   Orders Placed:  Orders Placed This Encounter  Procedures   CT CORONARY MORPH W/CTA COR W/SCORE W/CA W/CM &/OR WO/CM    Standing Status:   Future    Expiration Date:   09/10/2025    If indicated for the ordered procedure, I authorize the administration of contrast media per Radiology protocol:   Yes    [facility]   Basic metabolic panel with GFR   EKG 87-Ozji   ECHOCARDIOGRAM COMPLETE    Standing Status:   Future    Expiration Date:   09/10/2025    Where should this test [facility] performed[facility]    Does the patient weigh less than or greater than 250 lbs?:   Patient weighs less than 250 lbs    Perflutren DEFINITY (image enhancing agent) should be administered unless hypersensitivity or allergy exist:   Administer Perflutren    Reason for exam-Echo:   Chest Pain  R07.9     Final Medication List:    Meds ordered this encounter  Medications   nitroGLYCERIN  (NITROSTAT ) 0.4 MG SL tablet    Sig: Place 1 tablet (0.4 mg total) under the tongue every 5 (five) minutes as needed for chest pain.    Dispense:  25 tablet    Refill:  4    There are no discontinued medications.   Current Outpatient Medications:    acetaminophen -codeine  (TYLENOL  #3) 300-30 MG tablet, Take 1 tablet  by mouth every 6 (six) hours as needed for moderate pain (pain score 4-6)., Disp: 30 tablet, Rfl: 0   amLODipine  (NORVASC ) 10 MG tablet, Take 1 tablet by mouth once daily, Disp: 90 tablet, Rfl: 3   aspirin 81 MG tablet, Take 81 mg by mouth daily., Disp: , Rfl:    atenolol  (TENORMIN ) 25 MG tablet, Take 1 tablet (25 mg total) by mouth daily., Disp: 90 tablet, Rfl: 1    Biotin (BIOTIN 5000) 5 MG CAPS, Take 5 mg by mouth daily., Disp: , Rfl:    celecoxib (CELEBREX) 100 MG capsule, Take 100 mg by mouth 2 (two) times daily as needed., Disp: , Rfl:    cholecalciferol (VITAMIN D3) 25 MCG (1000 UNIT) tablet, Take 1,000 Units by mouth daily., Disp: , Rfl:    Cyanocobalamin  (VITAMIN B-12 IJ), Inject 1 Dose as directed every 30 (thirty) days., Disp: , Rfl:    cyanocobalamin  (VITAMIN B12) 1000 MCG tablet, Take 5,000 mcg by mouth daily., Disp: , Rfl:    diazepam (VALIUM) 10 MG tablet, Take 10 mg by mouth every 6 (six) hours as needed for anxiety. 1/4 -1/2 of tablet two to three times per week., Disp: , Rfl:    ezetimibe  (ZETIA ) 10 MG tablet, TAKE 1 TABLET BY MOUTH AT BEDTIME, Disp: 90 tablet, Rfl: 3   fluticasone (FLONASE) 50 MCG/ACT nasal spray, Place 2 sprays into both nostrils daily as needed for allergies., Disp: , Rfl:    irbesartan  (AVAPRO ) 300 MG tablet, Take 1 tablet by mouth once daily, Disp: 90 tablet, Rfl: 3   levothyroxine  (SYNTHROID ) 112 MCG tablet, Take 112 mcg by mouth daily., Disp: , Rfl:    nitroGLYCERIN  (NITROSTAT ) 0.4 MG SL tablet, Place 1 tablet (0.4 mg total) under the tongue every 5 (five) minutes as needed for chest pain., Disp: 25 tablet, Rfl: 4   omeprazole  (PRILOSEC) 40 MG capsule, Take 1 capsule (40 mg total) by mouth daily. Take daily for acid reflux., Disp: 90 capsule, Rfl: 1   ondansetron  (ZOFRAN -ODT) 4 MG disintegrating tablet, Take 1 tablet (4 mg total) by mouth every 8 (eight) hours as needed for nausea or vomiting., Disp: 10 tablet, Rfl: 0   Polyethyl Glycol-Propyl Glycol (SYSTANE OP), Place 1 drop into both eyes daily as needed (dry eyes)., Disp: , Rfl:    Prucalopride Succinate  (MOTEGRITY ) 2 MG TABS, Take 1 tablet (2 mg total) by mouth daily., Disp: 30 tablet, Rfl: 1  Consent:   NA  Disposition:   3-4 weeks w/ Dr. Court  Her questions and concerns were addressed to her satisfaction. She voices understanding of the recommendations  provided during this encounter.    Signed, Madonna Michele HAS, [facility]  A Division of [facility] [address], [place], [place] [address]  09/10/2024 1:20 PM

## 2024-09-10 NOTE — Telephone Encounter (Signed)
 Pt is very short of breath. She was recently seen due to swelling yesterday to see if she has a blood clot- and it was negative.   She would like to be seen today if possible and does not feel like she needs to go to the ER, just needs to be seen.   Scheduled to see DOD- Dr Michele today at 11:40. Told of address [address]- told to come to 5 th floor.

## 2024-09-11 ENCOUNTER — Ambulatory Visit: Payer: Self-pay

## 2024-09-11 LAB — BASIC METABOLIC PANEL WITH GFR
BUN/Creatinine Ratio: 25 (ref 12–28)
BUN: 21 mg/dL (ref 10–36)
CO2: 22 mmol/L (ref 20–29)
Calcium: 9.8 mg/dL (ref 8.7–10.3)
Chloride: 105 mmol/L (ref 96–106)
Creatinine, Ser: 0.84 mg/dL (ref 0.57–1.00)
Glucose: 91 mg/dL (ref 70–99)
Potassium: 5.1 mmol/L (ref 3.5–5.2)
Sodium: 142 mmol/L (ref 134–144)
eGFR: 64 mL/min/1.73 (ref >59)

## 2024-09-11 NOTE — Telephone Encounter (Signed)
 FYI Only or Action Required?: Action required by provider: request for appointment and referral request.  Patient was last seen in primary care on 08/11/2024 by Grooms, Celina, PA-C.  Called Nurse Triage reporting Leg Swelling.  Symptoms began about a month ago.  Interventions attempted: Other: was seen by cardiologist yesterday.  Symptoms are: unchanged.  Triage Disposition: See Physician Within 24 Hours  Patient/caregiver understands and will follow disposition?: Yes         Copied from CRM #[id number]. Topic: Clinical - Red Word Triage >> Sep 11, 2024  1:40 PM Charlet HERO wrote: Red Word that prompted transfer to Nurse Triage: Patient is calling about her leg was swollen so bad that she was not able to get shot for her foot in her back. She is stating that the foot doctor wanted her to be checked for blood clot but the provider stated that she was so short of breath that she should contact her cardiologist and she did have a cardiogram done and it was different than the last one she had. She would like to have  Courtney to help her find a doctor for her leg which it is going to her other leg as well. She states that they have red spots. Her legs get so hot that she puts them in cold water and it turns the water warm. RFM          Reason for Disposition  [1] MODERATE leg swelling (e.g., swelling extends up to knees) AND [2] new-onset or getting worse  Answer Assessment - Initial Assessment Questions Patient was seen by her cardiologist yesterday and recently had an ultrasound to rule out DVT. Patient would like a referral to someone who could help with her leg swelling and pain, and would like to get an appointment when available. Please advise.     1. ONSET: When did the swelling start? (e.g., minutes, hours, days)     Over a month  2. LOCATION: What part of the leg is swollen?  Are both legs swollen or just one leg?     Bilateral, left worse than right  3.  SEVERITY: How bad is the swelling? (e.g., localized; mild, moderate, severe)     Moderate  4. REDNESS: Is there redness or signs of infection?     Some red spots  5. PAIN: Is the swelling painful to touch? If Yes, ask: How painful is it?   (Scale 1-10; mild, moderate or severe)     Moderate  6. FEVER: Do you have a fever? If Yes, ask: What is it, how was it measured, and when did it start?      No 7. CAUSE: What do you think is causing the leg swelling?     Unsure  8. MEDICAL HISTORY: Do you have a history of blood clots (e.g., DVT), cancer, heart failure, kidney disease, or liver failure?     Heart failure  9. RECURRENT SYMPTOM: Have you had leg swelling before? If Yes, ask: When was the last time? What happened that time?     First occurred about 6 months ago  10. OTHER SYMPTOMS: Do you have any other symptoms? (e.g., chest pain, difficulty breathing)       Shortness of breath  Protocols used: Leg Swelling and Edema-A-AH

## 2024-09-14 NOTE — Telephone Encounter (Signed)
 Appointment scheduled 09/15/2024 at 2:20 PM with Charmaine Grooms PA- patient notified

## 2024-09-15 ENCOUNTER — Encounter: Payer: Self-pay | Admitting: Physician Assistant

## 2024-09-15 ENCOUNTER — Ambulatory Visit (INDEPENDENT_AMBULATORY_CARE_PROVIDER_SITE_OTHER): Payer: Self-pay | Admitting: Physician Assistant

## 2024-09-15 VITALS — BP 138/84 | HR 83 | Resp 18 | Ht 62.0 in | Wt 156.0 lb

## 2024-09-15 DIAGNOSIS — R600 Localized edema: Secondary | ICD-10-CM | POA: Diagnosis not present

## 2024-09-15 DIAGNOSIS — R0602 Shortness of breath: Secondary | ICD-10-CM | POA: Diagnosis not present

## 2024-09-15 NOTE — Assessment & Plan Note (Signed)
 Chronic left leg edema likely due to venous insufficiency with significant swelling and skin color changes. No blood clots found. She opted to wait for cardiac evaluation results before consulting a vascular specialist. - Elevate legs at home. - Wear compression stockings when active.

## 2024-09-15 NOTE — Progress Notes (Signed)
 Acute Office Visit  Subjective:     Patient ID: Jo Parrish, female    DOB: [birth year], 89 y.o.   MRN: [id number]   Discussed the use of AI scribe software for clinical note transcription with the patient, who gave verbal consent to proceed.  History of Present Illness Jo Parrish is a 89 year old female who presents with leg swelling and shortness of breath.  She has had recent episodes of significant swelling in her left leg. The swelling is described as a tight feeling and patient describes skin color changes on her lower legs. Recent US  for DVT was negative. The swelling has persisted since last Tuesday.   She experiences shortness of breath since earlier this, worsening with activity but improving when sitting. She was seen at her cardiologist's office on 9/25. There are changes in her EKG from the previous year, prompting further cardiac evaluation. She is scheduled for a CTA and echo on Thursday. No current chest pain, but stress exacerbates her symptoms. She feels improved today. She does not have any new complaints, however prefers to follow up for continuity of care and to have questions answered.     Review of Systems  Constitutional:  Positive for malaise/fatigue. Negative for fever and weight loss.  Respiratory:  Positive for shortness of breath. Negative for cough and wheezing.   Cardiovascular:  Positive for leg swelling. Negative for chest pain and palpitations.        Objective:     BP 138/84   Pulse 83   Resp 18   Ht 5' 2 (1.575 m)   Wt 156 lb (70.8 kg)   SpO2 93%   BMI 28.53 kg/m   Physical Exam Constitutional:      General: She is not in acute distress.    Appearance: Normal appearance. She is not ill-appearing.  HENT:     Head: Normocephalic and atraumatic.     Mouth/Throat:     Mouth: Mucous membranes are moist.     Pharynx: Oropharynx is clear.  Eyes:     Extraocular Movements: Extraocular movements intact.     Conjunctiva/sclera:  Conjunctivae normal.  Cardiovascular:     Rate and Rhythm: Normal rate and regular rhythm.     Heart sounds: Normal heart sounds. No murmur heard.    No friction rub.  Pulmonary:     Effort: Pulmonary effort is normal.     Breath sounds: Normal breath sounds. No rales.  Musculoskeletal:     Right lower leg: Edema (minimal) present.     Left lower leg: Edema (minimal) present.  Skin:    General: Skin is warm and dry.  Neurological:     General: No focal deficit present.     Mental Status: She is alert and oriented to person, place, and time.  Psychiatric:        Mood and Affect: Mood normal.        Behavior: Behavior normal.     No results found for any visits on 09/15/24.      Assessment & Plan:  SOB (shortness of breath) Assessment & Plan: Shortness of breath for one month, worsening with exertion. EKG changes prompted further cardiac evaluation. Scheduled for CTA and echocardiogram to assess cardiac function and rule out significant dysfunction. Awaiting results before proceeding with back injection. - Continue with CTA and echo. - Review test results and provide clearance for back injection if normal. - Advise ER visit if shortness of breath worsens or chest  pain occurs   Lower extremity edema Assessment & Plan: Chronic left leg edema likely due to venous insufficiency with significant swelling and skin color changes. No blood clots found. She opted to wait for cardiac evaluation results before consulting a vascular specialist. - Elevate legs at home. - Wear compression stockings when active.     Return as scheduled in Feb or sooner as needed.  Charmaine Jadore Mcguffin, PA-C

## 2024-09-15 NOTE — Assessment & Plan Note (Signed)
 Shortness of breath for one month, worsening with exertion. EKG changes prompted further cardiac evaluation. Scheduled for CTA and echocardiogram to assess cardiac function and rule out significant dysfunction. Awaiting results before proceeding with back injection. - Continue with CTA and echo. - Review test results and provide clearance for back injection if normal. - Advise ER visit if shortness of breath worsens or chest pain occurs

## 2024-09-17 ENCOUNTER — Ambulatory Visit (HOSPITAL_BASED_OUTPATIENT_CLINIC_OR_DEPARTMENT_OTHER)
Admission: RE | Admit: 2024-09-17 | Discharge: 2024-09-17 | Disposition: A | Discharge status: Home / Self Care | Source: Ambulatory Visit | Attending: Cardiology | Admitting: Cardiology

## 2024-09-17 ENCOUNTER — Other Ambulatory Visit: Payer: Self-pay | Admitting: Cardiology

## 2024-09-17 ENCOUNTER — Ambulatory Visit (HOSPITAL_COMMUNITY)
Admission: RE | Admit: 2024-09-17 | Discharge: 2024-09-17 | Disposition: A | Discharge status: Home / Self Care | Source: Ambulatory Visit | Attending: Cardiology | Admitting: Cardiology

## 2024-09-17 DIAGNOSIS — I2584 Coronary atherosclerosis due to calcified coronary lesion: Secondary | ICD-10-CM | POA: Insufficient documentation

## 2024-09-17 DIAGNOSIS — R931 Abnormal findings on diagnostic imaging of heart and coronary circulation: Secondary | ICD-10-CM | POA: Diagnosis not present

## 2024-09-17 DIAGNOSIS — I2510 Atherosclerotic heart disease of native coronary artery without angina pectoris: Secondary | ICD-10-CM | POA: Insufficient documentation

## 2024-09-17 DIAGNOSIS — R0602 Shortness of breath: Secondary | ICD-10-CM | POA: Insufficient documentation

## 2024-09-17 DIAGNOSIS — R072 Precordial pain: Secondary | ICD-10-CM | POA: Diagnosis not present

## 2024-09-17 DIAGNOSIS — I700 Atherosclerosis of aorta: Secondary | ICD-10-CM | POA: Diagnosis not present

## 2024-09-17 DIAGNOSIS — I209 Angina pectoris, unspecified: Secondary | ICD-10-CM | POA: Diagnosis not present

## 2024-09-17 MED ORDER — NITROGLYCERIN 0.4 MG SL SUBL
0.8000 mg | SUBLINGUAL_TABLET | Freq: Once | SUBLINGUAL | Status: AC
  Administered 2024-09-17: 0.8 mg via SUBLINGUAL

## 2024-09-17 MED ORDER — IOHEXOL 350 MG/ML SOLN
100.0000 mL | Freq: Once | INTRAVENOUS | Status: AC | PRN
  Administered 2024-09-17: 100 mL via INTRAVENOUS

## 2024-09-22 ENCOUNTER — Telehealth: Payer: Self-pay | Admitting: *Deleted

## 2024-09-22 ENCOUNTER — Ambulatory Visit: Payer: Self-pay | Admitting: Cardiology

## 2024-09-22 NOTE — Telephone Encounter (Signed)
 Copied from CRM [id number]. Topic: Clinical - Lab/Test Results >> Sep 22, 2024  8:09 AM Berwyn MATSU wrote: Reason for CRM: Patient called in to see if PA Grooms received the results of the CT angiogram has been received.   May you please assist.

## 2024-09-24 ENCOUNTER — Telehealth: Payer: Self-pay

## 2024-09-24 NOTE — Telephone Encounter (Signed)
 Spoke with pt regarding abnormal CCTA and Dr. Ranee recommendations to have pt back for office visit next week. Pt has been scheduled to see Dr. Court on 10/13. ED discussed with pt. Pt verbalizes understanding.

## 2024-09-28 ENCOUNTER — Encounter: Payer: Self-pay | Admitting: Cardiovascular Disease

## 2024-09-28 ENCOUNTER — Ambulatory Visit: Attending: Cardiovascular Disease | Admitting: Cardiovascular Disease

## 2024-09-28 VITALS — BP 128/78 | HR 78 | Ht 62.0 in | Wt 157.8 lb

## 2024-09-28 DIAGNOSIS — R0602 Shortness of breath: Secondary | ICD-10-CM

## 2024-09-28 DIAGNOSIS — I10 Essential (primary) hypertension: Secondary | ICD-10-CM

## 2024-09-28 DIAGNOSIS — I5032 Chronic diastolic (congestive) heart failure: Secondary | ICD-10-CM | POA: Diagnosis not present

## 2024-09-28 DIAGNOSIS — I350 Nonrheumatic aortic (valve) stenosis: Secondary | ICD-10-CM

## 2024-09-28 DIAGNOSIS — E782 Mixed hyperlipidemia: Secondary | ICD-10-CM | POA: Diagnosis not present

## 2024-09-28 MED ORDER — POTASSIUM CHLORIDE CRYS ER 20 MEQ PO TBCR: 20.0000 meq | EXTENDED_RELEASE_TABLET | Freq: Every day | ORAL | 3 refills | Status: AC

## 2024-09-28 MED ORDER — FUROSEMIDE 20 MG PO TABS: ORAL_TABLET | ORAL | 3 refills | Status: DC
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
## 2024-09-28 NOTE — Patient Instructions (Addendum)
 Medication Instructions:  Your physician has recommended you make the following change in your medication:   Take Furosemide  (lasix ) 40mg  (2 tablets) by mouth once daily for 3 days, then reduce to 20mg  (1 tablet) once daily.  -Take potassium chloride  20meq daily.  *If you need a refill on your cardiac medications before your next appointment, please call your pharmacy*  Lab Work: Your physician recommends that you have labs drawn today: BMET, CBC & BNP    If you have labs (blood work) drawn today and your tests are completely normal, you will receive your results only by: MyChart Message (if you have MyChart) OR A paper copy in the mail If you have any lab test that is abnormal or we need to change your treatment, we will call you to review the results.  Testing/Procedures: See below  Follow-Up: At [facility], you and your health needs are our priority.  As part of our continuing mission to provide you with exceptional heart care, our providers are all part of one team.  This team includes your primary Cardiologist (physician) and Advanced Practice Providers or APPs (Physician Assistants and Nurse Practitioners) who all work together to provide you with the care you need, when you need it.  Your next appointment:   2-3 week(s) after your procedure (10/20)  Provider:   Dorn Lesches, MD    We recommend signing up for the patient portal called MyChart.  Sign up information is provided on this After Visit Summary.  MyChart is used to connect with patients for Virtual Visits (Telemedicine).  Patients are able to view lab/test results, encounter notes, upcoming appointments, etc.  Non-urgent messages can be sent to your provider as well.   To learn more about what you can do with MyChart, go to [email or web address].   Other Instructions       Cardiac/Peripheral Catheterization   You are scheduled for a Cardiac Catheterization on Monday, October 20 with Dr.  Lonni End.  1. Please arrive at the [facility] (Main Entrance A) at [facility]: [address] [place], [place] [address] at 7:00 AM (This time is 2 hour(s) before your procedure to ensure your preparation).   Free valet parking service is available. You will check in at ADMITTING. The support person will be asked to wait in the waiting room.  It is OK to have someone drop you off and come back when you are ready to be discharged.        Special note: Every effort is made to have your procedure done on time. Please understand that emergencies sometimes delay scheduled procedures.  2. Diet: Nothing to eat after midnight.  3. Hydration:You need to be well hydrated before your procedure. On October 20, you may drink approved liquids (see below) until 2 hours before the procedure, with 16 oz of water as your last intake.   List of approved liquids water, clear juice, clear tea, black coffee, fruit juices, non-citric and without pulp, carbonated beverages, Gatorade, Kool -Aid, plain Jello-O and plain ice popsicles.  4. Labs: You will need to have blood drawn today (10/13).  5. Medication instructions in preparation for your procedure:    Stop taking, Irbesartan  (Avapro )  Monday, October 20,   On the morning of your procedure, take Aspirin 81 mg and any morning medicines NOT listed above.  You may use sips of water.  6. Plan to go home the same day, you will only stay overnight if medically necessary. 7. You  MUST have a responsible adult to drive you home. 8. An adult MUST be with you the first 24 hours after you arrive home. 9. Bring a current list of your medications, and the last time and date medication taken. 10. Bring ID and current insurance cards. 11.Please wear clothes that are easy to get on and off and wear slip-on shoes.  Thank you for allowing us  to care for you!   -- [facility]

## 2024-09-28 NOTE — Assessment & Plan Note (Signed)
 History of chronic diastolic heart failure echo performed 11/03/2018 revealing normal LV systolic function with grade 1 diastolic dysfunction.  She does have 1-2+ pitting edema with increasing dyspnea over the last month.  I am going to begin her on furosemide  40 mg a day for x 3 days then 20 mg a day with potassium repletion.

## 2024-09-28 NOTE — Assessment & Plan Note (Signed)
 Mild aortic stenosis by 2D echo performed 11/03/2018 with a valve area of 1.87 cm with mild to moderate aortic insufficiency as well.

## 2024-09-28 NOTE — Assessment & Plan Note (Signed)
 History of hyperlipidemia on Zetia  with lipid profile performed 06/15/2024 revealing a total cholesterol 191, LDL of 98 and HDL 67.

## 2024-09-28 NOTE — Assessment & Plan Note (Signed)
 Patient has complained of increasing dyspnea exertion for the last 2 to 3 months with some exertional chest pain.  She does have a 2D echo scheduled.  Coronary CTA performed 09/17/2024 revealed a coronary Calcium score 1380 with flow-limiting disease in the proximal LAD and circumflex.  Based on this I decided to proceed with outpatient right left heart catheterization which the patient agrees to.  I have reviewed the risks, indications, and alternatives to cardiac catheterization, possible angioplasty, and stenting with the patient. Risks include but are not limited to bleeding, infection, vascular injury, stroke, myocardial infection, arrhythmia, kidney injury, radiation-related injury in the case of prolonged fluoroscopy use, emergency cardiac surgery, and death. The patient understands the risks of serious complication is 1-2 in 1000 with diagnostic cardiac cath and 1-2% or less with angioplasty/stenting.

## 2024-09-28 NOTE — Assessment & Plan Note (Signed)
 History of essential hypertension with blood pressure measured today at 128/78.  She is on amlodipine , atenolol  and Avapro .
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
## 2024-09-29 ENCOUNTER — Ambulatory Visit (HOSPITAL_COMMUNITY)
Admission: RE | Admit: 2024-09-29 | Discharge: 2024-09-29 | Disposition: A | Discharge status: Home / Self Care | Source: Ambulatory Visit | Attending: Cardiology | Admitting: Cardiology

## 2024-09-29 ENCOUNTER — Ambulatory Visit: Payer: Self-pay | Admitting: Cardiovascular Disease

## 2024-09-29 DIAGNOSIS — I5040 Unspecified combined systolic (congestive) and diastolic (congestive) heart failure: Secondary | ICD-10-CM

## 2024-09-29 DIAGNOSIS — R0602 Shortness of breath: Secondary | ICD-10-CM | POA: Diagnosis not present

## 2024-09-29 DIAGNOSIS — R072 Precordial pain: Secondary | ICD-10-CM | POA: Insufficient documentation

## 2024-09-29 DIAGNOSIS — R600 Localized edema: Secondary | ICD-10-CM

## 2024-09-29 DIAGNOSIS — I2510 Atherosclerotic heart disease of native coronary artery without angina pectoris: Secondary | ICD-10-CM

## 2024-09-29 LAB — ECHOCARDIOGRAM COMPLETE
AR max vel: 1.47 cm2
AV Area VTI: 1.26 cm2
AV Area mean vel: 1.39 cm2
AV Mean grad: 7 mmHg
AV Peak grad: 13.4 mmHg
Ao pk vel: 1.83 m/s
Area-P 1/2: 3.07 cm2
Calc EF: 44.3 %
S' Lateral: 3.34 cm
Single Plane A2C EF: 44.5 %
Single Plane A4C EF: 44.7 %

## 2024-09-29 LAB — BASIC METABOLIC PANEL WITH GFR
BUN/Creatinine Ratio: 20 (ref 12–28)
BUN: 19 mg/dL (ref 10–36)
CO2: 21 mmol/L (ref 20–29)
Calcium: 9.9 mg/dL (ref 8.7–10.3)
Chloride: 100 mmol/L (ref 96–106)
Creatinine, Ser: 0.95 mg/dL (ref 0.57–1.00)
Glucose: 92 mg/dL (ref 70–99)
Potassium: 4.6 mmol/L (ref 3.5–5.2)
Sodium: 139 mmol/L (ref 134–144)
eGFR: 55 mL/min/1.73 — ABNORMAL LOW (ref >59)

## 2024-09-29 LAB — CBC
Hematocrit: 39.1 % (ref 34.0–46.6)
Hemoglobin: 13.2 g/dL (ref 11.1–15.9)
MCH: 33.7 pg — ABNORMAL HIGH (ref 26.6–33.0)
MCHC: 33.8 g/dL (ref 31.5–35.7)
MCV: 100 fL — ABNORMAL HIGH (ref 79–97)
Platelets: 232 x10E3/uL (ref 150–450)
RBC: 3.92 x10E6/uL (ref 3.77–5.28)
RDW: 12.4 % (ref 11.7–15.4)
WBC: 5.9 x10E3/uL (ref 3.4–10.8)

## 2024-09-29 LAB — BRAIN NATRIURETIC PEPTIDE: BNP: 67.1 pg/mL (ref 0.0–100.0)

## 2024-10-01 ENCOUNTER — Telehealth: Payer: Self-pay | Admitting: *Deleted

## 2024-10-01 NOTE — Telephone Encounter (Signed)
 Cardiac Catheterization scheduled at [facility] for: Monday October 05, 2024 9 AM Arrival time [facility] Main Entrance A at: 7 AM  Diet: -Nothing to eat after midnight.  Hydration: -May drink clear liquids until 2 hours before the procedure.  Approved liquids: Water, clear tea, black coffee, fruit juices-non-citric and without pulp,Gatorade, plain Jello/popsicles.   -Please drink 8 oz of water 2 hours before procedure.  Medication instructions: -Hold:  Avapro /Lasix /KCl/Celebrex-AM of procedure  -Other usual morning medications can be taken including aspirin 81 mg.  Plan to go home the same day, you will only stay overnight if medically necessary.  You must have responsible adult to drive you home.  Someone must be with you the first 24 hours after you arrive home.  Reviewed procedure instructions with patient.

## 2024-10-05 ENCOUNTER — Encounter (HOSPITAL_COMMUNITY): Payer: Self-pay | Admitting: Internal Medicine

## 2024-10-05 ENCOUNTER — Other Ambulatory Visit: Payer: Self-pay

## 2024-10-05 ENCOUNTER — Ambulatory Visit (HOSPITAL_COMMUNITY)
Admission: RE | Admit: 2024-10-05 | Discharge: 2024-10-05 | Disposition: A | Discharge status: Home / Self Care | Attending: Internal Medicine | Admitting: Internal Medicine

## 2024-10-05 ENCOUNTER — Encounter (HOSPITAL_COMMUNITY)
Admission: RE | Disposition: A | Discharge status: Home / Self Care | Payer: Self-pay | Source: Home / Self Care | Attending: Internal Medicine

## 2024-10-05 DIAGNOSIS — R0602 Shortness of breath: Secondary | ICD-10-CM | POA: Insufficient documentation

## 2024-10-05 DIAGNOSIS — I110 Hypertensive heart disease with heart failure: Secondary | ICD-10-CM | POA: Insufficient documentation

## 2024-10-05 DIAGNOSIS — I5020 Unspecified systolic (congestive) heart failure: Secondary | ICD-10-CM

## 2024-10-05 DIAGNOSIS — Z7982 Long term (current) use of aspirin: Secondary | ICD-10-CM | POA: Diagnosis not present

## 2024-10-05 DIAGNOSIS — E785 Hyperlipidemia, unspecified: Secondary | ICD-10-CM | POA: Insufficient documentation

## 2024-10-05 DIAGNOSIS — I2584 Coronary atherosclerosis due to calcified coronary lesion: Secondary | ICD-10-CM | POA: Diagnosis not present

## 2024-10-05 DIAGNOSIS — I5042 Chronic combined systolic (congestive) and diastolic (congestive) heart failure: Secondary | ICD-10-CM | POA: Diagnosis not present

## 2024-10-05 DIAGNOSIS — I2510 Atherosclerotic heart disease of native coronary artery without angina pectoris: Secondary | ICD-10-CM | POA: Insufficient documentation

## 2024-10-05 DIAGNOSIS — Z79899 Other long term (current) drug therapy: Secondary | ICD-10-CM | POA: Insufficient documentation

## 2024-10-05 DIAGNOSIS — M069 Rheumatoid arthritis, unspecified: Secondary | ICD-10-CM | POA: Insufficient documentation

## 2024-10-05 DIAGNOSIS — M3500 Sicca syndrome, unspecified: Secondary | ICD-10-CM | POA: Insufficient documentation

## 2024-10-05 DIAGNOSIS — I350 Nonrheumatic aortic (valve) stenosis: Secondary | ICD-10-CM | POA: Diagnosis not present

## 2024-10-05 DIAGNOSIS — R931 Abnormal findings on diagnostic imaging of heart and coronary circulation: Secondary | ICD-10-CM | POA: Diagnosis present

## 2024-10-05 LAB — POCT I-STAT EG7
Acid-base deficit: 1 mmol/L (ref 0.0–2.0)
Bicarbonate: 24.7 mmol/L (ref 20.0–28.0)
Calcium, Ion: 1.25 mmol/L (ref 1.15–1.40)
HCT: 32 % — ABNORMAL LOW (ref 36.0–46.0)
Hemoglobin: 10.9 g/dL — ABNORMAL LOW (ref 12.0–15.0)
O2 Saturation: 68 %
Potassium: 3.9 mmol/L (ref 3.5–5.1)
Sodium: 137 mmol/L (ref 135–145)
TCO2: 26 mmol/L (ref 22–32)
pCO2, Ven: 44.3 mmHg (ref 44–60)
pH, Ven: 7.354 (ref 7.25–7.43)
pO2, Ven: 37 mmHg (ref 32–45)

## 2024-10-05 LAB — POCT I-STAT 7, (LYTES, BLD GAS, ICA,H+H)
Acid-base deficit: 1 mmol/L (ref 0.0–2.0)
Bicarbonate: 23.8 mmol/L (ref 20.0–28.0)
Calcium, Ion: 1.27 mmol/L (ref 1.15–1.40)
HCT: 32 % — ABNORMAL LOW (ref 36.0–46.0)
Hemoglobin: 10.9 g/dL — ABNORMAL LOW (ref 12.0–15.0)
O2 Saturation: 95 %
Potassium: 4 mmol/L (ref 3.5–5.1)
Sodium: 136 mmol/L (ref 135–145)
TCO2: 25 mmol/L (ref 22–32)
pCO2 arterial: 40 mmHg (ref 32–48)
pH, Arterial: 7.383 (ref 7.35–7.45)
pO2, Arterial: 79 mmHg — ABNORMAL LOW (ref 83–108)

## 2024-10-05 SURGERY — RIGHT/LEFT HEART CATH AND CORONARY ANGIOGRAPHY
Anesthesia: LOCAL

## 2024-10-05 MED ORDER — FREE WATER: 250.0000 mL | Freq: Once | Status: DC

## 2024-10-05 MED ORDER — HEPARIN SODIUM (PORCINE) 1000 UNIT/ML IJ SOLN
INTRAMUSCULAR | Status: AC
Start: 2024-10-05 — End: 2024-10-05
  Filled 2024-10-05: qty 10

## 2024-10-05 MED ORDER — LIDOCAINE HCL (PF) 1 % IJ SOLN
INTRAMUSCULAR | Status: DC | PRN
Start: 2024-10-05 — End: 2024-10-05
  Administered 2024-10-05: 2 mL via INTRADERMAL

## 2024-10-05 MED ORDER — SODIUM CHLORIDE 0.9% FLUSH: 3.0000 mL | INTRAVENOUS | Status: DC | PRN

## 2024-10-05 MED ORDER — ONDANSETRON HCL 4 MG/2ML IJ SOLN: 4.0000 mg | Freq: Four times a day (QID) | INTRAMUSCULAR | Status: DC | PRN

## 2024-10-05 MED ORDER — VERAPAMIL HCL 2.5 MG/ML IV SOLN
INTRAVENOUS | Status: AC
  Filled 2024-10-05: qty 2

## 2024-10-05 MED ORDER — SODIUM CHLORIDE 0.9 % IV SOLN: 250.0000 mL | INTRAVENOUS | Status: DC | PRN

## 2024-10-05 MED ORDER — SODIUM CHLORIDE 0.9% FLUSH: 3.0000 mL | Freq: Two times a day (BID) | INTRAVENOUS | Status: DC

## 2024-10-05 MED ORDER — HYDRALAZINE HCL 20 MG/ML IJ SOLN: 10.0000 mg | INTRAMUSCULAR | Status: DC | PRN

## 2024-10-05 MED ORDER — LABETALOL HCL 5 MG/ML IV SOLN: 10.0000 mg | INTRAVENOUS | Status: DC | PRN

## 2024-10-05 MED ORDER — HEPARIN SODIUM (PORCINE) 1000 UNIT/ML IJ SOLN
INTRAMUSCULAR | Status: DC | PRN
  Administered 2024-10-05: 3500 [IU] via INTRAVENOUS

## 2024-10-05 MED ORDER — VERAPAMIL HCL 2.5 MG/ML IV SOLN
INTRAVENOUS | Status: DC | PRN
  Administered 2024-10-05: 10 mL via INTRA_ARTERIAL

## 2024-10-05 MED ORDER — ACETAMINOPHEN 325 MG PO TABS: 650.0000 mg | ORAL_TABLET | ORAL | Status: DC | PRN

## 2024-10-05 MED ORDER — IOHEXOL 350 MG/ML SOLN
INTRAVENOUS | Status: DC | PRN
  Administered 2024-10-05: 42 mL

## 2024-10-05 MED ORDER — ASPIRIN 81 MG PO CHEW: 81.0000 mg | CHEWABLE_TABLET | ORAL | Status: DC

## 2024-10-05 MED ORDER — LIDOCAINE HCL (PF) 1 % IJ SOLN
INTRAMUSCULAR | Status: AC
Start: 2024-10-05 — End: 2024-10-05
  Filled 2024-10-05: qty 30

## 2024-10-05 MED ORDER — FUROSEMIDE 40 MG PO TABS: 40.0000 mg | ORAL_TABLET | Freq: Every day | ORAL | 5 refills | Status: AC

## 2024-10-05 MED ORDER — HEPARIN (PORCINE) IN NACL 1000-0.9 UT/500ML-% IV SOLN
INTRAVENOUS | Status: DC | PRN
  Administered 2024-10-05 (×2): 500 mL

## 2024-10-05 MED ORDER — ISOSORBIDE MONONITRATE ER 30 MG PO TB24: 15.0000 mg | ORAL_TABLET | Freq: Every day | ORAL | 5 refills | Status: AC

## 2024-10-05 MED ORDER — SODIUM CHLORIDE 0.9% FLUSH
3.0000 mL | INTRAVENOUS | Status: DC | PRN
Start: 2024-10-05 — End: 2024-10-05

## 2024-10-05 SURGICAL SUPPLY — 14 items
CATH 5FR JL3.5 JR4 ANG PIG MP (CATHETERS) IMPLANT
CATH BALLN WEDGE 5F 110CM (CATHETERS) IMPLANT
CATH INFINITI 5FR JL4 (CATHETERS) IMPLANT
DEVICE RAD COMP TR BAND LRG (VASCULAR PRODUCTS) IMPLANT
ELECT DEFIB PAD ADLT CADENCE (PAD) IMPLANT
GLIDESHEATH SLEND SS 6F .021 (SHEATH) IMPLANT
GUIDEWIRE INQWIRE 1.5J.035X260 (WIRE) IMPLANT
KIT HEMO VALVE WATCHDOG (MISCELLANEOUS) IMPLANT
KIT SINGLE USE MANIFOLD (KITS) IMPLANT
PACK CARDIAC CATHETERIZATION (CUSTOM PROCEDURE TRAY) ×1 IMPLANT
SET ATX-X65L (MISCELLANEOUS) IMPLANT
SHEATH GLIDE SLENDER 4/5FR (SHEATH) IMPLANT
SHEATH PINNACLE 6F 10CM (SHEATH) IMPLANT
SHEATH PROBE COVER 6X72 (BAG) IMPLANT

## 2024-10-05 NOTE — Progress Notes (Signed)
 TR Band removed, no s/s of complications at incision site. PT ambulated in the room, d/t baseline condition. Was able to void without difficulty. Discharge instructions reviewed with pt and friend Erminio at the bedside denies questions or concerns. IV's removed, pt escorted from the unit via wheel chair to personal vehicle.

## 2024-10-05 NOTE — Discharge Instructions (Signed)

## 2024-10-05 NOTE — Interval H&P Note (Signed)
 History and Physical Interval Note:  10/05/2024 7:45 AM  Jo Parrish  has presented today for surgery, with the diagnosis of shortness of breath and abnormal cardiac CTA.  The various methods of treatment have been discussed with the patient and family. After consideration of risks, benefits and other options for treatment, the patient has consented to  Procedure(s): RIGHT/LEFT HEART CATH AND CORONARY ANGIOGRAPHY (N/A) as a surgical intervention.  The patient's history has been reviewed, patient examined, no change in status, stable for surgery.  I have reviewed the patient's chart and labs.  Questions were answered to the patient's satisfaction.    Cath Lab Visit (complete for each Cath Lab visit)  Clinical Evaluation Leading to the Procedure:   ACS: No.  Non-ACS:    Anginal/Heart Failure Classification: NYHA class IV  Anti-ischemic medical therapy: Maximal Therapy (2 or more classes of medications)  Non-Invasive Test Results: Significant LAD and LCx/OM disease by CT/CT-FFR -> intermediate to high risk  Prior CABG: No previous CABG  Jo Parrish

## 2024-10-15 ENCOUNTER — Ambulatory Visit: Admitting: Podiatry

## 2024-10-15 ENCOUNTER — Encounter: Payer: Self-pay | Admitting: Podiatry

## 2024-10-15 DIAGNOSIS — M79674 Pain in right toe(s): Secondary | ICD-10-CM | POA: Diagnosis not present

## 2024-10-15 DIAGNOSIS — L84 Corns and callosities: Secondary | ICD-10-CM

## 2024-10-15 DIAGNOSIS — M2041 Other hammer toe(s) (acquired), right foot: Secondary | ICD-10-CM | POA: Diagnosis not present

## 2024-10-15 DIAGNOSIS — G629 Polyneuropathy, unspecified: Secondary | ICD-10-CM | POA: Diagnosis not present

## 2024-10-15 DIAGNOSIS — M79675 Pain in left toe(s): Secondary | ICD-10-CM

## 2024-10-15 DIAGNOSIS — B351 Tinea unguium: Secondary | ICD-10-CM | POA: Diagnosis not present

## 2024-10-15 DIAGNOSIS — M2042 Other hammer toe(s) (acquired), left foot: Secondary | ICD-10-CM | POA: Diagnosis not present

## 2024-10-15 NOTE — Progress Notes (Unsigned)
 Subjective:   Patient ID: Jo Parrish, female   DOB: 89 y.o.   MRN: [id number]   HPI Chief Complaint  Patient presents with   Peripheral Neuropathy    Patient is here for neuropathy symptoms and RFC   89 year old female presents the office with above concerns.  She states that her main concern she is having pain to the fifth toe that she thinks started from her compression socks.  She does not recall any injuries.  No swelling.  Her nails are also long and they cause discomfort.  She is not able to trim them.  She also has neuropathy and she has been told not to take gabapentin or Lyrica .  She is been using topical medication with minimal improvement.   Review of Systems  All other systems reviewed and are negative.  Past Medical History:  Diagnosis Date   Bacterial overgrowth syndrome    Small bowel   Complication of anesthesia    Constipation    Cough 09/13/2015   GERD (gastroesophageal reflux disease)    Hematuria 09/13/2015   Hyperlipidemia    Hypertension    Hypothyroidism    Left bundle branch block    Neuropathy    OSA (obstructive sleep apnea)    UNABLE TO TOLERATE  MASK   PONV (postoperative nausea and vomiting)    Rheumatoid arthritis(714.0)    Sjogren's syndrome    UI (urinary incontinence) 01/16/2016   Ulcer, esophagus    Urinary frequency 07/04/2015   UTI (lower urinary tract infection) 09/13/2015   Vaginal atrophy 08/02/2015   Vaginal burning 07/04/2015   Vaginal dryness 07/04/2015   Vulvar itching 08/15/2015   Wheezing on auscultation 09/13/2015    Past Surgical History:  Procedure Laterality Date   APPENDECTOMY     COLONOSCOPY  05/17/2013   Dr. Luis, multiple tubular adenomas removed, internal hemorrhoids. Next colonoscopy in 3 years.   DILATION AND CURETTAGE OF UTERUS     ESOPHAGOGASTRODUODENOSCOPY  06/07/2008    Gastritis. No celiac sprue/Small hiatal hernia   ESOPHAGOGASTRODUODENOSCOPY  05/17/2013   Dr. Luis, erosive esophagitis,  grade B., large hiatal hernia.   Hydrogen breath test  05/17/2008   small bowel bacterial overgrowth/Hydrogen level rise consistent with small bowel bacterial   IR SACROPLASTY BILATERAL  12/13/2022   RIGHT/LEFT HEART CATH AND CORONARY ANGIOGRAPHY N/A 10/05/2024   Procedure: RIGHT/LEFT HEART CATH AND CORONARY ANGIOGRAPHY;  Surgeon: Mady Bruckner, MD;  Location: [facility] INVASIVE CV LAB;  Service: Cardiovascular;  Laterality: N/A;   TONSILLECTOMY     TOTAL KNEE ARTHROPLASTY Right 10/09/2013   Procedure: RIGHT TOTAL KNEE ARTHROPLASTY;  Surgeon: Dempsey LULLA Moan, MD;  Location: [facility] ORS;  Service: Orthopedics;  Laterality: Right;   TRIGGER FINGER RELEASE Right 06/2023   Dr Camella     Current Outpatient Medications:    acetaminophen  (TYLENOL ) 500 MG tablet, Take 1,000 mg by mouth every 8 (eight) hours as needed for moderate pain (pain score 4-6)., Disp: , Rfl:    amLODipine  (NORVASC ) 10 MG tablet, Take 1 tablet by mouth once daily, Disp: 90 tablet, Rfl: 3   aspirin 81 MG tablet, Take 81 mg by mouth daily., Disp: , Rfl:    atenolol  (TENORMIN ) 25 MG tablet, Take 1 tablet (25 mg total) by mouth daily., Disp: 90 tablet, Rfl: 1   Biotin (BIOTIN 5000) 5 MG CAPS, Take 5 mg by mouth daily., Disp: , Rfl:    celecoxib (CELEBREX) 100 MG capsule, Take 100 mg by mouth daily as needed for moderate  pain (pain score 4-6)., Disp: , Rfl:    cholecalciferol (VITAMIN D3) 25 MCG (1000 UNIT) tablet, Take 1,000 Units by mouth daily., Disp: , Rfl:    cyanocobalamin  (VITAMIN B12) 1000 MCG tablet, Take 5,000 mcg by mouth daily., Disp: , Rfl:    diazepam (VALIUM) 10 MG tablet, Take 2.5-5 mg by mouth every 6 (six) hours as needed for anxiety. 1/4 -1/2 of tablet two to three times per week., Disp: , Rfl:    ezetimibe  (ZETIA ) 10 MG tablet, TAKE 1 TABLET BY MOUTH AT BEDTIME, Disp: 90 tablet, Rfl: 3   fluticasone (FLONASE) 50 MCG/ACT nasal spray, Place 2 sprays into both nostrils daily as needed for allergies., Disp: , Rfl:     furosemide  (LASIX ) 40 MG tablet, Take 1 tablet (40 mg total) by mouth daily., Disp: 30 tablet, Rfl: 5   irbesartan  (AVAPRO ) 300 MG tablet, Take 1 tablet by mouth once daily, Disp: 90 tablet, Rfl: 3   isosorbide mononitrate (IMDUR) 30 MG 24 hr tablet, Take 0.5 tablets (15 mg total) by mouth daily., Disp: 30 tablet, Rfl: 5   levothyroxine  (SYNTHROID ) 112 MCG tablet, Take 112 mcg by mouth daily., Disp: , Rfl:    nitroGLYCERIN  (NITROSTAT ) 0.4 MG SL tablet, Place 1 tablet (0.4 mg total) under the tongue every 5 (five) minutes as needed for chest pain., Disp: 25 tablet, Rfl: 4   omeprazole  (PRILOSEC) 40 MG capsule, Take 1 capsule (40 mg total) by mouth daily. Take daily for acid reflux., Disp: 90 capsule, Rfl: 1   ondansetron  (ZOFRAN -ODT) 4 MG disintegrating tablet, Take 1 tablet (4 mg total) by mouth every 8 (eight) hours as needed for nausea or vomiting., Disp: 10 tablet, Rfl: 0   Polyethyl Glycol-Propyl Glycol (SYSTANE OP), Place 1 drop into both eyes daily as needed (dry eyes)., Disp: , Rfl:    polyethylene glycol (MIRALAX  / GLYCOLAX ) 17 g packet, Take 17 g by mouth daily., Disp: , Rfl:    potassium chloride  SA (KLOR-CON  M) 20 MEQ tablet, Take 1 tablet (20 mEq total) by mouth daily., Disp: 90 tablet, Rfl: 3   pyridoxine (B-6) 100 MG tablet, Take 100 mg by mouth daily., Disp: , Rfl:   Allergies  Allergen Reactions   Dicyclomine Swelling    Patient reported tongue swelling when taking   Amoxicillin     Mouth sores   Cymbalta  [Duloxetine  Hcl]    Doxycycline     Mouth sores   Gabapentin     Mouth sores   Levaquin [Levofloxacin In D5w]     Mouth sores   Lyrica  [Pregabalin ]     Mouth sores   Prednisone      Mouth sores   Statins Other (See Comments)    Leg problems   Sucralfate     Sore mouth   Sulfa Antibiotics Other (See Comments)    Mouth sores   Tramadol  Other (See Comments)    Pt prefers to not be prescribed this medication again. It previously caused her to become confused, fatigued  and have hair loss.   Fenofibrate  Micronized Other (See Comments)    unknown   Tizanidine Other (See Comments)    Dry mouth, slurred speech          Objective:  Physical Exam  General: AAO x3, NAD  Dermatological: Nails are hypertrophic, dystrophic, brittle, discolored, elongated 10. No surrounding redness or drainage. Tenderness nails 1-5 bilaterally. No open lesions noted bilaterally.  There is a preulcerative area on the medial aspect of the right fifth  toe where she gets tender.  She also has tenderness on that toenail on the fifth digit in particular as it is getting elongated.  No drainage or pus or open lesions.   Vascular: Dorsalis Pedis artery and Posterior Tibial artery pedal pulses are 2/4 bilateral with immedate capillary fill time. There is no pain with calf compression, swelling, warmth, erythema.   Neruologic: Grossly intact via light touch bilateral.  Sensation intact this is warm to monofilament  Musculoskeletal: Digital contractures mostly noted to the 4th and 5th toes.       Assessment:   Neuropathy  Dermatophytosis of nail  Pain in toes of both feet  Hammertoes of both feet  Pre-ulcerative calluses      Plan:  -Treatment options discussed including all alternatives, risks, and complications -Etiology of symptoms were discussed  Right foot preulcerative lesion -I dispensed offloading pads.  Discussed she is to wear excess pressure  Symptomatic onychomycosis -Debrided nails x 10 without complications or bleeding  Neuropathy -This is symptomatic.  He is not able to take oral medication.  She can use topical medication with minimal improvement.  Discussed other options including Qutenza.  Will submit for authorization.    Jo Parrish DPM

## 2024-10-15 NOTE — Patient Instructions (Signed)
 For topical creams I like capsaicin cream, topical lidocaine , Biofreeze

## 2024-10-16 ENCOUNTER — Ambulatory Visit (HOSPITAL_COMMUNITY)

## 2024-10-21 ENCOUNTER — Encounter: Payer: Self-pay | Admitting: Cardiovascular Disease

## 2024-10-21 ENCOUNTER — Ambulatory Visit: Attending: Cardiology | Admitting: Cardiovascular Disease

## 2024-10-21 VITALS — BP 128/60 | HR 65 | Ht 62.0 in | Wt 152.4 lb

## 2024-10-21 DIAGNOSIS — I2510 Atherosclerotic heart disease of native coronary artery without angina pectoris: Secondary | ICD-10-CM | POA: Insufficient documentation

## 2024-10-21 DIAGNOSIS — I350 Nonrheumatic aortic (valve) stenosis: Secondary | ICD-10-CM | POA: Diagnosis not present

## 2024-10-21 DIAGNOSIS — I5032 Chronic diastolic (congestive) heart failure: Secondary | ICD-10-CM

## 2024-10-21 DIAGNOSIS — I25119 Atherosclerotic heart disease of native coronary artery with unspecified angina pectoris: Secondary | ICD-10-CM | POA: Diagnosis not present

## 2024-10-21 NOTE — Assessment & Plan Note (Signed)
 Chronic systolic and diastolic heart failure.  She is on appropriate medications.  Her last echo performed 09/29/2024 showed an ejection fraction of 40 to 45% with global hypokinesia in the and a grade 1 diastolic dysfunction with mild MR and moderate AI.  This is in contrast to an echo performed 11/03/2018 at which time her ejection fraction was normal.  The dyspnea that she was complaining of when I saw her 6 weeks ago has significantly improved.  Her lower extreme edema has improved as well on furosemide .

## 2024-10-21 NOTE — Patient Instructions (Signed)
 Medication Instructions:  Your physician recommends that you continue on your current medications as directed. Please refer to the Current Medication list given to you today.  *If you need a refill on your cardiac medications before your next appointment, please call your pharmacy*   Follow-Up: At [facility], you and your health needs are our priority.  As part of our continuing mission to provide you with exceptional heart care, our providers are all part of one team.  This team includes your primary Cardiologist (physician) and Advanced Practice Providers or APPs (Physician Assistants and Nurse Practitioners) who all work together to provide you with the care you need, when you need it.  Your next appointment:   3 month(s)  Provider:   Hao Meng, PA-C or Delon Hoover, NP         Then, Dorn Lesches, MD will plan to see you again in 6 month(s).     We recommend signing up for the patient portal called MyChart.  Sign up information is provided on this After Visit Summary.  MyChart is used to connect with patients for Virtual Visits (Telemedicine).  Patients are able to view lab/test results, encounter notes, upcoming appointments, etc.  Non-urgent messages can be sent to your provider as well.   To learn more about what you can do with MyChart, go to [email or web address].

## 2024-10-21 NOTE — Assessment & Plan Note (Addendum)
 Jo Parrish returns today for follow-up after her outpatient cath.  I last saw her 09/28/2024 when she is complaining of several months of exertional substernal chest heaviness along with dyspnea.  She did have a coronary CTA revealing a coronary calcium score of 1380 with FFR analysis showing significant disease in his circumflex obtuse marginal branch and LAD.  This led to an outpatient right left heart cath by Dr. Mady 10/05/2024 which revealed significant two-vessel disease with 80% proximal calcified LAD involving D1 and sequential disease in the AV groove circumflex and obtuse marginal branch 1.  LVEDP was mildly elevated at 20.  Her aortic valve gradient was 13 mean with a valve area of 1.2 cm.  She was begun on Imdur.  Her chest heaviness has resolved.  Will continue to treat her medically.

## 2024-10-21 NOTE — Telephone Encounter (Signed)
 See message - results were given to patient from Cardiology- ordering and managing provider
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
## 2024-10-21 NOTE — Assessment & Plan Note (Signed)
 Recent 2D echo performed 09/29/2024 showed aortic sclerosis without stenosis.  She did have moderate aortic insufficiency.

## 2024-10-26 ENCOUNTER — Telehealth: Payer: Self-pay | Admitting: Cardiovascular Disease

## 2024-10-26 NOTE — Telephone Encounter (Signed)
 Pt c/o Syncope: STAT if syncope occurred within 24 hours and pt complains of lightheadedness  Did you pass out today? yes    When is the last time you passed out?  Today, passed out for over 5 minutes. She was with someone that was being treated at the [facility] in [place] and a nurse came over and helped her and told her to call her doctor.   Has this occurred multiple times?  Just once   Did you have any symptoms prior to passing out?  Vision was blurry prior to passing out, woke up with saliva running out her mouth   5. Did you fall? If so, are you on a blood thinner? no
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
## 2024-10-27 ENCOUNTER — Ambulatory Visit (INDEPENDENT_AMBULATORY_CARE_PROVIDER_SITE_OTHER)

## 2024-10-27 ENCOUNTER — Ambulatory Visit: Attending: Cardiovascular Disease | Admitting: Cardiovascular Disease

## 2024-10-27 ENCOUNTER — Encounter: Payer: Self-pay | Admitting: Cardiovascular Disease

## 2024-10-27 VITALS — BP 128/64 | HR 78 | Resp 16 | Ht 62.0 in | Wt 154.0 lb

## 2024-10-27 DIAGNOSIS — I5032 Chronic diastolic (congestive) heart failure: Secondary | ICD-10-CM

## 2024-10-27 DIAGNOSIS — R55 Syncope and collapse: Secondary | ICD-10-CM

## 2024-10-27 NOTE — Progress Notes (Unsigned)
 Applied a 14 day Zio XT monitor to patient in the office ?

## 2024-10-27 NOTE — Progress Notes (Signed)
 Ms. Amalfitano returns today for follow-up.  I last saw her a week ago.  She is accompanied by a friend Vinie Potters today.  She was apparently accompanying him in his car for doctors appointment at the [facility] when she lost consciousness for approximately 5 minutes.  She said prior to that she her vision was starting to deteriorate.  When she woke up she had saliva in her mouth.  There was no obvious seizure activity.  She denies chest pain or shortness of breath.  Her vital signs are stable today.  Her EKG shows left bundle which is old.  It is unclear the etiology.  I am going to get a basic metabolic panel since she is on a diuretic to make sure her electrolytes are normal as well as a 2-week Zio patch.  I have counseled her about not driving for 6 months.  She will see an APP back in 3 months and me back in 6 months.  Dorn DOROTHA Lesches, M.D., FACP, [facility], FAHA, [facility]  [address] [place], [place]  [address]  [phone] 10/27/2024 2:28 PM

## 2024-10-27 NOTE — Patient Instructions (Addendum)
 You can not drive for 6 months   Medication Instructions:  No changes  *If you need a refill on your cardiac medications before your next appointment, please call your pharmacy*   Lab Work: BMP If you have labs (blood work) drawn today and your tests are completely normal, you will receive your results only by: MyChart Message (if you have MyChart) OR A paper copy in the mail If you have any lab test that is abnormal or we need to change your treatment, we will call you to review the results.   Testing/Procedures:  .Your physician has recommended that you wear a holter monitor 14 day Zio . Holter monitors are medical devices that record the heart's electrical activity. Doctors most often use these monitors to diagnose arrhythmias. Arrhythmias are problems with the speed or rhythm of the heartbeat. The monitor is a small, portable device. You can wear one while you do your normal daily activities. This is usually used to diagnose what is causing palpitations/syncope (passing out).   Follow-Up: At [facility], you and your health needs are our priority.  As part of our continuing mission to provide you with exceptional heart care, we have created designated Provider Care Teams.  These Care Teams include your primary Cardiologist (physician) and Advanced Practice Providers (APPs -  Physician Assistants and Nurse Practitioners) who all work together to provide you with the care you need, when you need it.     Your next appointment:   3 month(s)  The format for your next appointment:   In Person  Provider:   Damien Braver NP , Katlyn West NP   and then Dorn Lesches, MD then 6 month   ZIO XT- Long Term Monitor Instructions  Your physician has requested you wear a ZIO patch monitor for 14 days.  This is a single patch monitor. [employer] supplies one patch monitor per enrollment. Additional stickers are not available. Please do not apply patch if you will be having a Nuclear Stress  Test,  Echocardiogram, Cardiac CT, MRI, or Chest Xray during the period you would be wearing the  monitor. The patch cannot be worn during these tests. You cannot remove and re-apply the  ZIO XT patch monitor.  Your ZIO patch monitor will be mailed 3 day USPS to your address on file. It may take 3-5 days  to receive your monitor after you have been enrolled.  Once you have received your monitor, please review the enclosed instructions. Your monitor  has already been registered assigning a specific monitor serial # to you.  Billing and Patient Assistance Program Information  We have supplied [employer] with any of your insurance information on file for billing purposes. [employer] offers a sliding scale Patient Assistance Program for patients that do not have  insurance, or whose insurance does not completely cover the cost of the ZIO monitor.  You must apply for the Patient Assistance Program to qualify for this discounted rate.  To apply, please call [employer] at [phone], select option 4, select option 2, ask to apply for  Patient Assistance Program. M[place]eth will ask your household income, and how many people  are in your household. They will quote your out-of-pocket cost based on that information.  [employer] will also be able to set up a 57-month, interest-free payment plan if needed.  Applying the monitor   Shave hair from upper left chest.  Hold abrader disc by orange tab. Rub abrader in 40 strokes over the upper left chest as  indicated in your monitor instructions.  Clean area with 4 enclosed alcohol pads. Let dry.  Apply patch as indicated in monitor instructions. Patch will be placed under collarbone on left  side of chest with arrow pointing upward.  Rub patch adhesive wings for 2 minutes. Remove white label marked 1. Remove the white  label marked 2. Rub patch adhesive wings for 2 additional minutes.  While looking in a mirror, press and release button in center of patch. A  small green light will  flash 3-4 times. This will be your only indicator that the monitor has been turned on.  Do not shower for the first 24 hours. You may shower after the first 24 hours.  Press the button if you feel a symptom. You will hear a small click. Record Date, Time and  Symptom in the Patient Logbook.  When you are ready to remove the patch, follow instructions on the last 2 pages of Patient  Logbook. Stick patch monitor onto the last page of Patient Logbook.  Place Patient Logbook in the blue and white box. Use locking tab on box and tape box closed  securely. The blue and white box has prepaid postage on it. Please place it in the mailbox as  soon as possible. Your physician should have your test results approximately 7 days after the  monitor has been mailed back to [facility].  Call [facility] Customer Care at [phone] if you have questions regarding  your ZIO XT patch monitor. Call them immediately if you see an orange light blinking on your  monitor.  If your monitor falls off in less than 4 days, contact our Monitor department at [phone].  If your monitor becomes loose or falls off after 4 days call [employer] at [phone] for  suggestions on securing your monitor

## 2024-10-28 ENCOUNTER — Telehealth: Payer: Self-pay | Admitting: Physician Assistant

## 2024-10-28 ENCOUNTER — Ambulatory Visit: Payer: Self-pay | Admitting: Cardiovascular Disease

## 2024-10-28 ENCOUNTER — Other Ambulatory Visit: Payer: Self-pay | Admitting: Physician Assistant

## 2024-10-28 DIAGNOSIS — M5416 Radiculopathy, lumbar region: Secondary | ICD-10-CM | POA: Diagnosis not present

## 2024-10-28 LAB — BASIC METABOLIC PANEL WITH GFR
BUN/Creatinine Ratio: 23 (ref 12–28)
BUN: 28 mg/dL (ref 10–36)
CO2: 23 mmol/L (ref 20–29)
Calcium: 9.5 mg/dL (ref 8.7–10.3)
Chloride: 104 mmol/L (ref 96–106)
Creatinine, Ser: 1.2 mg/dL — ABNORMAL HIGH (ref 0.57–1.00)
Glucose: 89 mg/dL (ref 70–99)
Potassium: 4.4 mmol/L (ref 3.5–5.2)
Sodium: 140 mmol/L (ref 134–144)
eGFR: 41 mL/min/1.73 — ABNORMAL LOW (ref >59)

## 2024-10-28 MED ORDER — LEVOTHYROXINE SODIUM 112 MCG PO TABS: 112.0000 ug | ORAL_TABLET | Freq: Every day | ORAL | 1 refills | Status: AC

## 2024-10-28 NOTE — Telephone Encounter (Signed)
 Called patient to left her a voicemail stating her medication has been refilled.

## 2024-10-28 NOTE — Telephone Encounter (Signed)
 Letter of results sent to pt

## 2024-10-28 NOTE — Telephone Encounter (Signed)
 Patient came by office needs refill on levothyroxine  (SYNTHROID ) 112 MCG tablet [[id number]]  Patient only has 6 pills left.   Last given by [facility] now sees [place]   Pharmacy: [facility]

## 2024-11-05 ENCOUNTER — Ambulatory Visit (INDEPENDENT_AMBULATORY_CARE_PROVIDER_SITE_OTHER): Admitting: Gastroenterology

## 2024-11-05 DIAGNOSIS — H43813 Vitreous degeneration, bilateral: Secondary | ICD-10-CM | POA: Diagnosis not present

## 2024-11-17 DIAGNOSIS — R55 Syncope and collapse: Secondary | ICD-10-CM | POA: Diagnosis not present

## 2024-11-17 DIAGNOSIS — I5032 Chronic diastolic (congestive) heart failure: Secondary | ICD-10-CM | POA: Diagnosis not present

## 2024-11-18 DIAGNOSIS — M79671 Pain in right foot: Secondary | ICD-10-CM | POA: Diagnosis not present

## 2024-11-18 DIAGNOSIS — L97511 Non-pressure chronic ulcer of other part of right foot limited to breakdown of skin: Secondary | ICD-10-CM | POA: Diagnosis not present

## 2024-11-19 ENCOUNTER — Ambulatory Visit: Admitting: Podiatry

## 2024-11-19 DIAGNOSIS — L03115 Cellulitis of right lower limb: Secondary | ICD-10-CM | POA: Diagnosis not present

## 2024-11-20 DIAGNOSIS — R55 Syncope and collapse: Secondary | ICD-10-CM | POA: Diagnosis not present

## 2024-11-20 DIAGNOSIS — M79671 Pain in right foot: Secondary | ICD-10-CM | POA: Diagnosis not present

## 2024-11-20 DIAGNOSIS — I5032 Chronic diastolic (congestive) heart failure: Secondary | ICD-10-CM | POA: Diagnosis not present

## 2024-11-27 DIAGNOSIS — L03115 Cellulitis of right lower limb: Secondary | ICD-10-CM | POA: Diagnosis not present

## 2024-12-16 ENCOUNTER — Ambulatory Visit: Admitting: Physician Assistant

## 2024-12-28 ENCOUNTER — Encounter (HOSPITAL_BASED_OUTPATIENT_CLINIC_OR_DEPARTMENT_OTHER): Admitting: Internal Medicine

## 2025-01-15 ENCOUNTER — Encounter: Admitting: Physician Assistant

## 2025-01-21 ENCOUNTER — Ambulatory Visit: Admitting: Cardiology

## 2025-01-25 ENCOUNTER — Ambulatory Visit: Admitting: Cardiovascular Disease

## 2025-01-28 ENCOUNTER — Encounter: Admitting: Physician Assistant

## 2025-02-03 ENCOUNTER — Encounter: Admitting: Physician Assistant

## 2025-02-09 ENCOUNTER — Ambulatory Visit (INDEPENDENT_AMBULATORY_CARE_PROVIDER_SITE_OTHER): Admitting: Gastroenterology
